# Patient Record
Sex: Female | Born: 1935 | Race: White | Hispanic: No | Smoking: Current every day smoker
Health system: Southern US, Community
[De-identification: ages and names within clinical notes are randomized; demographics above are authoritative.]

## PROBLEM LIST (undated history)

## (undated) DIAGNOSIS — D751 Secondary polycythemia: Secondary | ICD-10-CM

## (undated) DIAGNOSIS — M1990 Unspecified osteoarthritis, unspecified site: Secondary | ICD-10-CM

## (undated) DIAGNOSIS — E039 Hypothyroidism, unspecified: Secondary | ICD-10-CM

## (undated) DIAGNOSIS — E669 Obesity, unspecified: Secondary | ICD-10-CM

## (undated) DIAGNOSIS — I10 Essential (primary) hypertension: Secondary | ICD-10-CM

## (undated) DIAGNOSIS — E038 Other specified hypothyroidism: Secondary | ICD-10-CM

## (undated) DIAGNOSIS — G9050 Complex regional pain syndrome I, unspecified: Secondary | ICD-10-CM

## (undated) HISTORY — DX: Other specified hypothyroidism: E03.8

## (undated) HISTORY — DX: Secondary polycythemia: D75.1

## (undated) HISTORY — DX: Obesity, unspecified: E66.9

## (undated) HISTORY — DX: Complex regional pain syndrome I, unspecified: G90.50

## (undated) HISTORY — DX: Unspecified osteoarthritis, unspecified site: M19.90

## (undated) HISTORY — DX: Hypothyroidism, unspecified: E03.9

## (undated) HISTORY — DX: Essential (primary) hypertension: I10

---

## 2003-04-01 ENCOUNTER — Encounter: Payer: Self-pay | Admitting: Family Medicine

## 2003-04-01 ENCOUNTER — Ambulatory Visit (HOSPITAL_COMMUNITY): Admission: RE | Admit: 2003-04-01 | Discharge: 2003-04-01 | Payer: Self-pay | Admitting: Family Medicine

## 2003-09-19 LAB — HM COLONOSCOPY: HM Colonoscopy: NEGATIVE

## 2004-05-27 ENCOUNTER — Ambulatory Visit: Payer: Self-pay | Admitting: Family Medicine

## 2004-09-21 ENCOUNTER — Ambulatory Visit: Payer: Self-pay | Admitting: Family Medicine

## 2004-12-30 ENCOUNTER — Ambulatory Visit: Payer: Self-pay | Admitting: Family Medicine

## 2005-05-10 ENCOUNTER — Ambulatory Visit: Payer: Self-pay | Admitting: Family Medicine

## 2005-05-19 ENCOUNTER — Other Ambulatory Visit: Admission: RE | Admit: 2005-05-19 | Discharge: 2005-05-19 | Payer: Self-pay | Admitting: Dermatology

## 2005-06-14 ENCOUNTER — Ambulatory Visit: Payer: Self-pay | Admitting: Family Medicine

## 2005-09-16 ENCOUNTER — Ambulatory Visit: Payer: Self-pay | Admitting: Family Medicine

## 2005-10-19 ENCOUNTER — Ambulatory Visit: Payer: Self-pay | Admitting: Family Medicine

## 2005-11-18 ENCOUNTER — Ambulatory Visit: Payer: Self-pay | Admitting: Family Medicine

## 2006-02-16 ENCOUNTER — Ambulatory Visit: Payer: Self-pay | Admitting: Family Medicine

## 2006-03-08 ENCOUNTER — Ambulatory Visit: Payer: Self-pay | Admitting: Family Medicine

## 2006-05-15 ENCOUNTER — Ambulatory Visit: Payer: Self-pay | Admitting: Family Medicine

## 2006-11-10 ENCOUNTER — Encounter: Admission: RE | Admit: 2006-11-10 | Discharge: 2006-11-10 | Payer: Self-pay | Admitting: Rheumatology

## 2009-04-13 LAB — HM DEXA SCAN: HM Dexa Scan: NORMAL

## 2011-07-21 ENCOUNTER — Other Ambulatory Visit: Payer: Self-pay | Admitting: Family Medicine

## 2011-07-21 DIAGNOSIS — I10 Essential (primary) hypertension: Secondary | ICD-10-CM

## 2011-07-21 DIAGNOSIS — Z87891 Personal history of nicotine dependence: Secondary | ICD-10-CM

## 2011-07-26 ENCOUNTER — Ambulatory Visit
Admission: RE | Admit: 2011-07-26 | Discharge: 2011-07-26 | Disposition: A | Discharge status: Home / Self Care | Payer: Medicare Other | Source: Ambulatory Visit | Attending: Family Medicine | Admitting: Family Medicine

## 2011-07-26 DIAGNOSIS — Z87891 Personal history of nicotine dependence: Secondary | ICD-10-CM

## 2011-07-26 DIAGNOSIS — I10 Essential (primary) hypertension: Secondary | ICD-10-CM

## 2011-08-25 LAB — HM MAMMOGRAPHY

## 2012-08-16 DIAGNOSIS — G9050 Complex regional pain syndrome I, unspecified: Secondary | ICD-10-CM

## 2012-08-16 HISTORY — DX: Complex regional pain syndrome I, unspecified: G90.50

## 2012-09-18 ENCOUNTER — Encounter: Payer: Self-pay | Admitting: Family Medicine

## 2012-09-18 DIAGNOSIS — F17200 Nicotine dependence, unspecified, uncomplicated: Secondary | ICD-10-CM

## 2012-09-18 DIAGNOSIS — I10 Essential (primary) hypertension: Secondary | ICD-10-CM

## 2012-09-18 DIAGNOSIS — E669 Obesity, unspecified: Secondary | ICD-10-CM

## 2012-09-18 DIAGNOSIS — D751 Secondary polycythemia: Secondary | ICD-10-CM

## 2012-09-18 DIAGNOSIS — R7989 Other specified abnormal findings of blood chemistry: Secondary | ICD-10-CM

## 2012-09-18 DIAGNOSIS — M170 Bilateral primary osteoarthritis of knee: Secondary | ICD-10-CM

## 2012-09-18 DIAGNOSIS — G9050 Complex regional pain syndrome I, unspecified: Secondary | ICD-10-CM

## 2012-10-02 ENCOUNTER — Telehealth: Payer: Self-pay | Admitting: Family Medicine

## 2012-10-02 MED ORDER — MELOXICAM 15 MG PO TABS: 15.0000 mg | ORAL_TABLET | Freq: Every day | ORAL | Status: DC

## 2012-10-02 NOTE — Telephone Encounter (Signed)
Refilled 10/02/12 by Quentin Angst

## 2012-10-02 NOTE — Telephone Encounter (Signed)
Needs refill on mobic.  [facility]

## 2012-11-13 ENCOUNTER — Encounter: Payer: Self-pay | Admitting: Family Medicine

## 2012-11-13 ENCOUNTER — Ambulatory Visit (INDEPENDENT_AMBULATORY_CARE_PROVIDER_SITE_OTHER): Payer: Medicare Other | Admitting: Family Medicine

## 2012-11-13 VITALS — BP 141/71 | HR 57 | Temp 97.0°F | Wt 211.2 lb

## 2012-11-13 DIAGNOSIS — G5600 Carpal tunnel syndrome, unspecified upper limb: Secondary | ICD-10-CM

## 2012-11-13 DIAGNOSIS — G5601 Carpal tunnel syndrome, right upper limb: Secondary | ICD-10-CM

## 2012-11-13 DIAGNOSIS — I10 Essential (primary) hypertension: Secondary | ICD-10-CM

## 2012-11-13 DIAGNOSIS — M1710 Unilateral primary osteoarthritis, unspecified knee: Secondary | ICD-10-CM

## 2012-11-13 DIAGNOSIS — F329 Major depressive disorder, single episode, unspecified: Secondary | ICD-10-CM

## 2012-11-13 DIAGNOSIS — M170 Bilateral primary osteoarthritis of knee: Secondary | ICD-10-CM

## 2012-11-13 DIAGNOSIS — D751 Secondary polycythemia: Secondary | ICD-10-CM

## 2012-11-13 DIAGNOSIS — E669 Obesity, unspecified: Secondary | ICD-10-CM

## 2012-11-13 DIAGNOSIS — F3289 Other specified depressive episodes: Secondary | ICD-10-CM

## 2012-11-13 DIAGNOSIS — F17200 Nicotine dependence, unspecified, uncomplicated: Secondary | ICD-10-CM

## 2012-11-13 DIAGNOSIS — F32A Depression, unspecified: Secondary | ICD-10-CM | POA: Insufficient documentation

## 2012-11-13 LAB — POCT CBC
Granulocyte percent: 73.5 %G (ref 37–80)
HCT, POC: 48.2 % — AB (ref 37.7–47.9)
Hemoglobin: 15.9 g/dL (ref 12.2–16.2)
Lymph, poc: 3 (ref 0.6–3.4)
MCH, POC: 29.4 pg (ref 27–31.2)
MCHC: 33 g/dL (ref 31.8–35.4)
MCV: 89 fL (ref 80–97)
MPV: 8.1 fL (ref 0–99.8)
POC Granulocyte: 9.2 — AB (ref 2–6.9)
POC LYMPH PERCENT: 24.1 %L (ref 10–50)
Platelet Count, POC: 328 10*3/uL (ref 142–424)
RBC: 5.4 M/uL (ref 4.04–5.48)
RDW, POC: 14.4 %
WBC: 12.5 10*3/uL — AB (ref 4.6–10.2)

## 2012-11-13 LAB — COMPLETE METABOLIC PANEL WITH GFR
ALT: 12 U/L (ref 0–35)
AST: 12 U/L (ref 0–37)
Albumin: 4.1 g/dL (ref 3.5–5.2)
Alkaline Phosphatase: 86 U/L (ref 39–117)
BUN: 16 mg/dL (ref 6–23)
CO2: 25 mEq/L (ref 19–32)
Calcium: 10.5 mg/dL (ref 8.4–10.5)
Chloride: 101 mEq/L (ref 96–112)
Creat: 0.68 mg/dL (ref 0.50–1.10)
GFR, Est African American: >89 mL/min
GFR, Est Non African American: 86 mL/min
Glucose, Bld: 99 mg/dL (ref 70–99)
Potassium: 4.3 mEq/L (ref 3.5–5.3)
Sodium: 138 mEq/L (ref 135–145)
Total Bilirubin: 0.5 mg/dL (ref 0.3–1.2)
Total Protein: 6.8 g/dL (ref 6.0–8.3)

## 2012-11-13 NOTE — Patient Instructions (Signed)
Smoking Cessation Quitting smoking is important to your health and has many advantages. However, it is not always easy to quit since nicotine is a very addictive drug. Often times, people try 3 times or more before being able to quit. This document explains the best ways for you to prepare to quit smoking. Quitting takes hard work and a lot of effort, but you can do it. ADVANTAGES OF QUITTING SMOKING  You will live longer, feel better, and live better.  Your body will feel the impact of quitting smoking almost immediately.  Within 20 minutes, blood pressure decreases. Your pulse returns to its normal level.  After 8 hours, carbon monoxide levels in the blood return to normal. Your oxygen level increases.  After 24 hours, the chance of having a heart attack starts to decrease. Your breath, hair, and body stop smelling like smoke.  After 48 hours, damaged nerve endings begin to recover. Your sense of taste and smell improve.  After 72 hours, the body is virtually free of nicotine. Your bronchial tubes relax and breathing becomes easier.  After 2 to 12 weeks, lungs can hold more air. Exercise becomes easier and circulation improves.  The risk of having a heart attack, stroke, cancer, or lung disease is greatly reduced.  After 1 year, the risk of coronary heart disease is cut in half.  After 5 years, the risk of stroke falls to the same as a nonsmoker.  After 10 years, the risk of lung cancer is cut in half and the risk of other cancers decreases significantly.  After 15 years, the risk of coronary heart disease drops, usually to the level of a nonsmoker.  If you are pregnant, quitting smoking will improve your chances of having a healthy baby.  The people you live with, especially any children, will be healthier.  You will have extra money to spend on things other than cigarettes. QUESTIONS TO THINK ABOUT BEFORE ATTEMPTING TO QUIT You may want to talk about your answers with your  caregiver.  Why do you want to quit?  If you tried to quit in the past, what helped and what did not?  What will be the most difficult situations for you after you quit? How will you plan to handle them?  Who can help you through the tough times? Your family? Friends? A caregiver?  What pleasures do you get from smoking? What ways can you still get pleasure if you quit? Here are some questions to ask your caregiver:  How can you help me to be successful at quitting?  What medicine do you think would be best for me and how should I take it?  What should I do if I need more help?  What is smoking withdrawal like? How can I get information on withdrawal? GET READY  Set a quit date.  Change your environment by getting rid of all cigarettes, ashtrays, matches, and lighters in your home, car, or work. Do not let people smoke in your home.  Review your past attempts to quit. Think about what worked and what did not. GET SUPPORT AND ENCOURAGEMENT You have a better chance of being successful if you have help. You can get support in many ways.  Tell your family, friends, and co-workers that you are going to quit and need their support. Ask them not to smoke around you.  Get individual, group, or telephone counseling and support. Programs are available at local hospitals and health centers. Call your local health department for   information about programs in your area.  Spiritual beliefs and practices may help some smokers quit.  Download a "quit meter" on your computer to keep track of quit statistics, such as how long you have gone without smoking, cigarettes not smoked, and money saved.  Get a self-help book about quitting smoking and staying off of tobacco. LEARN NEW SKILLS AND BEHAVIORS  Distract yourself from urges to smoke. Talk to someone, go for a walk, or occupy your time with a task.  Change your normal routine. Take a different route to work. Drink tea instead of coffee.  Eat breakfast in a different place.  Reduce your stress. Take a hot bath, exercise, or read a book.  Plan something enjoyable to do every day. Reward yourself for not smoking.  Explore interactive web-based programs that specialize in helping you quit. GET MEDICINE AND USE IT CORRECTLY Medicines can help you stop smoking and decrease the urge to smoke. Combining medicine with the above behavioral methods and support can greatly increase your chances of successfully quitting smoking.  Nicotine replacement therapy helps deliver nicotine to your body without the negative effects and risks of smoking. Nicotine replacement therapy includes nicotine gum, lozenges, inhalers, nasal sprays, and skin patches. Some may be available over-the-counter and others require a prescription.  Antidepressant medicine helps people abstain from smoking, but how this works is unknown. This medicine is available by prescription.  Nicotinic receptor partial agonist medicine simulates the effect of nicotine in your brain. This medicine is available by prescription. Ask your caregiver for advice about which medicines to use and how to use them based on your health history. Your caregiver will tell you what side effects to look out for if you choose to be on a medicine or therapy. Carefully read the information on the package. Do not use any other product containing nicotine while using a nicotine replacement product.  RELAPSE OR DIFFICULT SITUATIONS Most relapses occur within the first 3 months after quitting. Do not be discouraged if you start smoking again. Remember, most people try several times before finally quitting. You may have symptoms of withdrawal because your body is used to nicotine. You may crave cigarettes, be irritable, feel very hungry, cough often, get headaches, or have difficulty concentrating. The withdrawal symptoms are only temporary. They are strongest when you first quit, but they will go away within  10 14 days. To reduce the chances of relapse, try to:  Avoid drinking alcohol. Drinking lowers your chances of successfully quitting.  Reduce the amount of caffeine you consume. Once you quit smoking, the amount of caffeine in your body increases and can give you symptoms, such as a rapid heartbeat, sweating, and anxiety.  Avoid smokers because they can make you want to smoke.  Do not let weight gain distract you. Many smokers will gain weight when they quit, usually less than 10 pounds. Eat a healthy diet and stay active. You can always lose the weight gained after you quit.  Find ways to improve your mood other than smoking. FOR MORE INFORMATION  www.smokefree.gov  Document Released: 06/21/2001 Document Revised: 12/27/2011 Document Reviewed: 10/06/2011 [employer] Patient Information 2013 [employer].  

## 2012-11-13 NOTE — Progress Notes (Signed)
Subjective:     Patient ID: Erin Ewing, female   DOB: 1936-04-11, 77 y.o.   MRN: [id number]  HPI Patient is here for follow up of hypertension: denies Headache;deniesChest Pain;denies weakness;denies Shortness of Breath or Orthopnea;denies Visual changes;denies palpitations;denies cough;denies pedal edema;denies symptoms of TIA or stroke; admits to Compliance with medications.  admits to Problems with medications.BP was too low and  Stopped the pm amlodipine.  Saw Dr Jordan Likes at pain management: Dx is NOT RSD. It is CTS. Had an injection into the right Carpal tunnel area and it is numb, less pain.  Sleeping a lot, Depressed. She was told to stop citalopram but needs to restart. Using lyrica.did not see any effect and stopped it 2 months ago.  Past Medical History  Diagnosis Date  . Reflex sympathetic dystrophy 08/16/2012  . Subclinical hypothyroidism   . Hypertension   . 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 Arthritis     kneew  . Obesity    No past surgical history on file. History   Social History  . Marital Status: Widowed    Spouse Name: N/A    Number of Children: N/A  . Years of Education: N/A   Occupational History  . Not on file.   Social History Main Topics  . Smoking status: Current Every Day Smoker -- 0.25 packs/day    Types: Cigarettes  . Smokeless tobacco: Not on file  . Alcohol Use: No  . Drug Use: No  . Sexually Active: Not on file   Other Topics Concern  . Not on file   Social History Narrative  . No narrative on file   Family History  Problem Relation Age of Onset  . Heart disease Father    Current Outpatient Prescriptions on File Prior to Visit  Medication Sig Dispense Refill  . amLODipine (NORVASC) 5 MG tablet Take 5 mg by mouth daily.      Marland Kitchen aspirin 81 MG tablet Take 81 mg by mouth daily.      . meloxicam (MOBIC) 15 MG tablet Take 1 tablet (15 mg total) by mouth daily.  30 tablet  2  . Multiple Vitamin (MULTIVITAMIN) tablet  Take 1 tablet by mouth daily.      . Multiple Vitamins-Minerals (PRESERVISION AREDS PO) Take 1 tablet by mouth daily.      Marland Kitchen olmesartan-hydrochlorothiazide (BENICAR HCT) 40-12.5 MG per tablet Take 1 tablet by mouth daily.      . pregabalin (LYRICA) 75 MG capsule Take 75 mg by mouth 2 (two) times daily.       No current facility-administered medications on file prior to visit.   No Known Allergies Immunization History  Administered Date(s) Administered  . Influenza Whole 04/10/2012  . Pneumococcal Polysaccharide 09/19/2006  . Zoster 07/20/2011   Prior to Admission medications   Medication Sig Start Date End Date Taking? Authorizing Provider  amLODipine (NORVASC) 5 MG tablet Take 5 mg by mouth daily. 09/18/12  Yes Historical Provider, MD  aspirin 81 MG tablet Take 81 mg by mouth daily.   Yes Historical Provider, MD  clonazePAM (KLONOPIN) 0.5 MG tablet Take 0.5 mg by mouth at bedtime as needed for anxiety.   Yes Historical Provider, MD  meloxicam (MOBIC) 15 MG tablet Take 1 tablet (15 mg total) by mouth daily. 10/02/12  Yes Ileana Ladd, MD  Multiple Vitamin (MULTIVITAMIN) tablet Take 1 tablet by mouth daily.   Yes Historical Provider, MD  Multiple Vitamins-Minerals (PRESERVISION AREDS PO) Take  1 tablet by mouth daily.   Yes Historical Provider, MD  olmesartan-hydrochlorothiazide (BENICAR HCT) 40-12.5 MG per tablet Take 1 tablet by mouth daily.   Yes Historical Provider, MD  pregabalin (LYRICA) 75 MG capsule Take 75 mg by mouth 2 (two) times daily.    Historical Provider, MD     Review of Systems    no other problems.  Objective:   Physical Exam APPEARANCE:  Patient in no acute distress.The patient appeared well nourished and normally developed. Acyanotic. Waist:obese VITAL SIGNS:BP 141/71  Pulse 57  Temp(Src) 97 F (36.1 C) (Oral)  Wt 211 lb 3.2 oz (95.8 kg)   SKIN: warm and  Dry without overt rashes, tattoos and scars  HEAD and Neck: without JVD, Head and scalp:  normal Eyes:No scleral icterus. Fundi normal, eye movements normal. Ears: Auricle normal, canal normal, Tympanic membranes normal, insufflation normal. Nose: normal Throat: normal Neck & thyroid: normal  CHEST & LUNGS: Chest wall: normal Lungs: Clear  CVS: Reveals the PMI to be normally located. Regular rhythm, First and Second Heart sounds are normal,  absence of murmurs, rubs or gallops. Peripheral vasculature: Radial pulses: normal Dorsal pedis pulses: normal Posterior pulses: normal  ABDOMEN:  Appearance:obese Benign,, no organomegaly, no masses, no Abdominal Aortic enlargement. No Guarding , no rebound. No Bruits. Bowel sounds: normal  RECTAL: N/A GU: N/A  EXTREMETIES: nonedematous. Both radiall and Pedal pulses are normal.  MUSCULOSKELETAL:  Spine: normal Joints: intact  NEUROLOGIC: oriented to time,place and person; nonfocal. Strength is normal Sensory isc/w with CTS on right.  Reflexes are normal Cranial Nerves are normal.     Assessment:      Tobacco use disorder  HTN (hypertension) - Plan: COMPLETE METABOLIC PANEL WITH GFR  Polycythemia, secondary - Plan: POCT CBC  Carpal tunnel syndrome, right  Depression  Obesity, unspecified - Plan: COMPLETE METABOLIC PANEL WITH GFR  Arthritis of both knees      Plan:     Restart the celexa . Smoking cessation counselled.  Supportive therapy, patient still grieving.  meds reviewed.  Orders Placed This Encounter  Procedures  . COMPLETE METABOLIC PANEL WITH GFR  . POCT CBC    RTC in 2 months.  Agree with pain management work up on CTS. She will be letting me know who to refer her to in [place] for  Surgery,  Maryla Morrow. [place] Charon, M.D.

## 2012-11-14 ENCOUNTER — Other Ambulatory Visit: Payer: Self-pay | Admitting: Family Medicine

## 2012-11-14 DIAGNOSIS — D72829 Elevated white blood cell count, unspecified: Secondary | ICD-10-CM

## 2012-11-14 NOTE — Progress Notes (Signed)
Quick Note:  Labs abnormal. WBC mildly elevated. This is most likely secondary to her recent steroid shot. I would like to recheck the CBC in 2 weeks. Ordered in [facility] today. ______

## 2013-01-25 ENCOUNTER — Ambulatory Visit (INDEPENDENT_AMBULATORY_CARE_PROVIDER_SITE_OTHER): Payer: Medicare Other | Admitting: Family Medicine

## 2013-01-25 ENCOUNTER — Encounter: Payer: Self-pay | Admitting: Family Medicine

## 2013-01-25 VITALS — BP 139/74 | HR 55 | Temp 97.4°F | Wt 208.4 lb

## 2013-01-25 DIAGNOSIS — F329 Major depressive disorder, single episode, unspecified: Secondary | ICD-10-CM

## 2013-01-25 DIAGNOSIS — E669 Obesity, unspecified: Secondary | ICD-10-CM

## 2013-01-25 DIAGNOSIS — D751 Secondary polycythemia: Secondary | ICD-10-CM

## 2013-01-25 DIAGNOSIS — M1710 Unilateral primary osteoarthritis, unspecified knee: Secondary | ICD-10-CM

## 2013-01-25 DIAGNOSIS — M25559 Pain in unspecified hip: Secondary | ICD-10-CM

## 2013-01-25 DIAGNOSIS — F3289 Other specified depressive episodes: Secondary | ICD-10-CM

## 2013-01-25 DIAGNOSIS — I10 Essential (primary) hypertension: Secondary | ICD-10-CM

## 2013-01-25 DIAGNOSIS — F17200 Nicotine dependence, unspecified, uncomplicated: Secondary | ICD-10-CM

## 2013-01-25 DIAGNOSIS — R946 Abnormal results of thyroid function studies: Secondary | ICD-10-CM

## 2013-01-25 DIAGNOSIS — G4700 Insomnia, unspecified: Secondary | ICD-10-CM

## 2013-01-25 DIAGNOSIS — F32A Depression, unspecified: Secondary | ICD-10-CM

## 2013-01-25 DIAGNOSIS — M170 Bilateral primary osteoarthritis of knee: Secondary | ICD-10-CM

## 2013-01-25 DIAGNOSIS — R7989 Other specified abnormal findings of blood chemistry: Secondary | ICD-10-CM

## 2013-01-25 LAB — COMPLETE METABOLIC PANEL WITH GFR
ALT: 15 U/L (ref 0–35)
AST: 12 U/L (ref 0–37)
Albumin: 4.3 g/dL (ref 3.5–5.2)
Alkaline Phosphatase: 81 U/L (ref 39–117)
BUN: 9 mg/dL (ref 6–23)
CO2: 27 mEq/L (ref 19–32)
Calcium: 9.9 mg/dL (ref 8.4–10.5)
Chloride: 100 mEq/L (ref 96–112)
Creat: 0.75 mg/dL (ref 0.50–1.10)
GFR, Est African American: >89 mL/min
GFR, Est Non African American: 79 mL/min
Glucose, Bld: 88 mg/dL (ref 70–99)
Potassium: 4.1 mEq/L (ref 3.5–5.3)
Sodium: 139 mEq/L (ref 135–145)
Total Bilirubin: 0.6 mg/dL (ref 0.3–1.2)
Total Protein: 6.5 g/dL (ref 6.0–8.3)

## 2013-01-25 LAB — THYROID PANEL WITH TSH
Free Thyroxine Index: 2.3 (ref 1.0–3.9)
T3 Uptake: 27.1 % (ref 22.5–37.0)
T4, Total: 8.6 ug/dL (ref 5.0–12.5)
TSH: 5.324 u[IU]/mL — ABNORMAL HIGH (ref 0.350–4.500)

## 2013-01-25 LAB — SEDIMENTATION RATE: Sed Rate: 1 mm/hr (ref 0–22)

## 2013-01-25 LAB — POCT CBC
Granulocyte percent: 74.2 %G (ref 37–80)
HCT, POC: 45.2 % (ref 37.7–47.9)
Hemoglobin: 16.4 g/dL — AB (ref 12.2–16.2)
Lymph, poc: 2.3 (ref 0.6–3.4)
MCH, POC: 32.3 pg — AB (ref 27–31.2)
MCHC: 36.2 g/dL — AB (ref 31.8–35.4)
MCV: 89.2 fL (ref 80–97)
MPV: 8.2 fL (ref 0–99.8)
POC Granulocyte: 8 — AB (ref 2–6.9)
POC LYMPH PERCENT: 21.2 %L (ref 10–50)
Platelet Count, POC: 291 10*3/uL (ref 142–424)
RBC: 5.1 M/uL (ref 4.04–5.48)
RDW, POC: 14.7 %
WBC: 10.8 10*3/uL — AB (ref 4.6–10.2)

## 2013-01-25 MED ORDER — CELECOXIB 200 MG PO CAPS: 200.0000 mg | ORAL_CAPSULE | Freq: Every day | ORAL | Status: DC

## 2013-01-25 MED ORDER — PREDNISONE 20 MG PO TABS: 40.0000 mg | ORAL_TABLET | Freq: Every day | ORAL | Status: DC

## 2013-01-25 MED ORDER — CLONAZEPAM 0.5 MG PO TABS: 0.5000 mg | ORAL_TABLET | Freq: Every evening | ORAL | Status: DC | PRN

## 2013-01-25 NOTE — Progress Notes (Signed)
Patient ID: Erin Ewing, female   DOB: January 09, 1936, 77 y.o.   MRN: [id number] SUBJECTIVE: CC: Chief Complaint  Patient presents with  . Follow-up    2 month follow up    HPI: Patient is here for follow up of hypertension: denies Headache;deniesChest Pain;denies weakness;denies Shortness of Breath or Orthopnea;denies Visual changes;denies palpitations;denies cough;denies pedal edema;denies symptoms of TIA or stroke; admits to Compliance with medications. denies Problems with medications.  Still grieving a lot and to make matters worse her dog died as well. Coping though.  Wrist and fingers of the right hand doing so much better  Past Medical History  Diagnosis Date  . Reflex sympathetic dystrophy 08/16/2012  . Subclinical hypothyroidism   . Hypertension   . 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 Arthritis     kneew  . Obesity    No past surgical history on file. History   Social History  . Marital Status: Widowed    Spouse Name: N/A    Number of Children: N/A  . Years of Education: N/A   Occupational History  . Not on file.   Social History Main Topics  . Smoking status: Current Every Day Smoker -- 0.25 packs/day    Types: Cigarettes  . Smokeless tobacco: Not on file  . Alcohol Use: No  . Drug Use: No  . Sexually Active: Not on file   Other Topics Concern  . Not on file   Social History Narrative  . No narrative on file   Family History  Problem Relation Age of Onset  . Heart disease Father    Current Outpatient Prescriptions on File Prior to Visit  Medication Sig Dispense Refill  . amLODipine (NORVASC) 5 MG tablet Take 5 mg by mouth daily.      Marland Kitchen aspirin 81 MG tablet Take 81 mg by mouth daily.      . clonazePAM (KLONOPIN) 0.5 MG tablet Take 0.5 mg by mouth at bedtime as needed for anxiety.      . meloxicam (MOBIC) 15 MG tablet Take 1 tablet (15 mg total) by mouth daily.  30 tablet  2  . Multiple Vitamin (MULTIVITAMIN) tablet Take 1 tablet by  mouth daily.      . Multiple Vitamins-Minerals (PRESERVISION AREDS PO) Take 1 tablet by mouth daily.      Marland Kitchen olmesartan-hydrochlorothiazide (BENICAR HCT) 40-12.5 MG per tablet Take 1 tablet by mouth daily.      . pregabalin (LYRICA) 75 MG capsule Take 75 mg by mouth 2 (two) times daily.       No current facility-administered medications on file prior to visit.   No Known Allergies Immunization History  Administered Date(s) Administered  . Influenza Whole 04/10/2012  . Pneumococcal Polysaccharide 09/19/2006  . Zoster 07/20/2011   Prior to Admission medications   Medication Sig Start Date End Date Taking? Authorizing Provider  amLODipine (NORVASC) 5 MG tablet Take 5 mg by mouth daily. 09/18/12  Yes Historical Provider, MD  aspirin 81 MG tablet Take 81 mg by mouth daily.   Yes Historical Provider, MD  clonazePAM (KLONOPIN) 0.5 MG tablet Take 0.5 mg by mouth at bedtime as needed for anxiety.   Yes Historical Provider, MD  meloxicam (MOBIC) 15 MG tablet Take 1 tablet (15 mg total) by mouth daily. 10/02/12  Yes Ileana Ladd, MD  Multiple Vitamin (MULTIVITAMIN) tablet Take 1 tablet by mouth daily.   Yes Historical Provider, MD  Multiple Vitamins-Minerals (PRESERVISION AREDS PO) Take 1  tablet by mouth daily.   Yes Historical Provider, MD  olmesartan-hydrochlorothiazide (BENICAR HCT) 40-12.5 MG per tablet Take 1 tablet by mouth daily.   Yes Historical Provider, MD  pregabalin (LYRICA) 75 MG capsule Take 75 mg by mouth 2 (two) times daily.   Yes Historical Provider, MD     ROS: As above in the HPI. All other systems are stable or negative.  OBJECTIVE: APPEARANCE:  Patient in no acute distress.The patient appeared well nourished and normally developed. Acyanotic. Waist: VITAL SIGNS:BP 139/74  Pulse 55  Temp(Src) 97.4 F (36.3 C) (Oral)  Wt 208 lb 6.4 oz (94.53 kg) WF Obese  SKIN: warm and  Dry without overt rashes, tattoos and scars  HEAD and Neck: without JVD, Head and scalp:  normal Eyes:No scleral icterus. Fundi normal, eye movements normal. Ears: Auricle normal, canal normal, Tympanic membranes normal, insufflation normal. Nose: normal Throat: normal Neck & thyroid: normal  CHEST & LUNGS: Chest wall: normal Lungs: Clear  CVS: Reveals the PMI to be normally located. Regular rhythm, First and Second Heart sounds are normal,  absence of murmurs, rubs or gallops. Peripheral vasculature: Radial pulses: normal Dorsal pedis pulses: normal Posterior pulses: normal  ABDOMEN:  Appearance: normal Benign, no organomegaly, no masses, no Abdominal Aortic enlargement. No Guarding , no rebound. No Bruits. Bowel sounds: normal  RECTAL: N/A GU: N/A  EXTREMETIES: nonedematous. Both Femoral and Pedal pulses are normal.  MUSCULOSKELETAL:  Spine: normal Joints: intact  NEUROLOGIC: oriented to time,place and person; nonfocal. Strength is normal Sensory is normal Reflexes are normal Cranial Nerves are normal.  ASSESSMENT: HTN (hypertension) - Plan: COMPLETE METABOLIC PANEL WITH GFR  Tobacco use disorder  Obesity, unspecified  Arthritis of both knees - Plan: celecoxib (CELEBREX) 200 MG capsule  Abnormal thyroid blood test  Depression  Polycythemia, secondary  Chronic arthralgias of knees and hips, unspecified laterality - Plan: POCT CBC, Thyroid Panel With TSH, Sedimentation rate, predniSONE (DELTASONE) 20 MG tablet  Insomnia - Plan: clonazePAM (KLONOPIN) 0.5 MG tablet  PLAN: Orders Placed This Encounter  Procedures  . Thyroid Panel With TSH  . COMPLETE METABOLIC PANEL WITH GFR  . Sedimentation rate  . POCT CBC   Meds ordered this encounter  Medications  . citalopram (CELEXA) 20 MG tablet    Sig: Take 40 mg by mouth daily.  . clonazePAM (KLONOPIN) 0.5 MG tablet    Sig: Take 1 tablet (0.5 mg total) by mouth at bedtime as needed for anxiety.    Dispense:  30 tablet    Refill:  1  . predniSONE (DELTASONE) 20 MG tablet    Sig: Take 2  tablets (40 mg total) by mouth daily. For 3 days then 1 tablet daily for 3 days, then 1/2 tablet for 4 days.    Dispense:  11 tablet    Refill:  0  . celecoxib (CELEBREX) 200 MG capsule    Sig: Take 1 capsule (200 mg total) by mouth daily.    Dispense:  30 capsule    Refill:  2   Hold the celebrex until after complete the prednisone.  Supportive therapy.  Return in about 2 months (around 03/28/2013).  Clarke Peretz P. [place] Charon, M.D.

## 2013-01-26 ENCOUNTER — Other Ambulatory Visit: Payer: Self-pay | Admitting: Family Medicine

## 2013-01-26 DIAGNOSIS — R899 Unspecified abnormal finding in specimens from other organs, systems and tissues: Secondary | ICD-10-CM

## 2013-01-26 NOTE — Progress Notes (Signed)
Quick Note:  Labs abnormal.the TSH is borderline hypothyroid. Will need to repeat in 2 months. The rest was fine.  ______

## 2013-02-08 ENCOUNTER — Telehealth: Payer: Self-pay | Admitting: Family Medicine

## 2013-02-08 NOTE — Telephone Encounter (Signed)
Left message on voice mail and if questions could call on Saturday morning early for nurse to answer questions

## 2013-02-11 NOTE — Telephone Encounter (Signed)
Per dr [place] Charon take 1/2 tablet for 2 weeks then 1/4 tablets for 4 weeks then ov Pt verbalized understanding

## 2013-04-05 ENCOUNTER — Ambulatory Visit (INDEPENDENT_AMBULATORY_CARE_PROVIDER_SITE_OTHER): Payer: Medicare Other | Admitting: Family Medicine

## 2013-04-05 ENCOUNTER — Encounter: Payer: Self-pay | Admitting: Family Medicine

## 2013-04-05 VITALS — BP 155/81 | HR 59 | Temp 97.3°F | Wt 215.2 lb

## 2013-04-05 DIAGNOSIS — G4700 Insomnia, unspecified: Secondary | ICD-10-CM

## 2013-04-05 DIAGNOSIS — M170 Bilateral primary osteoarthritis of knee: Secondary | ICD-10-CM

## 2013-04-05 DIAGNOSIS — E669 Obesity, unspecified: Secondary | ICD-10-CM

## 2013-04-05 DIAGNOSIS — I10 Essential (primary) hypertension: Secondary | ICD-10-CM

## 2013-04-05 DIAGNOSIS — D751 Secondary polycythemia: Secondary | ICD-10-CM

## 2013-04-05 DIAGNOSIS — G5600 Carpal tunnel syndrome, unspecified upper limb: Secondary | ICD-10-CM

## 2013-04-05 DIAGNOSIS — F3289 Other specified depressive episodes: Secondary | ICD-10-CM

## 2013-04-05 DIAGNOSIS — M1710 Unilateral primary osteoarthritis, unspecified knee: Secondary | ICD-10-CM

## 2013-04-05 DIAGNOSIS — F32A Depression, unspecified: Secondary | ICD-10-CM

## 2013-04-05 DIAGNOSIS — R946 Abnormal results of thyroid function studies: Secondary | ICD-10-CM

## 2013-04-05 DIAGNOSIS — R635 Abnormal weight gain: Secondary | ICD-10-CM

## 2013-04-05 DIAGNOSIS — F17200 Nicotine dependence, unspecified, uncomplicated: Secondary | ICD-10-CM

## 2013-04-05 DIAGNOSIS — R7989 Other specified abnormal findings of blood chemistry: Secondary | ICD-10-CM

## 2013-04-05 DIAGNOSIS — F329 Major depressive disorder, single episode, unspecified: Secondary | ICD-10-CM

## 2013-04-05 LAB — POCT CBC
Granulocyte percent: 67.9 %G (ref 37–80)
HCT, POC: 49.2 % — AB (ref 37.7–47.9)
Hemoglobin: 16.3 g/dL — AB (ref 12.2–16.2)
Lymph, poc: 2.4 (ref 0.6–3.4)
MCH, POC: 29.4 pg (ref 27–31.2)
MCHC: 33 g/dL (ref 31.8–35.4)
MCV: 89.1 fL (ref 80–97)
MPV: 8.1 fL (ref 0–99.8)
POC Granulocyte: 6.2 (ref 2–6.9)
POC LYMPH PERCENT: 26.4 %L (ref 10–50)
Platelet Count, POC: 264 10*3/uL (ref 142–424)
RBC: 5.5 M/uL — AB (ref 4.04–5.48)
RDW, POC: 14.6 %
WBC: 9.1 10*3/uL (ref 4.6–10.2)

## 2013-04-05 MED ORDER — AMLODIPINE BESYLATE 5 MG PO TABS: 5.0000 mg | ORAL_TABLET | Freq: Every day | ORAL | Status: DC

## 2013-04-05 MED ORDER — OLMESARTAN MEDOXOMIL-HCTZ 40-12.5 MG PO TABS: 1.0000 | ORAL_TABLET | Freq: Every day | ORAL | Status: DC

## 2013-04-05 MED ORDER — CLONAZEPAM 0.5 MG PO TABS: 0.5000 mg | ORAL_TABLET | Freq: Every evening | ORAL | Status: DC | PRN

## 2013-04-05 MED ORDER — CELECOXIB 200 MG PO CAPS: 200.0000 mg | ORAL_CAPSULE | Freq: Every day | ORAL | Status: DC

## 2013-04-05 NOTE — Progress Notes (Signed)
Patient ID: Erin Ewing, female   DOB: [birth year], 77 y.o.   MRN: [id number] SUBJECTIVE: CC: Chief Complaint  Patient presents with  . Follow-up    2 month follow up     HPI: Patient is here for follow up of hypertension: denies Headache;deniesChest Pain;denies weakness;denies Shortness of Breath or Orthopnea;denies Visual changes;denies palpitations;denies cough;denies pedal edema;denies symptoms of TIA or stroke; admits to Compliance with medications. denies Problems with medications. Still grieving , but better. Her sisters husband is dying and her 2 girlfriends are widowed. [place] 4 of [place]. Had her grief counseling with hospice. She says that her Dx is not RSD. That her final Dx is CTS. This is doing well.   Past Medical History  Diagnosis Date  . Reflex sympathetic dystrophy 08/16/2012  . Subclinical hypothyroidism   . Hypertension   . 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 Arthritis     kneew  . Obesity    No past surgical history on file. History   Social History  . Marital Status: Widowed    Spouse Name: N/A    Number of Children: N/A  . Years of Education: N/A   Occupational History  . Not on file.   Social History Main Topics  . Smoking status: Current Every Day Smoker -- 0.25 packs/day    Types: Cigarettes  . Smokeless tobacco: Not on file  . Alcohol Use: No  . Drug Use: No  . Sexual Activity: Not on file   Other Topics Concern  . Not on file   Social History Narrative  . No narrative on file   Family History  Problem Relation Age of Onset  . Heart disease Father    Current Outpatient Prescriptions on File Prior to Visit  Medication Sig Dispense Refill  . amLODipine (NORVASC) 5 MG tablet Take 5 mg by mouth daily.      Marland Kitchen aspirin 81 MG tablet Take 81 mg by mouth daily.      . celecoxib (CELEBREX) 200 MG capsule Take 1 capsule (200 mg total) by mouth daily.  30 capsule  2  . citalopram  (CELEXA) 20 MG tablet Take 40 mg by mouth daily.      . clonazePAM (KLONOPIN) 0.5 MG tablet Take 1 tablet (0.5 mg total) by mouth at bedtime as needed for anxiety.  30 tablet  1  . Multiple Vitamin (MULTIVITAMIN) tablet Take 1 tablet by mouth daily.      . Multiple Vitamins-Minerals (PRESERVISION AREDS PO) Take 1 tablet by mouth daily.      Marland Kitchen olmesartan-hydrochlorothiazide (BENICAR HCT) 40-12.5 MG per tablet Take 1 tablet by mouth daily.      . predniSONE (DELTASONE) 20 MG tablet Take 2 tablets (40 mg total) by mouth daily. For 3 days then 1 tablet daily for 3 days, then 1/2 tablet for 4 days.  11 tablet  0  . pregabalin (LYRICA) 75 MG capsule Take 75 mg by mouth 2 (two) times daily.       No current facility-administered medications on file prior to visit.   No Known Allergies Immunization History  Administered Date(s) Administered  . Influenza Whole 04/10/2012  . Pneumococcal Polysaccharide 09/19/2006  . Zoster 07/20/2011   Prior to Admission medications   Medication Sig Start Date End Date Taking? Authorizing Provider  amLODipine (NORVASC) 5 MG tablet Take 5 mg by mouth daily. 09/18/12  Yes Historical Provider, MD  aspirin 81 MG  tablet Take 81 mg by mouth daily.   Yes Historical Provider, MD  celecoxib (CELEBREX) 200 MG capsule Take 1 capsule (200 mg total) by mouth daily. 01/25/13  Yes Ileana Ladd, MD  citalopram (CELEXA) 20 MG tablet Take 40 mg by mouth daily.   Yes Historical Provider, MD  clonazePAM (KLONOPIN) 0.5 MG tablet Take 1 tablet (0.5 mg total) by mouth at bedtime as needed for anxiety. 01/25/13  Yes Ileana Ladd, MD  Multiple Vitamin (MULTIVITAMIN) tablet Take 1 tablet by mouth daily.   Yes Historical Provider, MD  Multiple Vitamins-Minerals (PRESERVISION AREDS PO) Take 1 tablet by mouth daily.   Yes Historical Provider, MD  olmesartan-hydrochlorothiazide (BENICAR HCT) 40-12.5 MG per tablet Take 1 tablet by mouth daily.   Yes Historical Provider, MD  predniSONE  (DELTASONE) 20 MG tablet Take 2 tablets (40 mg total) by mouth daily. For 3 days then 1 tablet daily for 3 days, then 1/2 tablet for 4 days. 01/25/13  Yes Ileana Ladd, MD  pregabalin (LYRICA) 75 MG capsule Take 75 mg by mouth 2 (two) times daily.   Yes Historical Provider, MD    ROS: As above in the HPI. All other systems are stable or negative.  OBJECTIVE: APPEARANCE:  Patient in no acute distress.The patient appeared well nourished and normally developed. Acyanotic. Waist: VITAL SIGNS:BP 155/81  Pulse 59  Temp(Src) 97.3 F (36.3 C) (Oral)  Wt 215 lb 3.2 oz (97.614 kg) WF Obese recheck BP 140/80  SKIN: warm and  Dry without overt rashes, tattoos and scars  HEAD and Neck: without JVD, Head and scalp: normal Eyes:No scleral icterus. Fundi normal, eye movements normal. Ears: Auricle normal, canal normal, Tympanic membranes normal, insufflation normal. Nose: normal Throat: normal Neck & thyroid: normal  CHEST & LUNGS: Chest wall: normal Lungs: Clear  CVS: Reveals the PMI to be normally located. Regular rhythm, First and Second Heart sounds are normal,  absence of murmurs, rubs or gallops. Peripheral vasculature: Radial pulses: normal Dorsal pedis pulses: normal Posterior pulses: normal  ABDOMEN:  Appearance: Obese Benign, no organomegaly, no masses, no Abdominal Aortic enlargement. No Guarding , no rebound. No Bruits. Bowel sounds: normal  RECTAL: N/A GU: N/A  EXTREMETIES: nonedematous.  MUSCULOSKELETAL:  Spine: normal Joints: intact  NEUROLOGIC: oriented to time,place and person; nonfocal. Strength is normal Sensory is normal Reflexes are normal Cranial Nerves are normal.  Results for orders placed in visit on 01/25/13  THYROID PANEL WITH TSH      Result Value Range   T4, Total 8.6  5.0 - 12.5 ug/dL   T3 Uptake 24.4  01.0 - 37.0 %   Free Thyroxine Index 2.3  1.0 - 3.9   TSH 5.324 (*) 0.350 - 4.500 uIU/mL  COMPLETE METABOLIC PANEL WITH GFR       Result Value Range   Sodium 139  135 - 145 mEq/L   Potassium 4.1  3.5 - 5.3 mEq/L   Chloride 100  96 - 112 mEq/L   CO2 27  19 - 32 mEq/L   Glucose, Bld 88  70 - 99 mg/dL   BUN 9  6 - 23 mg/dL   Creat 2.72  5.36 - 6.44 mg/dL   Total Bilirubin 0.6  0.3 - 1.2 mg/dL   Alkaline Phosphatase 81  39 - 117 U/L   AST 12  0 - 37 U/L   ALT 15  0 - 35 U/L   Total Protein 6.5  6.0 - 8.3 g/dL   Albumin  4.3  3.5 - 5.2 g/dL   Calcium 9.9  8.4 - 96.0 mg/dL   GFR, Est African American >89     GFR, Est Non African American 79    SEDIMENTATION RATE      Result Value Range   Sed Rate 1  0 - 22 mm/hr  POCT CBC      Result Value Range   WBC 10.8 (*) 4.6 - 10.2 K/uL   Lymph, poc 2.3  0.6 - 3.4   POC LYMPH PERCENT 21.2  10 - 50 %L   MID (cbc)    0 - 0.9   POC MID %    0 - 12 %M   POC Granulocyte 8.0 (*) 2 - 6.9   Granulocyte percent 74.2  37 - 80 %G   RBC 5.1  4.04 - 5.48 M/uL   Hemoglobin 16.4 (*) 12.2 - 16.2 g/dL   HCT, POC 45.4  09.8 - 47.9 %   MCV 89.2  80 - 97 fL   MCH, POC 32.3 (*) 27 - 31.2 pg   MCHC 36.2 (*) 31.8 - 35.4 g/dL   RDW, POC 11.9     Platelet Count, POC 291.0  142 - 424 K/uL   MPV 8.2  0 - 99.8 fL    ASSESSMENT: HTN (hypertension) - Plan: olmesartan-hydrochlorothiazide (BENICAR HCT) 40-12.5 MG per tablet, amLODipine (NORVASC) 5 MG tablet, CMP14+EGFR  Polycythemia, secondary - Plan: POCT CBC  Tobacco use disorder  Abnormal thyroid blood test - Plan: Thyroid Panel With TSH  Arthritis of both knees - Plan: celecoxib (CELEBREX) 200 MG capsule, CMP14+EGFR  Obesity, unspecified  Carpal tunnel syndrome, unspecified laterality  Depression  Insomnia - Plan: clonazePAM (KLONOPIN) 0.5 MG tablet   PLAN: Orders Placed This Encounter  Procedures  . CMP14+EGFR  . Thyroid Panel With TSH  . POCT CBC    Meds ordered this encounter  Medications  . clonazePAM (KLONOPIN) 0.5 MG tablet    Sig: Take 1 tablet (0.5 mg total) by mouth at bedtime as needed for anxiety.     Dispense:  30 tablet    Refill:  1  . olmesartan-hydrochlorothiazide (BENICAR HCT) 40-12.5 MG per tablet    Sig: Take 1 tablet by mouth daily.    Dispense:  30 tablet    Refill:  5  . amLODipine (NORVASC) 5 MG tablet    Sig: Take 1 tablet (5 mg total) by mouth daily.    Dispense:  30 tablet    Refill:  5  . celecoxib (CELEBREX) 200 MG capsule    Sig: Take 1 capsule (200 mg total) by mouth daily.    Dispense:  30 capsule    Refill:  2   Grief counseling Supportive therapy. Patient is doing well and actually looks better. Smoking cessation counseling Coupon for celebrex given.  Return in about 3 months (around 07/05/2013) for Recheck medical problems.  Clio Gerhart P. [place]esto Charon, M.D.

## 2013-04-06 LAB — THYROID PANEL WITH TSH
Free Thyroxine Index: 1.8 (ref 1.2–4.9)
T3 Uptake Ratio: 27 % (ref 24–39)
T4, Total: 6.5 ug/dL (ref 4.5–12.0)
TSH: 7.24 u[IU]/mL — ABNORMAL HIGH (ref 0.450–4.500)

## 2013-04-06 LAB — CMP14+EGFR
ALT: 13 IU/L (ref 0–32)
AST: 13 IU/L (ref 0–40)
Albumin/Globulin Ratio: 2 (ref 1.1–2.5)
Albumin: 4.2 g/dL (ref 3.5–4.8)
Alkaline Phosphatase: 85 IU/L (ref 39–117)
BUN/Creatinine Ratio: 17 (ref 11–26)
BUN: 11 mg/dL (ref 8–27)
CO2: 23 mmol/L (ref 18–29)
Calcium: 10.3 mg/dL — ABNORMAL HIGH (ref 8.6–10.2)
Chloride: 102 mmol/L (ref 97–108)
Creatinine, Ser: 0.63 mg/dL (ref 0.57–1.00)
GFR calc Af Amer: 102 mL/min/{1.73_m2} (ref >59)
GFR calc non Af Amer: 89 mL/min/{1.73_m2} (ref >59)
Globulin, Total: 2.1 g/dL (ref 1.5–4.5)
Glucose: 89 mg/dL (ref 65–99)
Potassium: 4.4 mmol/L (ref 3.5–5.2)
Sodium: 141 mmol/L (ref 134–144)
Total Bilirubin: 0.4 mg/dL (ref 0.0–1.2)
Total Protein: 6.3 g/dL (ref 6.0–8.5)

## 2013-04-08 ENCOUNTER — Other Ambulatory Visit: Payer: Self-pay | Admitting: Family Medicine

## 2013-04-08 DIAGNOSIS — E039 Hypothyroidism, unspecified: Secondary | ICD-10-CM

## 2013-04-08 MED ORDER — LEVOTHYROXINE SODIUM 25 MCG PO TABS: 25.0000 ug | ORAL_TABLET | Freq: Every day | ORAL | Status: DC

## 2013-04-08 NOTE — Progress Notes (Signed)
Quick Note:  Labs abnormal. Subclinical hypothyroidism. Needs to be on a low dose thyroid medication. Need to recheck thyroid in 6 weeks. OV. ______

## 2013-04-29 ENCOUNTER — Ambulatory Visit (INDEPENDENT_AMBULATORY_CARE_PROVIDER_SITE_OTHER): Payer: Medicare Other

## 2013-04-29 DIAGNOSIS — Z23 Encounter for immunization: Secondary | ICD-10-CM

## 2013-07-16 ENCOUNTER — Ambulatory Visit (INDEPENDENT_AMBULATORY_CARE_PROVIDER_SITE_OTHER): Payer: Medicare Other | Admitting: Family Medicine

## 2013-07-16 ENCOUNTER — Encounter: Payer: Self-pay | Admitting: Family Medicine

## 2013-07-16 VITALS — BP 149/84 | HR 64 | Temp 97.3°F | Ht 65.5 in | Wt 217.6 lb

## 2013-07-16 DIAGNOSIS — R7989 Other specified abnormal findings of blood chemistry: Secondary | ICD-10-CM

## 2013-07-16 DIAGNOSIS — D751 Secondary polycythemia: Secondary | ICD-10-CM

## 2013-07-16 DIAGNOSIS — G4700 Insomnia, unspecified: Secondary | ICD-10-CM

## 2013-07-16 DIAGNOSIS — M1710 Unilateral primary osteoarthritis, unspecified knee: Secondary | ICD-10-CM

## 2013-07-16 DIAGNOSIS — M170 Bilateral primary osteoarthritis of knee: Secondary | ICD-10-CM

## 2013-07-16 DIAGNOSIS — F32A Depression, unspecified: Secondary | ICD-10-CM

## 2013-07-16 DIAGNOSIS — G5600 Carpal tunnel syndrome, unspecified upper limb: Secondary | ICD-10-CM

## 2013-07-16 DIAGNOSIS — F329 Major depressive disorder, single episode, unspecified: Secondary | ICD-10-CM

## 2013-07-16 DIAGNOSIS — E669 Obesity, unspecified: Secondary | ICD-10-CM

## 2013-07-16 DIAGNOSIS — F17200 Nicotine dependence, unspecified, uncomplicated: Secondary | ICD-10-CM

## 2013-07-16 DIAGNOSIS — R946 Abnormal results of thyroid function studies: Secondary | ICD-10-CM

## 2013-07-16 DIAGNOSIS — F3289 Other specified depressive episodes: Secondary | ICD-10-CM

## 2013-07-16 DIAGNOSIS — Z23 Encounter for immunization: Secondary | ICD-10-CM

## 2013-07-16 DIAGNOSIS — IMO0002 Reserved for concepts with insufficient information to code with codable children: Secondary | ICD-10-CM

## 2013-07-16 DIAGNOSIS — I10 Essential (primary) hypertension: Secondary | ICD-10-CM

## 2013-07-16 LAB — POCT CBC
Granulocyte percent: 66.1 %G (ref 37–80)
HCT, POC: 51.1 % — AB (ref 37.7–47.9)
Hemoglobin: 15.9 g/dL (ref 12.2–16.2)
Lymph, poc: 2.6 (ref 0.6–3.4)
MCH, POC: 28.7 pg (ref 27–31.2)
MCHC: 31.2 g/dL — AB (ref 31.8–35.4)
MCV: 91.9 fL (ref 80–97)
MPV: 8.2 fL (ref 0–99.8)
POC Granulocyte: 5.9 (ref 2–6.9)
POC LYMPH PERCENT: 28.8 %L (ref 10–50)
Platelet Count, POC: 289 10*3/uL (ref 142–424)
RBC: 5.6 M/uL — AB (ref 4.04–5.48)
RDW, POC: 14.2 %
WBC: 8.9 10*3/uL (ref 4.6–10.2)

## 2013-07-16 MED ORDER — CLONAZEPAM 0.5 MG PO TABS: 0.5000 mg | ORAL_TABLET | Freq: Every evening | ORAL | Status: DC | PRN

## 2013-07-16 NOTE — Patient Instructions (Addendum)
Try OTC arthritis  Strength tylenol.       Dr Woodroe ModeFrancis Versie Soave's Recommendations  For nutrition information, I recommend books:  1).Eat to Live by Dr Monico HoarJoel Fuhrman. 2).Prevent and Reverse Heart Disease by Dr Suzzette Righteraldwell Esselstyn. 3) Dr Katherina RightNeal Barnard's Book:  Program to Reverse Diabetes  Exercise recommendations are:  If unable to walk, then the patient can exercise in a chair 3 times a day. By flapping arms like a bird gently and raising legs outwards to the front.  If ambulatory, the patient can go for walks for 30 minutes 3 times a week. Then increase the intensity and duration as tolerated.  Goal is to try to attain exercise frequency to 5 times a week.  If applicable: Best to perform resistance exercises (machines or weights) 2 days a week and cardio type exercises 3 days per week. Pneumococcal Conjugate Vaccine What You Need to Know Your doctor recommends that you, or your child, get a dose of PCV13 vaccine today. WHY GET VACCINATED? Pneumococcal conjugate vaccine (called PCV13 or Prevnar 13) is recommended to protect infants and toddlers, and some older children and adults with certain health conditions, from pneumococcal disease. Pneumococcal disease is caused by infection with Streptococcus pneumoniae bacteria. These bacteria can spread from person to person through close contact. Pneumococcal disease can lead to severe health problems, including pneumonia, blood infections, and meningitis. Meningitis is an infection of the covering of the brain. Pneumococcal meningitis is fairly rare (less than 1 case per 100,000 people each year), but it leads to other health problems, including deafness and brain damage. In children, it is fatal in about 1 case out of 10. Children younger than two are at higher risk for serious disease than older children. People with certain medical conditions, people over age 76, and cigarette smokers are also at higher risk. Before vaccine, pneumococcal infections  caused many problems each year in the [place] in children younger than 5, including:  more than 700 cases of meningitis,  13,000 blood infections,  about 5 million ear infections, and  about 200 deaths. About 4,000 adults still die each year because of pneumococcal infections. Pneumococcal infections can be hard to treat because some strains are resistant to antibiotics. This makes prevention through vaccination even more important. PCV13 VACCINE There are more than 90 types of pneumococcal bacteria. PCV13 protects against 13 of them. These 13 strains cause most severe infections in children and about half of infections in adults.  PCV13 is routinely given to children at 2, 4, 6, and 4712 2715 months of age. Children in this age range are at greatest risk for serious diseases caused by pneumococcal infection. PCV13 vaccine may also be recommended for some older children or adults. Your doctor can give you details. A second type of pneumococcal vaccine, called PPSV23, may also be given to some children and adults, including anyone over age 76. There is a separate Vaccine Information Statement for this vaccine. PRECAUTIONS  Anyone who has ever had a life-threatening allergic reaction to a dose of this vaccine, to an earlier pneumococcal vaccine called PCV7 (or Prevnar), or to any vaccine containing diphtheria toxoid (for example, DTaP), should not get PCV13. Anyone with a severe allergy to any component of PCV13 should not get the vaccine. Tell your doctor if the person being vaccinated has any severe allergies. If the person scheduled for vaccination is sick, your doctor might decide to reschedule the shot on another day. Your doctor can give you more information about any  of these precautions. RISKS  With any medicine, including vaccines, there is a chance of side effects. These are usually mild and go away on their own, but serious reactions are also possible. Reported problems associated  with PCV13 vary by dose and age, but generally:  About half of children became drowsy after the shot, had a temporary loss of appetite, or had redness or tenderness where the shot was given.  About 1 out of 3 had swelling where the shot was given.  About 1 out of 3 had a mild fever, and about 1 in 20 had a higher fever (over 102.2 F or 39 C).  Up to about 8 out of 10 became fussy or irritable. Adults receiving the vaccine have reported redness, pain, and swelling where the shot was given. Mild fever, fatigue, headache, chills, or muscle pain have also been reported. Life-threatening allergic reactions from any vaccine are very rare. WHAT IF THERE IS A SERIOUS REACTION? What should I look for? Look for anything that concerns you, such as signs of a severe allergic reaction, very high fever, or behavior changes. Signs of a severe allergic reaction can include hives, swelling of the face and throat, difficulty breathing, a fast heartbeat, dizziness, and weakness. These would start a few minutes to a few hours after the vaccination. What should I do?  If you think it is a severe allergic reaction or other emergency that can't wait, get the person to the nearest hospital or call 9-1-1. Otherwise, call your doctor.  Afterward, the reaction should be reported to the "Vaccine Adverse Event Reporting System" (VAERS). Your doctor might file this report, or you can do it yourself through the VAERS web site at www.vaers.LAgents.no, or by calling 1-812-454-3059. VAERS is only for reporting reactions. They do not give medical advice. THE NATIONAL VACCINE INJURY COMPENSATION PROGRAM The National Vaccine Injury Compensation Program (VICP) was created in 1986. Persons who believe they may have been injured by a vaccine can learn about the program and about filing a claim by calling 1-573-196-9938 or visiting the VICP website at SpiritualWord.at. HOW CAN I LEARN MORE?  Ask your doctor.  Call  your local or state health department.  Contact the Centers for Disease Control and Prevention (CDC):  Call 917-619-5874 (1-800-CDC-INFO) or  Visit CDC's website at PicCapture.uy CDC PCV13 Vaccine VIS (Interim) (09/07/11) Document Released: 04/24/2006 Document Revised: 10/22/2012 Document Reviewed: 10/17/2012 [facility] Patient Information 2014 [place], [place].

## 2013-07-16 NOTE — Progress Notes (Signed)
Patient ID: Erin Ewing, female   DOB: 1935-08-12, 78 y.o.   MRN: [id number] SUBJECTIVE: CC: Chief Complaint  Patient presents with  . Follow-up    3 month refill clonazepam     HPI: Patient is here for follow up of hypertension/thyroid  Dis/obesity/smoker: denies Headache;deniesChest Pain;denies weakness;denies Shortness of Breath or Orthopnea;denies Visual changes;denies palpitations;denies cough;denies pedal edema;denies symptoms of TIA or stroke; admits to Compliance with medications. denies Problems with medications.  Here for follow up on depression and grieving. Doing well Stopped the celebrex it wasn't helping as much for the price.  Clonazepam helps at night.to sleep.  Planning to relocate to the [place] with her sister.   Past Medical History  Diagnosis Date  . Reflex sympathetic dystrophy 08/16/2012  . Subclinical hypothyroidism   . Hypertension   . 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 Arthritis     kneew  . Obesity    No past surgical history on file. History   Social History  . Marital Status: Widowed    Spouse Name: N/A    Number of Children: N/A  . Years of Education: N/A   Occupational History  . Not on file.   Social History Main Topics  . Smoking status: Current Every Day Smoker -- 0.25 packs/day    Types: Cigarettes  . Smokeless tobacco: Not on file  . Alcohol Use: No  . Drug Use: No  . Sexual Activity: Not on file   Other Topics Concern  . Not on file   Social History Narrative  . No narrative on file   Family History  Problem Relation Age of Onset  . Heart disease Father    Current Outpatient Prescriptions on File Prior to Visit  Medication Sig Dispense Refill  . amLODipine (NORVASC) 5 MG tablet Take 1 tablet (5 mg total) by mouth daily.  30 tablet  5  . aspirin 81 MG tablet Take 81 mg by mouth daily.      Marland Kitchen levothyroxine (SYNTHROID, LEVOTHROID) 25 MCG tablet Take 1 tablet (25 mcg total) by mouth  daily before breakfast.  30 tablet  3  . olmesartan-hydrochlorothiazide (BENICAR HCT) 40-12.5 MG per tablet Take 1 tablet by mouth daily.  30 tablet  5   No current facility-administered medications on file prior to visit.   No Known Allergies Immunization History  Administered Date(s) Administered  . Influenza Whole 04/10/2012  . Influenza,inj,Quad PF,36+ Mos 04/29/2013  . Pneumococcal Conjugate-13 07/16/2013  . Pneumococcal Polysaccharide-23 09/19/2006  . Zoster 07/20/2011   Prior to Admission medications   Medication Sig Start Date End Date Taking? Authorizing Provider  amLODipine (NORVASC) 5 MG tablet Take 1 tablet (5 mg total) by mouth daily. 04/05/13  Yes Vernie Shanks, MD  aspirin 81 MG tablet Take 81 mg by mouth daily.   Yes Historical Provider, MD  clonazePAM (KLONOPIN) 0.5 MG tablet Take 1 tablet (0.5 mg total) by mouth at bedtime as needed for anxiety. 04/05/13  Yes Vernie Shanks, MD  levothyroxine (SYNTHROID, LEVOTHROID) 25 MCG tablet Take 1 tablet (25 mcg total) by mouth daily before breakfast. 04/08/13  Yes Vernie Shanks, MD  olmesartan-hydrochlorothiazide (BENICAR HCT) 40-12.5 MG per tablet Take 1 tablet by mouth daily. 04/05/13  Yes Vernie Shanks, MD  celecoxib (CELEBREX) 200 MG capsule Take 1 capsule (200 mg total) by mouth daily. 04/05/13   Vernie Shanks, MD     ROS: As above in the HPI.  All other systems are stable or negative.  OBJECTIVE: APPEARANCE:  Patient in no acute distress.The patient appeared well nourished and normally developed. Acyanotic. Waist: VITAL SIGNS:BP 149/84  Pulse 64  Temp(Src) 97.3 F (36.3 C) (Oral)  Ht 5' 5.5" (1.664 m)  Wt 217 lb 9.6 oz (98.703 kg)  BMI 35.65 kg/m2 Obese WF  SKIN: warm and  Dry without overt rashes, tattoos and scars  HEAD and Neck: without JVD, Head and scalp: normal Eyes:No scleral icterus. Fundi normal, eye movements normal. Ears: Auricle normal, canal normal, Tympanic membranes normal, insufflation  normal. Nose: normal Throat: normal Neck & thyroid: normal  CHEST & LUNGS: Chest wall: normal Lungs: Clear  CVS: Reveals the PMI to be normally located. Regular rhythm, First and Second Heart sounds are normal,  absence of murmurs, rubs or gallops. Peripheral vasculature: Radial pulses: normal Dorsal pedis pulses: normal Posterior pulses: normal  ABDOMEN:  Appearance: normal Benign, no organomegaly, no masses, no Abdominal Aortic enlargement. No Guarding , no rebound. No Bruits. Bowel sounds: normal  RECTAL: N/A GU: N/A  EXTREMETIES: nonedematous.  MUSCULOSKELETAL:  Spine: normal Joints: intact  NEUROLOGIC: oriented to time,place and person; nonfocal. Strength is normal Sensory is normal Reflexes are normal Cranial Nerves are normal.  ASSESSMENT:  HTN (hypertension) - Plan: BMP8+EGFR  Depression  Polycythemia, secondary - Plan: POCT CBC, BMP8+EGFR  Tobacco use disorder  Abnormal thyroid blood test - Plan: TSH  Arthritis of both knees - Plan: Vit D  25 hydroxy (rtn osteoporosis monitoring)  Obesity, unspecified  Carpal tunnel syndrome, unspecified laterality  Insomnia - Plan: clonazePAM (KLONOPIN) 0.5 MG tablet  Need for pneumococcal vaccination - Plan: Pneumococcal conjugate vaccine 13-valent  PLAN: Try OTC arthritis  Strength tylenol. For her arthritis.       Dr Paula Libra Recommendations  For nutrition information, I recommend books:  1).Eat to Live by Dr Excell Seltzer. 2).Prevent and Reverse Heart Disease by Dr Karl Luke. 3) Dr Janene Harvey Book:  Program to Reverse Diabetes  Exercise recommendations are:  If unable to walk, then the patient can exercise in a chair 3 times a day. By flapping arms like a bird gently and raising legs outwards to the front.  If ambulatory, the patient can go for walks for 30 minutes 3 times a week. Then increase the intensity and duration as tolerated.  Goal is to try to attain exercise  frequency to 5 times a week.  If applicable: Best to perform resistance exercises (machines or weights) 2 days a week and cardio type exercises 3 days per week.  Orders Placed This Encounter  Procedures  . Pneumococcal conjugate vaccine 13-valent  . TSH  . Vit D  25 hydroxy (rtn osteoporosis monitoring)  . BMP8+EGFR  . POCT CBC   Meds ordered this encounter  Medications  . clonazePAM (KLONOPIN) 0.5 MG tablet    Sig: Take 1 tablet (0.5 mg total) by mouth at bedtime as needed for anxiety.    Dispense:  30 tablet    Refill:  2   Medications Discontinued During This Encounter  Medication Reason  . celecoxib (CELEBREX) 200 MG capsule Patient has not taken in last 30 days  . clonazePAM (KLONOPIN) 0.5 MG tablet Reorder  counseled on smoking cessation. Return in about 2 months (around 09/13/2013) for Recheck medical problems.  Aryaan Persichetti P. Jacelyn Grip, M.D.

## 2013-07-16 NOTE — Progress Notes (Signed)
Tolerated prevnar without difficulty

## 2013-07-17 ENCOUNTER — Other Ambulatory Visit: Payer: Self-pay | Admitting: Family Medicine

## 2013-07-17 DIAGNOSIS — E875 Hyperkalemia: Secondary | ICD-10-CM

## 2013-07-17 DIAGNOSIS — E039 Hypothyroidism, unspecified: Secondary | ICD-10-CM

## 2013-07-17 LAB — VITAMIN D 25 HYDROXY (VIT D DEFICIENCY, FRACTURES): Vit D, 25-Hydroxy: 19 ng/mL — ABNORMAL LOW (ref 30.0–100.0)

## 2013-07-17 LAB — BMP8+EGFR
BUN/Creatinine Ratio: 13 (ref 11–26)
BUN: 10 mg/dL (ref 8–27)
CO2: 24 mmol/L (ref 18–29)
Calcium: 10.2 mg/dL (ref 8.6–10.2)
Chloride: 102 mmol/L (ref 97–108)
Creatinine, Ser: 0.77 mg/dL (ref 0.57–1.00)
GFR calc Af Amer: 87 mL/min/{1.73_m2} (ref >59)
GFR calc non Af Amer: 76 mL/min/{1.73_m2} (ref >59)
Glucose: 106 mg/dL — ABNORMAL HIGH (ref 65–99)
Potassium: 5.6 mmol/L — ABNORMAL HIGH (ref 3.5–5.2)
Sodium: 142 mmol/L (ref 134–144)

## 2013-07-17 LAB — TSH: TSH: 22.97 u[IU]/mL — ABNORMAL HIGH (ref 0.450–4.500)

## 2013-07-17 MED ORDER — LEVOTHYROXINE SODIUM 50 MCG PO TABS: 50.0000 ug | ORAL_TABLET | Freq: Every day | ORAL | Status: DC

## 2013-08-19 ENCOUNTER — Encounter: Payer: Self-pay | Admitting: *Deleted

## 2013-08-31 ENCOUNTER — Other Ambulatory Visit: Payer: Self-pay | Admitting: Family Medicine

## 2013-09-09 ENCOUNTER — Encounter: Payer: Self-pay | Admitting: Family Medicine

## 2013-09-09 ENCOUNTER — Ambulatory Visit (INDEPENDENT_AMBULATORY_CARE_PROVIDER_SITE_OTHER): Payer: Medicare Other | Admitting: Family Medicine

## 2013-09-09 VITALS — BP 132/75 | HR 65 | Temp 97.8°F | Ht 65.5 in | Wt 214.8 lb

## 2013-09-09 DIAGNOSIS — G4700 Insomnia, unspecified: Secondary | ICD-10-CM | POA: Insufficient documentation

## 2013-09-09 DIAGNOSIS — E559 Vitamin D deficiency, unspecified: Secondary | ICD-10-CM

## 2013-09-09 DIAGNOSIS — F17200 Nicotine dependence, unspecified, uncomplicated: Secondary | ICD-10-CM

## 2013-09-09 DIAGNOSIS — M1990 Unspecified osteoarthritis, unspecified site: Secondary | ICD-10-CM

## 2013-09-09 DIAGNOSIS — F32A Depression, unspecified: Secondary | ICD-10-CM

## 2013-09-09 DIAGNOSIS — Z23 Encounter for immunization: Secondary | ICD-10-CM | POA: Insufficient documentation

## 2013-09-09 DIAGNOSIS — E039 Hypothyroidism, unspecified: Secondary | ICD-10-CM | POA: Insufficient documentation

## 2013-09-09 DIAGNOSIS — D751 Secondary polycythemia: Secondary | ICD-10-CM

## 2013-09-09 DIAGNOSIS — R7989 Other specified abnormal findings of blood chemistry: Secondary | ICD-10-CM

## 2013-09-09 DIAGNOSIS — I10 Essential (primary) hypertension: Secondary | ICD-10-CM

## 2013-09-09 DIAGNOSIS — M129 Arthropathy, unspecified: Secondary | ICD-10-CM

## 2013-09-09 DIAGNOSIS — F3289 Other specified depressive episodes: Secondary | ICD-10-CM

## 2013-09-09 DIAGNOSIS — R946 Abnormal results of thyroid function studies: Secondary | ICD-10-CM

## 2013-09-09 DIAGNOSIS — E669 Obesity, unspecified: Secondary | ICD-10-CM

## 2013-09-09 DIAGNOSIS — F329 Major depressive disorder, single episode, unspecified: Secondary | ICD-10-CM

## 2013-09-09 MED ORDER — TRAMADOL HCL 50 MG PO TABS: 50.0000 mg | ORAL_TABLET | Freq: Three times a day (TID) | ORAL | Status: DC | PRN

## 2013-09-09 MED ORDER — AMLODIPINE BESYLATE 5 MG PO TABS: 5.0000 mg | ORAL_TABLET | Freq: Every day | ORAL | Status: DC

## 2013-09-09 MED ORDER — OLMESARTAN MEDOXOMIL-HCTZ 40-12.5 MG PO TABS: 1.0000 | ORAL_TABLET | Freq: Every day | ORAL | Status: DC

## 2013-09-09 MED ORDER — LEVOTHYROXINE SODIUM 50 MCG PO TABS: 50.0000 ug | ORAL_TABLET | Freq: Every day | ORAL | Status: DC

## 2013-09-09 MED ORDER — CLONAZEPAM 0.5 MG PO TABS: 0.5000 mg | ORAL_TABLET | Freq: Every evening | ORAL | Status: DC | PRN

## 2013-09-09 NOTE — Patient Instructions (Signed)
Smoking Cessation Quitting smoking is important to your health and has many advantages. However, it is not always easy to quit since nicotine is a very addictive drug. Often times, people try 3 times or more before being able to quit. This document explains the best ways for you to prepare to quit smoking. Quitting takes hard work and a lot of effort, but you can do it. ADVANTAGES OF QUITTING SMOKING  You will live longer, feel better, and live better.  Your body will feel the impact of quitting smoking almost immediately.  Within 20 minutes, blood pressure decreases. Your pulse returns to its normal level.  After 8 hours, carbon monoxide levels in the blood return to normal. Your oxygen level increases.  After 24 hours, the chance of having a heart attack starts to decrease. Your breath, hair, and body stop smelling like smoke.  After 48 hours, damaged nerve endings begin to recover. Your sense of taste and smell improve.  After 72 hours, the body is virtually free of nicotine. Your bronchial tubes relax and breathing becomes easier.  After 2 to 12 weeks, lungs can hold more air. Exercise becomes easier and circulation improves.  The risk of having a heart attack, stroke, cancer, or lung disease is greatly reduced.  After 1 year, the risk of coronary heart disease is cut in half.  After 5 years, the risk of stroke falls to the same as a nonsmoker.  After 10 years, the risk of lung cancer is cut in half and the risk of other cancers decreases significantly.  After 15 years, the risk of coronary heart disease drops, usually to the level of a nonsmoker.  If you are pregnant, quitting smoking will improve your chances of having a healthy baby.  The people you live with, especially any children, will be healthier.  You will have extra money to spend on things other than cigarettes. QUESTIONS TO THINK ABOUT BEFORE ATTEMPTING TO QUIT You may want to talk about your answers with your  caregiver.  Why do you want to quit?  If you tried to quit in the past, what helped and what did not?  What will be the most difficult situations for you after you quit? How will you plan to handle them?  Who can help you through the tough times? Your family? Friends? A caregiver?  What pleasures do you get from smoking? What ways can you still get pleasure if you quit? Here are some questions to ask your caregiver:  How can you help me to be successful at quitting?  What medicine do you think would be best for me and how should I take it?  What should I do if I need more help?  What is smoking withdrawal like? How can I get information on withdrawal? GET READY  Set a quit date.  Change your environment by getting rid of all cigarettes, ashtrays, matches, and lighters in your home, car, or work. Do not let people smoke in your home.  Review your past attempts to quit. Think about what worked and what did not. GET SUPPORT AND ENCOURAGEMENT You have a better chance of being successful if you have help. You can get support in many ways.  Tell your family, friends, and co-workers that you are going to quit and need their support. Ask them not to smoke around you.  Get individual, group, or telephone counseling and support. Programs are available at local hospitals and health centers. Call your local health department for   information about programs in your area.  Spiritual beliefs and practices may help some smokers quit.  Download a "quit meter" on your computer to keep track of quit statistics, such as how long you have gone without smoking, cigarettes not smoked, and money saved.  Get a self-help book about quitting smoking and staying off of tobacco. LEARN NEW SKILLS AND BEHAVIORS  Distract yourself from urges to smoke. Talk to someone, go for a walk, or occupy your time with a task.  Change your normal routine. Take a different route to work. Drink tea instead of coffee.  Eat breakfast in a different place.  Reduce your stress. Take a hot bath, exercise, or read a book.  Plan something enjoyable to do every day. Reward yourself for not smoking.  Explore interactive web-based programs that specialize in helping you quit. GET MEDICINE AND USE IT CORRECTLY Medicines can help you stop smoking and decrease the urge to smoke. Combining medicine with the above behavioral methods and support can greatly increase your chances of successfully quitting smoking.  Nicotine replacement therapy helps deliver nicotine to your body without the negative effects and risks of smoking. Nicotine replacement therapy includes nicotine gum, lozenges, inhalers, nasal sprays, and skin patches. Some may be available over-the-counter and others require a prescription.  Antidepressant medicine helps people abstain from smoking, but how this works is unknown. This medicine is available by prescription.  Nicotinic receptor partial agonist medicine simulates the effect of nicotine in your brain. This medicine is available by prescription. Ask your caregiver for advice about which medicines to use and how to use them based on your health history. Your caregiver will tell you what side effects to look out for if you choose to be on a medicine or therapy. Carefully read the information on the package. Do not use any other product containing nicotine while using a nicotine replacement product.  RELAPSE OR DIFFICULT SITUATIONS Most relapses occur within the first 3 months after quitting. Do not be discouraged if you start smoking again. Remember, most people try several times before finally quitting. You may have symptoms of withdrawal because your body is used to nicotine. You may crave cigarettes, be irritable, feel very hungry, cough often, get headaches, or have difficulty concentrating. The withdrawal symptoms are only temporary. They are strongest when you first quit, but they will go away within  10 14 days. To reduce the chances of relapse, try to:  Avoid drinking alcohol. Drinking lowers your chances of successfully quitting.  Reduce the amount of caffeine you consume. Once you quit smoking, the amount of caffeine in your body increases and can give you symptoms, such as a rapid heartbeat, sweating, and anxiety.  Avoid smokers because they can make you want to smoke.  Do not let weight gain distract you. Many smokers will gain weight when they quit, usually less than 10 pounds. Eat a healthy diet and stay active. You can always lose the weight gained after you quit.  Find ways to improve your mood other than smoking. FOR MORE INFORMATION  www.smokefree.gov  Document Released: 06/21/2001 Document Revised: 12/27/2011 Document Reviewed: 10/06/2011 [employer] Patient Information 2014 [employer].        Dr Eileen Croswell's Recommendations  For nutrition information, I recommend books:  1).Eat to Live by Dr Joel Fuhrman. 2).Prevent and Reverse Heart Disease by Dr Caldwell Esselstyn. 3) Dr Neal Barnard's Book:  Program to Reverse Diabetes  Exercise recommendations are:  If unable to walk, then the patient can   exercise in a chair 3 times a day. By flapping arms like a bird gently and raising legs outwards to the front.  If ambulatory, the patient can go for walks for 30 minutes 3 times a week. Then increase the intensity and duration as tolerated.  Goal is to try to attain exercise frequency to 5 times a week.  If applicable: Best to perform resistance exercises (machines or weights) 2 days a week and cardio type exercises 3 days per week.  

## 2013-09-09 NOTE — Progress Notes (Signed)
Patient ID: Erin Ewing, female   DOB: Mar 30, 1936, 78 y.o.   MRN: [id number] SUBJECTIVE: CC: Chief Complaint  Patient presents with  . 2 month recheck  . Hypertension  . Medication Refill  . Hypothyroidism    HPI: Patient is here for follow up of hypertension/obesity/polycythemia/tobacco use/Vit D Def/arthritis of knees.: denies Headache;deniesChest Pain;denies weakness;denies Shortness of Breath or Orthopnea;denies Visual changes;denies palpitations;denies cough;denies pedal edema;denies symptoms of TIA or stroke; admits to Compliance with medications. denies Problems with medications.  Weight not better due to poor diet and busy preparing to move.  Past Medical History  Diagnosis Date  . Reflex sympathetic dystrophy 08/16/2012  . Subclinical hypothyroidism   . Hypertension   . 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 Arthritis     kneew  . Obesity    History reviewed. No pertinent past surgical history. History   Social History  . Marital Status: Widowed    Spouse Name: N/A    Number of Children: N/A  . Years of Education: N/A   Occupational History  . Not on file.   Social History Main Topics  . Smoking status: Current Every Day Smoker -- 0.25 packs/day    Types: Cigarettes  . Smokeless tobacco: Not on file  . Alcohol Use: No  . Drug Use: No  . Sexual Activity: Not on file   Other Topics Concern  . Not on file   Social History Narrative  . No narrative on file   Family History  Problem Relation Age of Onset  . Heart disease Father    Current Outpatient Prescriptions on File Prior to Visit  Medication Sig Dispense Refill  . aspirin 81 MG tablet Take 81 mg by mouth daily.       No current facility-administered medications on file prior to visit.   No Known Allergies Immunization History  Administered Date(s) Administered  . Influenza Whole 04/10/2012  . Influenza,inj,Quad PF,36+ Mos 04/29/2013  . Pneumococcal Conjugate-13 07/16/2013  .  Pneumococcal Polysaccharide-23 09/19/2006  . Tdap 09/09/2013  . Zoster 07/20/2011   Prior to Admission medications   Medication Sig Start Date End Date Taking? Authorizing Provider  amLODipine (NORVASC) 5 MG tablet Take 1 tablet (5 mg total) by mouth daily. 04/05/13  Yes Vernie Shanks, MD  aspirin 81 MG tablet Take 81 mg by mouth daily.   Yes Historical Provider, MD  clonazePAM (KLONOPIN) 0.5 MG tablet Take 1 tablet (0.5 mg total) by mouth at bedtime as needed for anxiety. 07/16/13  Yes Vernie Shanks, MD  levothyroxine (SYNTHROID, LEVOTHROID) 50 MCG tablet Take 1 tablet (50 mcg total) by mouth daily before breakfast. 07/17/13  Yes Vernie Shanks, MD  olmesartan-hydrochlorothiazide (BENICAR HCT) 40-12.5 MG per tablet Take 1 tablet by mouth daily. 04/05/13  Yes Vernie Shanks, MD     ROS: As above in the HPI. All other systems are stable or negative.  OBJECTIVE: APPEARANCE:  Patient in no acute distress.The patient appeared well nourished and normally developed. Acyanotic. Waist: VITAL SIGNS:BP 132/75  Pulse 65  Temp(Src) 97.8 F (36.6 C) (Oral)  Ht 5' 5.5" (1.664 m)  Wt 214 lb 12.8 oz (97.433 kg)  BMI 35.19 kg/m2  Obese WF  SKIN: warm and  Dry without overt rashes, tattoos and scars  HEAD and Neck: without JVD, Head and scalp: normal Eyes:No scleral icterus. Fundi normal, eye movements normal. Ears: Auricle normal, canal normal, Tympanic membranes normal, insufflation normal. Nose: normal Throat: normal Neck &  thyroid: normal  CHEST & LUNGS: Chest wall: normal Lungs: Clear  CVS: Reveals the PMI to be normally located. Regular rhythm, First and Second Heart sounds are normal,  absence of murmurs, rubs or gallops. Peripheral vasculature: Radial pulses: normal Dorsal pedis pulses: normal Posterior pulses: normal  ABDOMEN:  Appearance: Obese Benign, no organomegaly, no masses, no Abdominal Aortic enlargement. No Guarding , no rebound. No Bruits. Bowel sounds:  normal  RECTAL: N/A GU: N/A  EXTREMETIES: nonedematous.  MUSCULOSKELETAL:  Spine: reduced ROM Joints: Knees crepitus and arthritic. Gait slow and cautious.  NEUROLOGIC: oriented to time,place and person; nonfocal. Strength is normal Sensory is normal Reflexes are normal Cranial Nerves are normal.  ASSESSMENT:  HTN (hypertension) - Plan: BMP8+EGFR, amLODipine (NORVASC) 5 MG tablet, olmesartan-hydrochlorothiazide (BENICAR HCT) 40-12.5 MG per tablet  Need for Tdap vaccination - Plan: Tdap vaccine greater than or equal to 7yo IM  Tobacco use disorder  Polycythemia, secondary  Depression  Obesity, unspecified  Abnormal thyroid blood test - Plan: TSH  Unspecified vitamin D deficiency - Plan: Vit D  25 hydroxy (rtn osteoporosis monitoring)  Insomnia - Plan: clonazePAM (KLONOPIN) 0.5 MG tablet  Hypothyroid - Plan: levothyroxine (SYNTHROID, LEVOTHROID) 50 MCG tablet  Arthritis - Plan: traMADol (ULTRAM) 50 MG tablet  PLAN:  Orders Placed This Encounter  Procedures  . Tdap vaccine greater than or equal to 7yo IM  . BMP8+EGFR  . Vit D  25 hydroxy (rtn osteoporosis monitoring)  . TSH   Meds ordered this encounter  Medications  . traMADol (ULTRAM) 50 MG tablet    Sig: Take 1 tablet (50 mg total) by mouth every 8 (eight) hours as needed for moderate pain.    Dispense:  30 tablet    Refill:  0  . amLODipine (NORVASC) 5 MG tablet    Sig: Take 1 tablet (5 mg total) by mouth daily.    Dispense:  30 tablet    Refill:  5  . clonazePAM (KLONOPIN) 0.5 MG tablet    Sig: Take 1 tablet (0.5 mg total) by mouth at bedtime as needed for anxiety.    Dispense:  30 tablet    Refill:  2  . levothyroxine (SYNTHROID, LEVOTHROID) 50 MCG tablet    Sig: Take 1 tablet (50 mcg total) by mouth daily before breakfast.    Dispense:  30 tablet    Refill:  5  . olmesartan-hydrochlorothiazide (BENICAR HCT) 40-12.5 MG per tablet    Sig: Take 1 tablet by mouth daily.    Dispense:  30 tablet     Refill:  5   Medications Discontinued During This Encounter  Medication Reason  . amLODipine (NORVASC) 5 MG tablet Reorder  . clonazePAM (KLONOPIN) 0.5 MG tablet Reorder  . levothyroxine (SYNTHROID, LEVOTHROID) 50 MCG tablet Reorder  . olmesartan-hydrochlorothiazide (BENICAR HCT) 40-12.5 MG per tablet Reorder  smoking cessation counseling for 7 minutes. Handout in the AVS.       Dr Paula Libra Recommendations  For nutrition information, I recommend books:  1).Eat to Live by Dr Excell Seltzer. 2).Prevent and Reverse Heart Disease by Dr Karl Luke. 3) Dr Janene Harvey Book:  Program to Reverse Diabetes  Exercise recommendations are:  If unable to walk, then the patient can exercise in a chair 3 times a day. By flapping arms like a bird gently and raising legs outwards to the front.  If ambulatory, the patient can go for walks for 30 minutes 3 times a week. Then increase the intensity and duration as tolerated.  Goal is to try to attain exercise frequency to 5 times a week.  If applicable: Best to perform resistance exercises (machines or weights) 2 days a week and cardio type exercises 3 days per week.  Wished her well on her move. Recommended Dr Tye Savoy from the [facility] in the area she is moving to. Return if symptoms worsen or fail to improve, for Recheck medical problems.  Caylin Nass P. Jacelyn Grip, M.D. Jerilynn Mages

## 2013-09-10 LAB — BMP8+EGFR
BUN/Creatinine Ratio: 18 (ref 11–26)
BUN: 14 mg/dL (ref 8–27)
CO2: 21 mmol/L (ref 18–29)
Calcium: 10.2 mg/dL (ref 8.7–10.3)
Chloride: 97 mmol/L (ref 97–108)
Creatinine, Ser: 0.77 mg/dL (ref 0.57–1.00)
GFR calc Af Amer: 87 mL/min/{1.73_m2} (ref >59)
GFR calc non Af Amer: 76 mL/min/{1.73_m2} (ref >59)
Glucose: 91 mg/dL (ref 65–99)
Potassium: 4.3 mmol/L (ref 3.5–5.2)
Sodium: 138 mmol/L (ref 134–144)

## 2013-09-10 LAB — TSH: TSH: 15.4 u[IU]/mL — ABNORMAL HIGH (ref 0.450–4.500)

## 2013-09-10 LAB — VITAMIN D 25 HYDROXY (VIT D DEFICIENCY, FRACTURES): Vit D, 25-Hydroxy: 14.7 ng/mL — ABNORMAL LOW (ref 30.0–100.0)

## 2013-09-11 ENCOUNTER — Other Ambulatory Visit: Payer: Self-pay | Admitting: Family Medicine

## 2013-09-11 DIAGNOSIS — E039 Hypothyroidism, unspecified: Secondary | ICD-10-CM

## 2013-09-11 MED ORDER — LEVOTHYROXINE SODIUM 75 MCG PO TABS: 75.0000 ug | ORAL_TABLET | Freq: Every day | ORAL | Status: DC

## 2013-09-13 ENCOUNTER — Ambulatory Visit: Payer: Medicare Other | Admitting: Family Medicine

## 2013-10-01 ENCOUNTER — Telehealth: Payer: Self-pay | Admitting: Family Medicine

## 2013-10-01 ENCOUNTER — Other Ambulatory Visit: Payer: Self-pay | Admitting: Family Medicine

## 2013-10-01 DIAGNOSIS — E039 Hypothyroidism, unspecified: Secondary | ICD-10-CM

## 2013-10-01 DIAGNOSIS — I10 Essential (primary) hypertension: Secondary | ICD-10-CM

## 2013-10-01 MED ORDER — AMLODIPINE BESYLATE 5 MG PO TABS: 5.0000 mg | ORAL_TABLET | Freq: Every day | ORAL | Status: DC

## 2013-10-01 MED ORDER — LEVOTHYROXINE SODIUM 75 MCG PO TABS: 75.0000 ug | ORAL_TABLET | Freq: Every day | ORAL | Status: DC

## 2013-10-01 MED ORDER — OLMESARTAN MEDOXOMIL-HCTZ 40-12.5 MG PO TABS: 1.0000 | ORAL_TABLET | Freq: Every day | ORAL | Status: DC

## 2013-10-01 NOTE — Telephone Encounter (Signed)
Spoke with pt and she is leaving April 10 and she is requesting rx on all meds for 90 days and will pick up at office tomorrow Erin Ewing(wednesday) October 02 2013

## 2013-10-01 NOTE — Telephone Encounter (Signed)
Rx ready for pick up. 

## 2014-09-23 ENCOUNTER — Ambulatory Visit (INDEPENDENT_AMBULATORY_CARE_PROVIDER_SITE_OTHER): Payer: Medicare Other | Admitting: Family Medicine

## 2014-09-23 ENCOUNTER — Encounter: Payer: Self-pay | Admitting: Family Medicine

## 2014-09-23 VITALS — BP 114/70 | HR 62 | Temp 97.0°F | Ht 65.5 in | Wt 212.0 lb

## 2014-09-23 DIAGNOSIS — E559 Vitamin D deficiency, unspecified: Secondary | ICD-10-CM | POA: Diagnosis not present

## 2014-09-23 DIAGNOSIS — M1990 Unspecified osteoarthritis, unspecified site: Secondary | ICD-10-CM

## 2014-09-23 DIAGNOSIS — E039 Hypothyroidism, unspecified: Secondary | ICD-10-CM | POA: Diagnosis not present

## 2014-09-23 DIAGNOSIS — I10 Essential (primary) hypertension: Secondary | ICD-10-CM | POA: Diagnosis not present

## 2014-09-23 LAB — POCT CBC
Granulocyte percent: 68.4 %G (ref 37–80)
HCT, POC: 51.6 % — AB (ref 37.7–47.9)
Hemoglobin: 16.3 g/dL — AB (ref 12.2–16.2)
Lymph, poc: 2.6 (ref 0.6–3.4)
MCH, POC: 28.8 pg (ref 27–31.2)
MCHC: 31.7 g/dL — AB (ref 31.8–35.4)
MCV: 90.8 fL (ref 80–97)
MPV: 8 fL (ref 0–99.8)
POC Granulocyte: 7.1 — AB (ref 2–6.9)
POC LYMPH PERCENT: 25.2 %L (ref 10–50)
Platelet Count, POC: 328 10*3/uL (ref 142–424)
RBC: 5.68 M/uL — AB (ref 4.04–5.48)
RDW, POC: 14.7 %
WBC: 10.4 10*3/uL — AB (ref 4.6–10.2)

## 2014-09-23 MED ORDER — OLMESARTAN MEDOXOMIL-HCTZ 40-12.5 MG PO TABS: 1.0000 | ORAL_TABLET | Freq: Every day | ORAL | Status: DC

## 2014-09-23 MED ORDER — LEVOTHYROXINE SODIUM 88 MCG PO TABS: 88.0000 ug | ORAL_TABLET | Freq: Every day | ORAL | Status: DC

## 2014-09-23 MED ORDER — DULOXETINE HCL 30 MG PO CPEP: 30.0000 mg | ORAL_CAPSULE | Freq: Every day | ORAL | Status: DC

## 2014-09-23 MED ORDER — CELECOXIB 200 MG PO CAPS: 200.0000 mg | ORAL_CAPSULE | Freq: Every day | ORAL | Status: DC

## 2014-09-23 MED ORDER — AMLODIPINE BESYLATE 5 MG PO TABS: 5.0000 mg | ORAL_TABLET | Freq: Every day | ORAL | Status: DC

## 2014-09-23 MED ORDER — CELECOXIB 200 MG PO CAPS: 200.0000 mg | ORAL_CAPSULE | Freq: Two times a day (BID) | ORAL | Status: DC

## 2014-09-23 NOTE — Progress Notes (Signed)
Subjective:  Patient ID: Erin Ewing, female    DOB: June 04, 1936  Age: 79 y.o. MRN: [id number]  CC: Hypertension; Hypothyroidism; and Arthritis   HPI Erin Ewing presents for pain in  Knees. They are shot. Declines surgical options. She would like to try medications.  Patient presents for follow-up on  thyroid. She has a history of hypothyroidism for many years. It has been stable recently. Pt. denies any change in  voice, loss of hair, heat or cold intolerance. Energy level has been adequate to good. She denies constipation and diarrhea. No myxedema. Medication is as noted below. Verified that pt is taking it daily on an empty stomach. Well tolerated.   follow-up of hypertension. Patient has no history of headache chest pain or shortness of breath or recent cough. Patient also denies symptoms of TIA such as numbness weakness lateralizing. Patient checks  blood pressure at home and has not had any elevated readings recently. Patient denies side effects from his medication. States taking it regularly.   Patient has had a great deal of loss recently. She lost her husband. This occurred about a year ago to 2 years ago [place] who had just lost her husband. Unfortunately they didn't get along well even though the been lifelong friends before and that relationship is intended with the patient moving back here to [place] from the beach. She states that her friend had become a control freak. Additionally she lost a pet dog of 15 years earlier this week. She is overwhelmed and not enjoying life. She is become sad tearful and withdrawn. She is having trouble concentrating and making decisions.  History Erin Ewing has a past medical history of Reflex sympathetic dystrophy (08/16/2012); Subclinical hypothyroidism; Hypertension; Polycythemia; Arthritis; and Obesity.   She has no past surgical history on file.   Her family history includes Heart disease in her  father.She reports that she has been smoking Cigarettes.  She has been smoking about 0.25 packs per day. She does not have any smokeless tobacco history on file. She reports that she does not drink alcohol or use illicit drugs.  Current Outpatient Prescriptions on File Prior to Visit  Medication Sig Dispense Refill  . aspirin 81 MG tablet Take 81 mg by mouth daily.    . clonazePAM (KLONOPIN) 0.5 MG tablet Take 1 tablet (0.5 mg total) by mouth at bedtime as needed for anxiety. (Patient not taking: Reported on 09/23/2014) 30 tablet 2   No current facility-administered medications on file prior to visit.    ROS Review of Systems  Constitutional: Negative for fever, chills, diaphoresis, appetite change, fatigue and unexpected weight change.  HENT: Negative for congestion, ear pain, hearing loss, postnasal drip, rhinorrhea, sneezing, sore throat and trouble swallowing.   Eyes: Negative for pain.  Respiratory: Negative for cough, chest tightness and shortness of breath.   Cardiovascular: Negative for chest pain and palpitations.  Gastrointestinal: Negative for nausea, vomiting, abdominal pain, diarrhea and constipation.  Genitourinary: Negative for dysuria, frequency and menstrual problem.  Musculoskeletal: Positive for arthralgias. Negative for joint swelling.  Skin: Negative for rash.  Neurological: Negative for dizziness, weakness, numbness and headaches.  Psychiatric/Behavioral: Positive for dysphoric mood. Negative for agitation.    Objective:  BP 114/70 mmHg  Pulse 62  Temp(Src) 97 F (36.1 C) (Oral)  Ht 5' 5.5" (1.664 m)  Wt 212 lb (96.163 kg)  BMI 34.73 kg/m2  BP Readings from Last 3 Encounters:  09/23/14 114/70  09/09/13 132/75  07/16/13 149/84    Wt Readings from Last 3 Encounters:  09/23/14 212 lb (96.163 kg)  09/09/13 214 lb 12.8 oz (97.433 kg)  07/16/13 217 lb 9.6 oz (98.703 kg)     Physical Exam  Constitutional: She is oriented to person, place, and time. She  appears well-developed and well-nourished. No distress.  HENT:  Head: Normocephalic and atraumatic.  Right Ear: External ear normal.  Left Ear: External ear normal.  Nose: Nose normal.  Mouth/Throat: Oropharynx is clear and moist.  Eyes: Conjunctivae and EOM are normal. Pupils are equal, round, and reactive to light.  Neck: Normal range of motion. Neck supple. No thyromegaly present.  Cardiovascular: Normal rate, regular rhythm and normal heart sounds.   No murmur heard. Pulmonary/Chest: Effort normal and breath sounds normal. No respiratory distress. She has no wheezes. She has no rales.  Abdominal: Soft. Bowel sounds are normal. She exhibits no distension. There is no tenderness.  Lymphadenopathy:    She has no cervical adenopathy.  Neurological: She is alert and oriented to person, place, and time. She has normal reflexes.  Skin: Skin is warm and dry.  Psychiatric: She has a normal mood and affect. Her behavior is normal. Judgment and thought content normal.    No results found for: HGBA1C  Lab Results  Component Value Date   WBC 10.4* 09/23/2014   HGB 16.3* 09/23/2014   HCT 51.6* 09/23/2014   GLUCOSE 91 09/09/2013   ALT 13 04/05/2013   AST 13 04/05/2013   NA 138 09/09/2013   K 4.3 09/09/2013   CL 97 09/09/2013   CREATININE 0.77 09/09/2013   BUN 14 09/09/2013   CO2 21 09/09/2013   TSH 15.400* 09/09/2013    US Aorta  07/26/2011   *RADIOLOGY REPORT*  Clinical Data:  Former smoking history, hypertension, screening for abdominal aortic aneurysm  ULTRASOUND OF ABDOMINAL AORTA  Technique:  Ultrasound examination of the abdominal aorta was performed to evaluate for abdominal aortic aneurysm.  Comparison: None.  Abdominal Aorta:  No abdominal aortic aneurysm is seen.        Maximum AP diameter:  2.7 cm.       Maximum TRV diameter:  2.6 cm.  IMPRESSION: No abdominal aortic aneurysm is noted.  Original Report Authenticated By: Joretta Bachelor, M.D.   Assessment & Plan:   Erin Ewing  was seen today for hypertension, hypothyroidism and arthritis.  Diagnoses and all orders for this visit:  Essential hypertension Orders: -     POCT CBC -     CMP14+EGFR -     NMR, lipoprofile -     amLODipine (NORVASC) 5 MG tablet; Take 1 tablet (5 mg total) by mouth daily. -     olmesartan-hydrochlorothiazide (BENICAR HCT) 40-12.5 MG per tablet; Take 1 tablet by mouth daily.  Vitamin D deficiency Orders: -     POCT CBC -     Vit D  25 hydroxy (rtn osteoporosis monitoring)  Hypothyroidism, unspecified hypothyroidism type Orders: -     Thyroid Panel With TSH  Arthritis Orders: -     POCT CBC  Other orders -     levothyroxine (SYNTHROID, LEVOTHROID) 88 MCG tablet; Take 1 tablet (88 mcg total) by mouth daily before breakfast. -     Discontinue: celecoxib (CELEBREX) 200 MG capsule; Take 1 capsule (200 mg total) by mouth 2 (two) times daily. -     Discontinue: DULoxetine (CYMBALTA) 30 MG capsule; Take 1 capsule (30 mg total) by mouth daily. -  celecoxib (CELEBREX) 200 MG capsule; Take 1 capsule (200 mg total) by mouth daily. -     DULoxetine (CYMBALTA) 30 MG capsule; Take 1 capsule (30 mg total) by mouth daily.   I have discontinued Ms. Suminski's traMADol and levothyroxine. I have also changed her levothyroxine and celecoxib. Additionally, I am having her maintain her aspirin, clonazePAM, amLODipine, olmesartan-hydrochlorothiazide, and DULoxetine.  Meds ordered this encounter  Medications  . DISCONTD: levothyroxine (SYNTHROID, LEVOTHROID) 88 MCG tablet    Sig: Take 88 mcg by mouth daily before breakfast.  . amLODipine (NORVASC) 5 MG tablet    Sig: Take 1 tablet (5 mg total) by mouth daily.    Dispense:  90 tablet    Refill:  3  . levothyroxine (SYNTHROID, LEVOTHROID) 88 MCG tablet    Sig: Take 1 tablet (88 mcg total) by mouth daily before breakfast.    Dispense:  90 tablet    Refill:  3  . olmesartan-hydrochlorothiazide (BENICAR HCT) 40-12.5 MG per tablet    Sig: Take 1  tablet by mouth daily.    Dispense:  90 tablet    Refill:  3  . DISCONTD: celecoxib (CELEBREX) 200 MG capsule    Sig: Take 1 capsule (200 mg total) by mouth 2 (two) times daily.    Dispense:  30 capsule    Refill:  5  . DISCONTD: DULoxetine (CYMBALTA) 30 MG capsule    Sig: Take 1 capsule (30 mg total) by mouth daily.    Dispense:  30 capsule    Refill:  0  . celecoxib (CELEBREX) 200 MG capsule    Sig: Take 1 capsule (200 mg total) by mouth daily.    Dispense:  30 capsule    Refill:  5  . DULoxetine (CYMBALTA) 30 MG capsule    Sig: Take 1 capsule (30 mg total) by mouth daily.    Dispense:  30 capsule    Refill:  0     Follow-up: Return in about 1 month (around 10/24/2014).  Claretta Fraise, M.D.

## 2014-09-23 NOTE — Patient Instructions (Signed)
When taking the new medicine for arthritis, celecoxib, you must switch from ibuprofen PM to Tylenol PM in order to help with her insomnia.

## 2014-09-24 LAB — NMR, LIPOPROFILE
Cholesterol: 175 mg/dL (ref 100–199)
HDL Cholesterol by NMR: 53 mg/dL (ref >39)
HDL Particle Number: 39 umol/L (ref >=30.5)
LDL Particle Number: 1280 nmol/L — ABNORMAL HIGH (ref <1000)
LDL Size: 20.6 nm (ref >20.5)
LDL-C: 88 mg/dL (ref 0–99)
LP-IR Score: 64 — ABNORMAL HIGH (ref <=45)
Small LDL Particle Number: 769 nmol/L — ABNORMAL HIGH (ref <=527)
Triglycerides by NMR: 168 mg/dL — ABNORMAL HIGH (ref 0–149)

## 2014-09-24 LAB — CMP14+EGFR
ALT: 14 IU/L (ref 0–32)
AST: 11 IU/L (ref 0–40)
Albumin/Globulin Ratio: 2.1 (ref 1.1–2.5)
Albumin: 4.6 g/dL (ref 3.5–4.8)
Alkaline Phosphatase: 82 IU/L (ref 39–117)
BUN/Creatinine Ratio: 17 (ref 11–26)
BUN: 12 mg/dL (ref 8–27)
Bilirubin Total: 0.5 mg/dL (ref 0.0–1.2)
CO2: 23 mmol/L (ref 18–29)
Calcium: 10.1 mg/dL (ref 8.7–10.3)
Chloride: 99 mmol/L (ref 97–108)
Creatinine, Ser: 0.69 mg/dL (ref 0.57–1.00)
GFR calc Af Amer: 98 mL/min/{1.73_m2} (ref >59)
GFR calc non Af Amer: 85 mL/min/{1.73_m2} (ref >59)
Globulin, Total: 2.2 g/dL (ref 1.5–4.5)
Glucose: 106 mg/dL — ABNORMAL HIGH (ref 65–99)
Potassium: 4.5 mmol/L (ref 3.5–5.2)
Sodium: 141 mmol/L (ref 134–144)
Total Protein: 6.8 g/dL (ref 6.0–8.5)

## 2014-09-24 LAB — VITAMIN D 25 HYDROXY (VIT D DEFICIENCY, FRACTURES): Vit D, 25-Hydroxy: 24.6 ng/mL — ABNORMAL LOW (ref 30.0–100.0)

## 2014-09-24 LAB — THYROID PANEL WITH TSH
Free Thyroxine Index: 2.9 (ref 1.2–4.9)
T3 Uptake Ratio: 28 % (ref 24–39)
T4, Total: 10.5 ug/dL (ref 4.5–12.0)
TSH: 5.12 u[IU]/mL — ABNORMAL HIGH (ref 0.450–4.500)

## 2014-09-25 ENCOUNTER — Other Ambulatory Visit: Payer: Self-pay | Admitting: Family Medicine

## 2014-09-25 ENCOUNTER — Other Ambulatory Visit: Payer: Self-pay | Admitting: *Deleted

## 2014-09-25 MED ORDER — VITAMIN D (ERGOCALCIFEROL) 1.25 MG (50000 UNIT) PO CAPS: 50000.0000 [IU] | ORAL_CAPSULE | ORAL | Status: DC

## 2014-09-25 MED ORDER — LEVOTHYROXINE SODIUM 100 MCG PO TABS: 100.0000 ug | ORAL_TABLET | Freq: Every day | ORAL | Status: DC

## 2014-09-25 NOTE — Addendum Note (Signed)
Addended by: Fawn KirkHOLT, CATHY on: 09/25/2014 12:27 PM   Modules accepted: Kipp BroodSmartSet

## 2014-10-09 ENCOUNTER — Encounter: Payer: Self-pay | Admitting: *Deleted

## 2014-10-13 ENCOUNTER — Encounter: Payer: Self-pay | Admitting: *Deleted

## 2014-10-13 ENCOUNTER — Other Ambulatory Visit: Payer: Medicare Other

## 2014-10-16 ENCOUNTER — Other Ambulatory Visit: Payer: Medicare Other

## 2014-10-16 ENCOUNTER — Other Ambulatory Visit: Payer: Self-pay | Admitting: Family Medicine

## 2014-10-16 NOTE — Progress Notes (Signed)
Didn't need labs per [facility] IncKay

## 2014-10-27 ENCOUNTER — Ambulatory Visit: Payer: Medicare Other | Admitting: Family Medicine

## 2014-10-28 ENCOUNTER — Ambulatory Visit: Payer: Medicare Other | Admitting: Family Medicine

## 2014-10-30 ENCOUNTER — Ambulatory Visit: Payer: Medicare Other | Admitting: Family Medicine

## 2014-11-03 ENCOUNTER — Ambulatory Visit: Payer: Medicare Other | Admitting: Family Medicine

## 2014-11-11 ENCOUNTER — Encounter: Payer: Self-pay | Admitting: Family Medicine

## 2014-11-11 ENCOUNTER — Ambulatory Visit (INDEPENDENT_AMBULATORY_CARE_PROVIDER_SITE_OTHER): Payer: Medicare Other | Admitting: Family Medicine

## 2014-11-11 ENCOUNTER — Ambulatory Visit (INDEPENDENT_AMBULATORY_CARE_PROVIDER_SITE_OTHER): Payer: Medicare Other

## 2014-11-11 VITALS — Ht 65.5 in | Wt 217.6 lb

## 2014-11-11 DIAGNOSIS — E669 Obesity, unspecified: Secondary | ICD-10-CM

## 2014-11-11 DIAGNOSIS — D751 Secondary polycythemia: Secondary | ICD-10-CM

## 2014-11-11 DIAGNOSIS — Z780 Asymptomatic menopausal state: Secondary | ICD-10-CM

## 2014-11-11 DIAGNOSIS — E039 Hypothyroidism, unspecified: Secondary | ICD-10-CM

## 2014-11-11 DIAGNOSIS — F329 Major depressive disorder, single episode, unspecified: Secondary | ICD-10-CM | POA: Diagnosis not present

## 2014-11-11 DIAGNOSIS — F32A Depression, unspecified: Secondary | ICD-10-CM

## 2014-11-11 DIAGNOSIS — G4700 Insomnia, unspecified: Secondary | ICD-10-CM | POA: Diagnosis not present

## 2014-11-11 DIAGNOSIS — I10 Essential (primary) hypertension: Secondary | ICD-10-CM

## 2014-11-11 DIAGNOSIS — M129 Arthropathy, unspecified: Secondary | ICD-10-CM

## 2014-11-11 DIAGNOSIS — M170 Bilateral primary osteoarthritis of knee: Secondary | ICD-10-CM

## 2014-11-11 MED ORDER — CLONAZEPAM 0.5 MG PO TABS: 0.5000 mg | ORAL_TABLET | Freq: Every evening | ORAL | Status: DC | PRN

## 2014-11-11 MED ORDER — OLMESARTAN MEDOXOMIL-HCTZ 40-12.5 MG PO TABS: 1.0000 | ORAL_TABLET | Freq: Every day | ORAL | Status: DC

## 2014-11-11 MED ORDER — AMLODIPINE BESYLATE 5 MG PO TABS: 5.0000 mg | ORAL_TABLET | Freq: Every day | ORAL | Status: DC

## 2014-11-11 MED ORDER — CELECOXIB 200 MG PO CAPS: 200.0000 mg | ORAL_CAPSULE | Freq: Every day | ORAL | Status: DC

## 2014-11-11 MED ORDER — VITAMIN D (ERGOCALCIFEROL) 1.25 MG (50000 UNIT) PO CAPS: 50000.0000 [IU] | ORAL_CAPSULE | ORAL | Status: DC

## 2014-11-11 MED ORDER — DULOXETINE HCL 60 MG PO CPEP: 60.0000 mg | ORAL_CAPSULE | Freq: Every day | ORAL | Status: DC

## 2014-11-11 MED ORDER — LEVOTHYROXINE SODIUM 100 MCG PO TABS: 100.0000 ug | ORAL_TABLET | Freq: Every day | ORAL | Status: DC

## 2014-11-11 MED ORDER — DULOXETINE HCL 60 MG PO CPEP: 30.0000 mg | ORAL_CAPSULE | Freq: Every day | ORAL | Status: DC

## 2014-11-11 NOTE — Patient Instructions (Signed)
DASH Eating Plan °DASH stands for "Dietary Approaches to Stop Hypertension." The DASH eating plan is a healthy eating plan that has been shown to reduce high blood pressure (hypertension). Additional health benefits may include reducing the risk of type 2 diabetes mellitus, heart disease, and stroke. The DASH eating plan may also help with weight loss. °WHAT DO I NEED TO KNOW ABOUT THE DASH EATING PLAN? °For the DASH eating plan, you will follow these general guidelines: °· Choose foods with a percent daily value for sodium of less than 5% (as listed on the food label). °· Use salt-free seasonings or herbs instead of table salt or sea salt. °· Check with your health care provider or pharmacist before using salt substitutes. °· Eat lower-sodium products, often labeled as "lower sodium" or "no salt added." °· Eat fresh foods. °· Eat more vegetables, fruits, and low-fat dairy products. °· Choose whole grains. Look for the word "whole" as the first word in the ingredient list. °· Choose fish and skinless chicken or turkey more often than red meat. Limit fish, poultry, and meat to 6 oz (170 g) each day. °· Limit sweets, desserts, sugars, and sugary drinks. °· Choose heart-healthy fats. °· Limit cheese to 1 oz (28 g) per day. °· Eat more home-cooked food and less restaurant, buffet, and fast food. °· Limit fried foods. °· Cook foods using methods other than frying. °· Limit canned vegetables. If you do use them, rinse them well to decrease the sodium. °· When eating at a restaurant, ask that your food be prepared with less salt, or no salt if possible. °WHAT FOODS CAN I EAT? °Seek help from a dietitian for individual calorie needs. °Grains °Whole grain or whole wheat bread. Brown rice. Whole grain or whole wheat pasta. Quinoa, bulgur, and whole grain cereals. Low-sodium cereals. Corn or whole wheat flour tortillas. Whole grain cornbread. Whole grain crackers. Low-sodium crackers. °Vegetables °Fresh or frozen vegetables  (raw, steamed, roasted, or grilled). Low-sodium or reduced-sodium tomato and vegetable juices. Low-sodium or reduced-sodium tomato sauce and paste. Low-sodium or reduced-sodium canned vegetables.  °Fruits °All fresh, canned (in natural juice), or frozen fruits. °Meat and Other Protein Products °Ground beef (85% or leaner), grass-fed beef, or beef trimmed of fat. Skinless chicken or turkey. Ground chicken or turkey. Pork trimmed of fat. All fish and seafood. Eggs. Dried beans, peas, or lentils. Unsalted nuts and seeds. Unsalted canned beans. °Dairy °Low-fat dairy products, such as skim or 1% milk, 2% or reduced-fat cheeses, low-fat ricotta or cottage cheese, or plain low-fat yogurt. Low-sodium or reduced-sodium cheeses. °Fats and Oils °Tub margarines without trans fats. Light or reduced-fat mayonnaise and salad dressings (reduced sodium). Avocado. Safflower, olive, or canola oils. Natural peanut or almond butter. °Other °Unsalted popcorn and pretzels. °The items listed above may not be a complete list of recommended foods or beverages. Contact your dietitian for more options. °WHAT FOODS ARE NOT RECOMMENDED? °Grains °White bread. White pasta. White rice. Refined cornbread. Bagels and croissants. Crackers that contain trans fat. °Vegetables °Creamed or fried vegetables. Vegetables in a cheese sauce. Regular canned vegetables. Regular canned tomato sauce and paste. Regular tomato and vegetable juices. °Fruits °Dried fruits. Canned fruit in light or heavy syrup. Fruit juice. °Meat and Other Protein Products °Fatty cuts of meat. Ribs, chicken wings, bacon, sausage, bologna, salami, chitterlings, fatback, hot dogs, bratwurst, and packaged luncheon meats. Salted nuts and seeds. Canned beans with salt. °Dairy °Whole or 2% milk, cream, half-and-half, and cream cheese. Whole-fat or sweetened yogurt. Full-fat   cheeses or blue cheese. Nondairy creamers and whipped toppings. Processed cheese, cheese spreads, or cheese  curds. °Condiments °Onion and garlic salt, seasoned salt, table salt, and sea salt. Canned and packaged gravies. Worcestershire sauce. Tartar sauce. Barbecue sauce. Teriyaki sauce. Soy sauce, including reduced sodium. Steak sauce. Fish sauce. Oyster sauce. Cocktail sauce. Horseradish. Ketchup and mustard. Meat flavorings and tenderizers. Bouillon cubes. Hot sauce. Tabasco sauce. Marinades. Taco seasonings. Relishes. °Fats and Oils °Butter, stick margarine, lard, shortening, ghee, and bacon fat. Coconut, palm kernel, or palm oils. Regular salad dressings. °Other °Pickles and olives. Salted popcorn and pretzels. °The items listed above may not be a complete list of foods and beverages to avoid. Contact your dietitian for more information. °WHERE CAN I FIND MORE INFORMATION? °National [employer], [employer], [employer] Institute: www.nhlbi.nih.gov/health/health-topics/topics/dash/ °Document Released: 06/16/2011 Document Revised: 11/11/2013 Document Reviewed: 05/01/2013 °ExitCare® Patient Information ©2015 [employer]. This information is not intended to replace advice given to you by your health care provider. Make sure you discuss any questions you have with your health care provider. ° °

## 2014-11-11 NOTE — Progress Notes (Signed)
Subjective:  Patient ID: Erin Ewing, female    DOB: 25-Oct-1935  Age: 79 y.o. MRN: [id number]  CC: Arthritis and Depression   HPI Erin Ewing presents for follow up of depression. Multiple life events. Long-term pet, dog, died just after she was here last time. Sister became critically ill in [place]. Went to [facility].It was just Okay. She is a home body. States she gained some weight traveling frustrated by that.  Patient presents for follow-up on  thyroid. She has a history of hypothyroidism for many years. It has been stable recently. Pt. denies any change in  voice, loss of hair, heat or cold intolerance. Energy level has been adequate to good. She denies constipation and diarrhea. No myxedema. Medication is as noted below. Verified that pt is taking it daily on an empty stomach. Well tolerated.  She still has some joint pain but it is much better. The Celebrex makes it worse she can participate in her usual activities without excessive discomfort. She denies any side effects from the GI tract.  History Erin Ewing has a past medical history of Reflex sympathetic dystrophy (08/16/2012); Subclinical hypothyroidism; Hypertension; Polycythemia; Arthritis; and Obesity.   She has no past surgical history on file.   Her family history includes Heart disease in her father.She reports that she has been smoking Cigarettes.  She has been smoking about 0.25 packs per day. She does not have any smokeless tobacco history on file. She reports that she does not drink alcohol or use illicit drugs.  Current Outpatient Prescriptions on File Prior to Visit  Medication Sig Dispense Refill  . aspirin 81 MG tablet Take 81 mg by mouth daily.     No current facility-administered medications on file prior to visit.    ROS Review of Systems  Constitutional: Negative for fever, chills, diaphoresis, appetite change, fatigue and unexpected weight change.  HENT: Negative for congestion, ear  pain, sneezing, sore throat and trouble swallowing.   Eyes: Negative for pain.  Respiratory: Negative for cough, chest tightness and shortness of breath.   Cardiovascular: Negative for chest pain and palpitations.  Gastrointestinal: Negative for nausea, vomiting, abdominal pain, diarrhea and constipation.  Genitourinary: Negative for dysuria, frequency and menstrual problem.  Musculoskeletal: Negative for joint swelling and arthralgias.  Skin: Negative for rash.  Neurological: Negative for dizziness and weakness.  Psychiatric/Behavioral: Positive for sleep disturbance and dysphoric mood. Negative for behavioral problems, confusion, decreased concentration and agitation.    Objective:  Ht 5' 5.5" (1.664 m)  Wt 217 lb 9.6 oz (98.703 kg)  BMI 35.65 kg/m2  BP Readings from Last 3 Encounters:  09/23/14 114/70  09/09/13 132/75  07/16/13 149/84    Wt Readings from Last 3 Encounters:  11/11/14 217 lb 9.6 oz (98.703 kg)  09/23/14 212 lb (96.163 kg)  09/09/13 214 lb 12.8 oz (97.433 kg)     Physical Exam  Constitutional: She is oriented to person, place, and time. She appears well-developed and well-nourished. No distress.  HENT:  Head: Normocephalic and atraumatic.  Right Ear: External ear normal.  Left Ear: External ear normal.  Nose: Nose normal.  Mouth/Throat: Oropharynx is clear and moist.  Eyes: Conjunctivae and EOM are normal. Pupils are equal, round, and reactive to light.  Neck: Normal range of motion. Neck supple. No thyromegaly present.  Cardiovascular: Normal rate, regular rhythm and normal heart sounds.   No murmur heard. Pulmonary/Chest: Effort normal and breath sounds normal. No respiratory distress. She has no wheezes. She has no  rales.  Abdominal: Soft. Bowel sounds are normal. She exhibits no distension. There is no tenderness.  Lymphadenopathy:    She has no cervical adenopathy.  Neurological: She is alert and oriented to person, place, and time. She has normal  reflexes.  Skin: Skin is warm and dry.  Psychiatric: She has a normal mood and affect. Her behavior is normal. Judgment and thought content normal.    No results found for: HGBA1C  Lab Results  Component Value Date   WBC 10.4* 09/23/2014   HGB 16.3* 09/23/2014   HCT 51.6* 09/23/2014   GLUCOSE 106* 09/23/2014   CHOL 175 09/23/2014   TRIG 168* 09/23/2014   HDL 53 09/23/2014   ALT 14 09/23/2014   AST 11 09/23/2014   NA 141 09/23/2014   K 4.5 09/23/2014   CL 99 09/23/2014   CREATININE 0.69 09/23/2014   BUN 12 09/23/2014   CO2 23 09/23/2014   TSH 5.120* 09/23/2014    [place] Aorta  07/26/2011   *RADIOLOGY REPORT*  Clinical Data:  Former smoking history, hypertension, screening for abdominal aortic aneurysm  ULTRASOUND OF ABDOMINAL AORTA  Technique:  Ultrasound examination of the abdominal aorta was performed to evaluate for abdominal aortic aneurysm.  Comparison: None.  Abdominal Aorta:  No abdominal aortic aneurysm is seen.        Maximum AP diameter:  2.7 cm.       Maximum TRV diameter:  2.6 cm.  IMPRESSION: No abdominal aortic aneurysm is noted.  Original Report Authenticated By: Joretta Bachelor, M.D.   Assessment & Plan:   Erin Ewing was seen today for arthritis and depression.  Diagnoses and all orders for this visit:  Hypothyroidism, unspecified hypothyroidism type Orders: -     CMP14+EGFR; Future -     TSH; Future -     T4, free; Future  Postmenopausal Orders: -     DG Bone Density -     CMP14+EGFR; Future  Essential hypertension Orders: -     amLODipine (NORVASC) 5 MG tablet; Take 1 tablet (5 mg total) by mouth daily. -     olmesartan-hydrochlorothiazide (BENICAR HCT) 40-12.5 MG per tablet; Take 1 tablet by mouth daily. -     CMP14+EGFR; Future  Insomnia Orders: -     clonazePAM (KLONOPIN) 0.5 MG tablet; Take 1 tablet (0.5 mg total) by mouth at bedtime as needed for anxiety. -     CMP14+EGFR; Future  Depression Orders: -     CMP14+EGFR; Future  Polycythemia,  secondary Orders: -     POCT CBC; Future -     CMP14+EGFR; Future  Arthritis of both knees Orders: -     CMP14+EGFR; Future  Obesity Orders: -     CMP14+EGFR; Future  Other orders -     celecoxib (CELEBREX) 200 MG capsule; Take 1 capsule (200 mg total) by mouth daily. -     Discontinue: DULoxetine (CYMBALTA) 60 MG capsule; Take 1 capsule (60 mg total) by mouth daily. -     levothyroxine (SYNTHROID, LEVOTHROID) 100 MCG tablet; Take 1 tablet (100 mcg total) by mouth daily before breakfast. -     Vitamin D, Ergocalciferol, (DRISDOL) 50000 UNITS CAPS capsule; Take 1 capsule (50,000 Units total) by mouth 2 (two) times a week. -     DULoxetine (CYMBALTA) 60 MG capsule; Take 1 capsule (60 mg total) by mouth daily.   I am having Ms. Shadduck maintain her aspirin, amLODipine, celecoxib, clonazePAM, levothyroxine, olmesartan-hydrochlorothiazide, Vitamin D (Ergocalciferol), and DULoxetine.  Meds ordered  this encounter  Medications  . amLODipine (NORVASC) 5 MG tablet    Sig: Take 1 tablet (5 mg total) by mouth daily.    Dispense:  90 tablet    Refill:  3  . celecoxib (CELEBREX) 200 MG capsule    Sig: Take 1 capsule (200 mg total) by mouth daily.    Dispense:  90 capsule    Refill:  4  . clonazePAM (KLONOPIN) 0.5 MG tablet    Sig: Take 1 tablet (0.5 mg total) by mouth at bedtime as needed for anxiety.    Dispense:  90 tablet    Refill:  1  . DISCONTD: DULoxetine (CYMBALTA) 60 MG capsule    Sig: Take 1 capsule (60 mg total) by mouth daily.    Dispense:  90 capsule    Refill:  3  . levothyroxine (SYNTHROID, LEVOTHROID) 100 MCG tablet    Sig: Take 1 tablet (100 mcg total) by mouth daily before breakfast.    Dispense:  90 tablet    Refill:  4  . olmesartan-hydrochlorothiazide (BENICAR HCT) 40-12.5 MG per tablet    Sig: Take 1 tablet by mouth daily.    Dispense:  90 tablet    Refill:  4  . Vitamin D, Ergocalciferol, (DRISDOL) 50000 UNITS CAPS capsule    Sig: Take 1 capsule (50,000 Units  total) by mouth 2 (two) times a week.    Dispense:  16 capsule    Refill:  0  . DULoxetine (CYMBALTA) 60 MG capsule    Sig: Take 1 capsule (60 mg total) by mouth daily.    Dispense:  90 capsule    Refill:  3   DASH diet recommended. Cymbalta increased to 60 mg daily from 30.  Follow-up: Return in about 6 months (around 05/14/2015).  Claretta Fraise, M.D.

## 2014-11-19 ENCOUNTER — Telehealth: Payer: Self-pay | Admitting: Family Medicine

## 2014-11-19 NOTE — Telephone Encounter (Signed)
Patient has a sever allergy to nuts and fish and the pharmacy is questioning if she should take vitamin d since it contains nut and fish. Pharmacy is requesting a call back at [phone].

## 2014-11-19 NOTE — Telephone Encounter (Signed)
[facility] and request a brand without nuts or fish.

## 2014-11-20 NOTE — Telephone Encounter (Signed)
Patient aware and pharmacy called and vitamin d rx cancelled.

## 2015-01-02 ENCOUNTER — Telehealth: Payer: Self-pay | Admitting: Family Medicine

## 2015-01-02 MED ORDER — DICLOFENAC SODIUM 75 MG PO TBEC: 75.0000 mg | DELAYED_RELEASE_TABLET | Freq: Two times a day (BID) | ORAL | Status: DC

## 2015-01-02 NOTE — Telephone Encounter (Signed)
Patient was having problems with diarrhea. She stopped taking Celebrex and the diarrhea resolved. Celebrex really helped with her joint pain. Do you have any other suggestions?

## 2015-01-05 ENCOUNTER — Telehealth: Payer: Self-pay | Admitting: Family Medicine

## 2015-01-05 MED ORDER — NABUMETONE 500 MG PO TABS: 500.0000 mg | ORAL_TABLET | Freq: Two times a day (BID) | ORAL | Status: DC

## 2015-01-05 NOTE — Telephone Encounter (Signed)
Pt has tried taking Diclofenac It is giving her diarrhea Can pt get something else for pain

## 2015-01-05 NOTE — Telephone Encounter (Signed)
Patient aware.  She will notify us if she has any problems.

## 2015-01-05 NOTE — Telephone Encounter (Signed)
Nabumetone is a medicine unique from the other 2 that hopefully will not cause the side effects she has been experiencing. Prescription sent to her pharmacy.

## 2015-01-21 ENCOUNTER — Ambulatory Visit (INDEPENDENT_AMBULATORY_CARE_PROVIDER_SITE_OTHER): Payer: Medicare Other | Admitting: Family Medicine

## 2015-01-21 ENCOUNTER — Encounter: Payer: Self-pay | Admitting: Family Medicine

## 2015-01-21 VITALS — BP 129/86 | HR 97 | Temp 98.2°F | Ht 65.0 in | Wt 221.6 lb

## 2015-01-21 DIAGNOSIS — M461 Sacroiliitis, not elsewhere classified: Secondary | ICD-10-CM | POA: Diagnosis not present

## 2015-01-21 MED ORDER — PREDNISONE 10 MG PO TABS: ORAL_TABLET | ORAL | Status: DC

## 2015-01-21 MED ORDER — TRAMADOL HCL 50 MG PO TABS: 50.0000 mg | ORAL_TABLET | Freq: Four times a day (QID) | ORAL | Status: DC | PRN

## 2015-01-21 NOTE — Progress Notes (Signed)
Subjective:  Patient ID: Erin Ewing, female    DOB: March 15, 1936  Age: 79 y.o. MRN: [id number]  CC: Back Pain   HPI Erin Ewing presents for back pain at midline, lower lumbar. Radiating to both posterior thighs. Some relief with rest and heat. Rated at 6/10. Causes decrease in activities to avoid pain.No known injury. No hx of chronic LBP. Suddden onset 3 days ago while performing light chores.  History Erin Ewing has a past medical history of Reflex sympathetic dystrophy (08/16/2012); Subclinical hypothyroidism; Hypertension; Polycythemia; Arthritis; and Obesity.   She has no past surgical history on file.   Her family history includes Heart disease in her father.She reports that she has been smoking Cigarettes.  She has been smoking about 0.25 packs per day. She does not have any smokeless tobacco history on file. She reports that she does not drink alcohol or use illicit drugs.  Outpatient Prescriptions Prior to Visit  Medication Sig Dispense Refill  . amLODipine (NORVASC) 5 MG tablet Take 1 tablet (5 mg total) by mouth daily. 90 tablet 3  . aspirin 81 MG tablet Take 81 mg by mouth daily.    . DULoxetine (CYMBALTA) 60 MG capsule Take 1 capsule (60 mg total) by mouth daily. 90 capsule 3  . levothyroxine (SYNTHROID, LEVOTHROID) 100 MCG tablet Take 1 tablet (100 mcg total) by mouth daily before breakfast. 90 tablet 4  . nabumetone (RELAFEN) 500 MG tablet Take 1 tablet (500 mg total) by mouth 2 (two) times daily. For muscle and joint pain 120 tablet 2  . olmesartan-hydrochlorothiazide (BENICAR HCT) 40-12.5 MG per tablet Take 1 tablet by mouth daily. 90 tablet 4  . clonazePAM (KLONOPIN) 0.5 MG tablet Take 1 tablet (0.5 mg total) by mouth at bedtime as needed for anxiety. 90 tablet 1  . Vitamin D, Ergocalciferol, (DRISDOL) 50000 UNITS CAPS capsule Take 1 capsule (50,000 Units total) by mouth 2 (two) times a week. (Patient not taking: Reported on 01/21/2015) 16 capsule 0   No  facility-administered medications prior to visit.    ROS Review of Systems  Constitutional: Negative for fever, chills, diaphoresis, appetite change and fatigue.  HENT: Negative for congestion, ear pain, hearing loss, postnasal drip, rhinorrhea, sore throat and trouble swallowing.   Respiratory: Negative for cough, chest tightness and shortness of breath.   Cardiovascular: Negative for chest pain and palpitations.  Gastrointestinal: Negative for abdominal pain.  Musculoskeletal: Positive for myalgias, back pain and arthralgias.  Skin: Negative for rash.    Objective:  BP 129/86 mmHg  Pulse 97  Temp(Src) 98.2 F (36.8 C) (Oral)  Ht  (1.651 m)  Wt 221 lb 9.6 oz (100.517 kg)  BMI 36.88 kg/m2  BP Readings from Last 3 Encounters:  01/28/15 134/71  01/21/15 129/86  09/23/14 114/70    Wt Readings from Last 3 Encounters:  01/28/15 222 lb 4.8 oz (100.835 kg)  01/21/15 221 lb 9.6 oz (100.517 kg)  11/11/14 217 lb 9.6 oz (98.703 kg)     Physical Exam  Constitutional: She is oriented to person, place, and time. She appears well-developed and well-nourished. No distress.  HENT:  Head: Normocephalic and atraumatic.  Eyes: Conjunctivae are normal. Pupils are equal, round, and reactive to light.  Neck: Normal range of motion. Neck supple. No thyromegaly present.  Cardiovascular: Normal rate, regular rhythm and normal heart sounds.   No murmur heard. Pulmonary/Chest: Effort normal and breath sounds normal. No respiratory distress. She has no wheezes. She has no rales.  Abdominal:  Soft. Bowel sounds are normal. She exhibits no distension. There is no tenderness.  Musculoskeletal: She exhibits tenderness (tender bilaterally at SI joints. Right Patrick sign markedly positive. Straight leg raise minimally uncomfortable on the right.). She exhibits no edema.  Lymphadenopathy:    She has no cervical adenopathy.  Neurological: She is alert and oriented to person, place, and time.  Skin:  Skin is warm and dry.  Psychiatric: She has a normal mood and affect. Her behavior is normal. Judgment and thought content normal.    No results found for: HGBA1C  Lab Results  Component Value Date   WBC 10.4* 09/23/2014   HGB 16.3* 09/23/2014   HCT 51.6* 09/23/2014   GLUCOSE 106* 09/23/2014   CHOL 175 09/23/2014   TRIG 168* 09/23/2014   HDL 53 09/23/2014   ALT 14 09/23/2014   AST 11 09/23/2014   NA 141 09/23/2014   K 4.5 09/23/2014   CL 99 09/23/2014   CREATININE 0.69 09/23/2014   BUN 12 09/23/2014   CO2 23 09/23/2014   TSH 5.120* 09/23/2014    [place] Aorta  07/26/2011   *RADIOLOGY REPORT*  Clinical Data:  Former smoking history, hypertension, screening for abdominal aortic aneurysm  ULTRASOUND OF ABDOMINAL AORTA  Technique:  Ultrasound examination of the abdominal aorta was performed to evaluate for abdominal aortic aneurysm.  Comparison: None.  Abdominal Aorta:  No abdominal aortic aneurysm is seen.        Maximum AP diameter:  2.7 cm.       Maximum TRV diameter:  2.6 cm.  IMPRESSION: No abdominal aortic aneurysm is noted.  Original Report Authenticated By: Juline PatchPAUL D. BARRY, M.D.   Assessment & Plan:   Erin Hashimotoatricia was seen today for back pain.  Diagnoses and all orders for this visit:  Sacroiliac inflammation  Other orders -     predniSONE (DELTASONE) 10 MG tablet; Take 5 daily for 3 days followed by 4,3,2 and 1 for 3 days each. -     traMADol (ULTRAM) 50 MG tablet; Take 1 tablet (50 mg total) by mouth 4 (four) times daily as needed for moderate pain.  I have discontinued Erin Ewing's Vitamin D (Ergocalciferol). I am also having her start on predniSONE and traMADol. Additionally, I am having her maintain her aspirin, amLODipine, clonazePAM, levothyroxine, olmesartan-hydrochlorothiazide, DULoxetine, and nabumetone.  Meds ordered this encounter  Medications  . predniSONE (DELTASONE) 10 MG tablet    Sig: Take 5 daily for 3 days followed by 4,3,2 and 1 for 3 days each.     Dispense:  45 tablet    Refill:  0  . traMADol (ULTRAM) 50 MG tablet    Sig: Take 1 tablet (50 mg total) by mouth 4 (four) times daily as needed for moderate pain.    Dispense:  60 tablet    Refill:  02     Follow-up: Return in about 2 weeks (around 02/04/2015), or if symptoms worsen or fail to improve.  Mechele ClaudeWarren Margues Filippini, M.D.

## 2015-01-21 NOTE — Patient Instructions (Signed)
Hold off on nabumetone until you finish the prednisone

## 2015-01-26 ENCOUNTER — Telehealth: Payer: Self-pay | Admitting: Family Medicine

## 2015-01-26 NOTE — Telephone Encounter (Signed)
Patient states that she had tried 2 and it does not help either

## 2015-01-26 NOTE — Telephone Encounter (Signed)
The tramadol is not helping her at all.  please advise

## 2015-01-26 NOTE — Telephone Encounter (Signed)
Recheck here

## 2015-01-26 NOTE — Telephone Encounter (Signed)
Have her take two at a time

## 2015-01-26 NOTE — Telephone Encounter (Signed)
Pt called and f/u appt made for wednesday

## 2015-01-28 ENCOUNTER — Encounter: Payer: Self-pay | Admitting: Family Medicine

## 2015-01-28 ENCOUNTER — Ambulatory Visit (INDEPENDENT_AMBULATORY_CARE_PROVIDER_SITE_OTHER): Payer: Medicare Other | Admitting: Family Medicine

## 2015-01-28 VITALS — BP 134/71 | HR 72 | Temp 97.6°F | Ht 65.0 in | Wt 222.3 lb

## 2015-01-28 DIAGNOSIS — M461 Sacroiliitis, not elsewhere classified: Secondary | ICD-10-CM

## 2015-01-28 MED ORDER — HYDROCODONE-ACETAMINOPHEN 5-325 MG PO TABS: 1.0000 | ORAL_TABLET | Freq: Four times a day (QID) | ORAL | Status: DC | PRN

## 2015-01-28 NOTE — Progress Notes (Signed)
Subjective:  Patient ID: Erin Ewing, female    DOB: 12/31/1935  Age: 79 y.o. MRN: [id number]  CC: Follow-up   HPI Erin Soxatricia S Ewing presents for No relief with tramadol. Doubled up on 7/17. Still no relief. She started on July 18 with the prednisone dose pack previously prescribed. Since that time she had noted fairly good relief. Today she is in for follow-up noting that pain is now 2/10 with the decrease from 8/10 4 days ago. She would like to have something for pain for possible flares later. She is concerned about pain returning after finishing the Dosepak. Meanwhile she is tolerating the dose pack and says that she slept better last night than she has in a long time. She has had no nausea or emotional lability reported.  History Erin Ewing has a past medical history of Reflex sympathetic dystrophy (08/16/2012); Subclinical hypothyroidism; Hypertension; Polycythemia; Arthritis; and Obesity.   She has no past surgical history on file.   Her family history includes Heart disease in her father.She reports that she has been smoking Cigarettes.  She has been smoking about 0.25 packs per day. She does not have any smokeless tobacco history on file. She reports that she does not drink alcohol or use illicit drugs.  Outpatient Prescriptions Prior to Visit  Medication Sig Dispense Refill  . amLODipine (NORVASC) 5 MG tablet Take 1 tablet (5 mg total) by mouth daily. 90 tablet 3  . aspirin 81 MG tablet Take 81 mg by mouth daily.    . clonazePAM (KLONOPIN) 0.5 MG tablet Take 1 tablet (0.5 mg total) by mouth at bedtime as needed for anxiety. 90 tablet 1  . DULoxetine (CYMBALTA) 60 MG capsule Take 1 capsule (60 mg total) by mouth daily. 90 capsule 3  . levothyroxine (SYNTHROID, LEVOTHROID) 100 MCG tablet Take 1 tablet (100 mcg total) by mouth daily before breakfast. 90 tablet 4  . nabumetone (RELAFEN) 500 MG tablet Take 1 tablet (500 mg total) by mouth 2 (two) times daily. For muscle and joint pain  120 tablet 2  . olmesartan-hydrochlorothiazide (BENICAR HCT) 40-12.5 MG per tablet Take 1 tablet by mouth daily. 90 tablet 4  . predniSONE (DELTASONE) 10 MG tablet Take 5 daily for 3 days followed by 4,3,2 and 1 for 3 days each. 45 tablet 0  . traMADol (ULTRAM) 50 MG tablet Take 1 tablet (50 mg total) by mouth 4 (four) times daily as needed for moderate pain. 60 tablet 02   No facility-administered medications prior to visit.    ROS Review of Systems  Constitutional: Negative for fever, chills, diaphoresis, appetite change, fatigue and unexpected weight change.  HENT: Negative for congestion, ear pain, hearing loss, postnasal drip, rhinorrhea, sneezing, sore throat and trouble swallowing.   Eyes: Negative for pain.  Respiratory: Negative for cough, chest tightness and shortness of breath.   Cardiovascular: Negative for chest pain and palpitations.  Gastrointestinal: Negative for nausea, vomiting, abdominal pain, diarrhea and constipation.  Genitourinary: Negative for dysuria, frequency and menstrual problem.  Musculoskeletal: Negative for joint swelling and arthralgias.  Skin: Negative for rash.  Neurological: Negative for dizziness, weakness, numbness and headaches.  Psychiatric/Behavioral: Negative for dysphoric mood and agitation.    Objective:  BP 134/71 mmHg  Pulse 72  Temp(Src) 97.6 F (36.4 C) (Oral)  Ht 5\' 5"  (1.651 m)  Wt 222 lb 4.8 oz (100.835 kg)  BMI 36.99 kg/m2  BP Readings from Last 3 Encounters:  01/28/15 134/71  01/21/15 129/86  09/23/14 114/70  Wt Readings from Last 3 Encounters:  01/28/15 222 lb 4.8 oz (100.835 kg)  01/21/15 221 lb 9.6 oz (100.517 kg)  11/11/14 217 lb 9.6 oz (98.703 kg)     Physical Exam  Constitutional: She is oriented to person, place, and time. She appears well-developed and well-nourished. No distress.  HENT:  Head: Normocephalic and atraumatic.  Right Ear: External ear normal.  Left Ear: External ear normal.  Nose: Nose  normal.  Mouth/Throat: Oropharynx is clear and moist.  Eyes: Conjunctivae and EOM are normal. Pupils are equal, round, and reactive to light.  Neck: Normal range of motion. Neck supple. No thyromegaly present.  Cardiovascular: Normal rate, regular rhythm and normal heart sounds.   No murmur heard. Pulmonary/Chest: Effort normal and breath sounds normal. No respiratory distress. She has no wheezes. She has no rales.  Abdominal: Soft. Bowel sounds are normal. She exhibits no distension. There is no tenderness.  Lymphadenopathy:    She has no cervical adenopathy.  Neurological: She is alert and oriented to person, place, and time. She has normal reflexes.  Skin: Skin is warm and dry.  Psychiatric: She has a normal mood and affect. Her behavior is normal. Judgment and thought content normal.    No results found for: HGBA1C  Lab Results  Component Value Date   WBC 10.4* 09/23/2014   HGB 16.3* 09/23/2014   HCT 51.6* 09/23/2014   GLUCOSE 106* 09/23/2014   CHOL 175 09/23/2014   TRIG 168* 09/23/2014   HDL 53 09/23/2014   ALT 14 09/23/2014   AST 11 09/23/2014   NA 141 09/23/2014   K 4.5 09/23/2014   CL 99 09/23/2014   CREATININE 0.69 09/23/2014   BUN 12 09/23/2014   CO2 23 09/23/2014   TSH 5.120* 09/23/2014    US Aorta  07/26/2011   *RADIOLOGY REPORT*  Clinical Data:  Former smoking history, hypertension, screening for abdominal aortic aneurysm  ULTRASOUND OF ABDOMINAL AORTA  Technique:  Ultrasound examination of the abdominal aorta was performed to evaluate for abdominal aortic aneurysm.  Comparison: None.  Abdominal Aorta:  No abdominal aortic aneurysm is seen.        Maximum AP diameter:  2.7 cm.       Maximum TRV diameter:  2.6 cm.  IMPRESSION: No abdominal aortic aneurysm is noted.  Original Report Authenticated By: Juline Patch, M.D.   Assessment & Plan:   Erin Ewing was seen today for follow-up.  Diagnoses and all orders for this visit:  Sacroiliac inflammation  Other  orders -     HYDROcodone-acetaminophen (NORCO) 5-325 MG per tablet; Take 1 tablet by mouth every 6 (six) hours as needed for moderate pain.  I am having Erin Ewing start on HYDROcodone-acetaminophen. I am also having her maintain her aspirin, amLODipine, clonazePAM, levothyroxine, olmesartan-hydrochlorothiazide, DULoxetine, nabumetone, predniSONE, and traMADol.  Meds ordered this encounter  Medications  . HYDROcodone-acetaminophen (NORCO) 5-325 MG per tablet    Sig: Take 1 tablet by mouth every 6 (six) hours as needed for moderate pain.    Dispense:  30 tablet    Refill:  0     Follow-up: Return if symptoms worsen or fail to improve.  Mechele Claude, M.D.

## 2015-02-09 ENCOUNTER — Encounter: Payer: Self-pay | Admitting: *Deleted

## 2015-02-11 ENCOUNTER — Ambulatory Visit: Payer: Medicare Other | Admitting: Family Medicine

## 2015-03-10 ENCOUNTER — Encounter: Payer: Self-pay | Admitting: Family Medicine

## 2015-03-10 ENCOUNTER — Ambulatory Visit (INDEPENDENT_AMBULATORY_CARE_PROVIDER_SITE_OTHER): Payer: Medicare Other | Admitting: Family Medicine

## 2015-03-10 ENCOUNTER — Ambulatory Visit (INDEPENDENT_AMBULATORY_CARE_PROVIDER_SITE_OTHER): Payer: Medicare Other

## 2015-03-10 VITALS — BP 114/71 | HR 77 | Temp 98.7°F | Ht 65.0 in | Wt 227.4 lb

## 2015-03-10 DIAGNOSIS — M545 Low back pain, unspecified: Secondary | ICD-10-CM

## 2015-03-10 DIAGNOSIS — M79604 Pain in right leg: Secondary | ICD-10-CM

## 2015-03-10 MED ORDER — HYDROCODONE-ACETAMINOPHEN 5-325 MG PO TABS: 1.0000 | ORAL_TABLET | Freq: Four times a day (QID) | ORAL | Status: DC | PRN

## 2015-03-10 NOTE — Progress Notes (Signed)
Subjective:  Patient ID: Erin Ewing, female    DOB: 09/22/1935  Age: 79 y.o. MRN: [id number]  CC: Back Pain   HPI Erin Ewing presents for 7-8 LBP. Pred helped for 2 weeks. Taking hydrocodone BID. Drops pain to 1/10. Denies drowsiness and constipation.   History Erin Ewing has a past medical history of Reflex sympathetic dystrophy (08/16/2012); Subclinical hypothyroidism; Hypertension; Polycythemia; Arthritis; and Obesity.   She has no past surgical history on file.   Her family history includes Heart disease in her father.She reports that she has been smoking Cigarettes.  She has been smoking about 0.25 packs per day. She does not have any smokeless tobacco history on file. She reports that she does not drink alcohol or use illicit drugs.  Outpatient Prescriptions Prior to Visit  Medication Sig Dispense Refill  . amLODipine (NORVASC) 5 MG tablet Take 1 tablet (5 mg total) by mouth daily. 90 tablet 3  . aspirin 81 MG tablet Take 81 mg by mouth daily.    . DULoxetine (CYMBALTA) 60 MG capsule Take 1 capsule (60 mg total) by mouth daily. 90 capsule 3  . levothyroxine (SYNTHROID, LEVOTHROID) 100 MCG tablet Take 1 tablet (100 mcg total) by mouth daily before breakfast. 90 tablet 4  . nabumetone (RELAFEN) 500 MG tablet Take 1 tablet (500 mg total) by mouth 2 (two) times daily. For muscle and joint pain 120 tablet 2  . olmesartan-hydrochlorothiazide (BENICAR HCT) 40-12.5 MG per tablet Take 1 tablet by mouth daily. 90 tablet 4  . HYDROcodone-acetaminophen (NORCO) 5-325 MG per tablet Take 1 tablet by mouth every 6 (six) hours as needed for moderate pain. 30 tablet 0  . clonazePAM (KLONOPIN) 0.5 MG tablet Take 1 tablet (0.5 mg total) by mouth at bedtime as needed for anxiety. (Patient not taking: Reported on 03/10/2015) 90 tablet 1  . traMADol (ULTRAM) 50 MG tablet Take 1 tablet (50 mg total) by mouth 4 (four) times daily as needed for moderate pain. (Patient not taking: Reported on  03/10/2015) 60 tablet 02  . predniSONE (DELTASONE) 10 MG tablet Take 5 daily for 3 days followed by 4,3,2 and 1 for 3 days each. (Patient not taking: Reported on 03/10/2015) 45 tablet 0   No facility-administered medications prior to visit.    ROS Review of Systems  Constitutional: Negative for fever, chills, diaphoresis, appetite change and fatigue.  HENT: Negative for congestion, ear pain, hearing loss, postnasal drip, rhinorrhea, sore throat and trouble swallowing.   Respiratory: Negative for cough, chest tightness and shortness of breath.   Cardiovascular: Negative for chest pain and palpitations.  Gastrointestinal: Negative for abdominal pain.  Musculoskeletal: Positive for back pain and arthralgias.  Skin: Negative for rash.    Objective:  BP 114/71 mmHg  Pulse 77  Temp(Src) 98.7 F (37.1 C) (Oral)  Ht  (1.651 m)  Wt 227 lb 6.4 oz (103.148 kg)  BMI 37.84 kg/m2  BP Readings from Last 3 Encounters:  03/10/15 114/71  01/28/15 134/71  01/21/15 129/86    Wt Readings from Last 3 Encounters:  03/10/15 227 lb 6.4 oz (103.148 kg)  01/28/15 222 lb 4.8 oz (100.835 kg)  01/21/15 221 lb 9.6 oz (100.517 kg)     Physical Exam  Constitutional: She is oriented to person, place, and time. She appears well-developed and well-nourished. No distress.  HENT:  Head: Normocephalic and atraumatic.  Eyes: Conjunctivae are normal. Pupils are equal, round, and reactive to light.  Neck: Normal range of motion. Neck supple.  No thyromegaly present.  Cardiovascular: Normal rate, regular rhythm and normal heart sounds.   No murmur heard. Pulmonary/Chest: Effort normal and breath sounds normal. No respiratory distress. She has no wheezes. She has no rales.  Abdominal: Soft. Bowel sounds are normal. She exhibits no distension. There is no tenderness.  Musculoskeletal: Normal range of motion.  Lymphadenopathy:    She has no cervical adenopathy.  Neurological: She is alert and oriented to  person, place, and time.  Skin: Skin is warm and dry.  Psychiatric: She has a normal mood and affect. Her behavior is normal. Judgment and thought content normal.    No results found for: HGBA1C  Lab Results  Component Value Date   WBC 10.4* 09/23/2014   HGB 16.3* 09/23/2014   HCT 51.6* 09/23/2014   GLUCOSE 106* 09/23/2014   CHOL 175 09/23/2014   TRIG 168* 09/23/2014   HDL 53 09/23/2014   ALT 14 09/23/2014   AST 11 09/23/2014   NA 141 09/23/2014   K 4.5 09/23/2014   CL 99 09/23/2014   CREATININE 0.69 09/23/2014   BUN 12 09/23/2014   CO2 23 09/23/2014   TSH 5.120* 09/23/2014    US Aorta  07/26/2011   *RADIOLOGY REPORT*  Clinical Data:  Former smoking history, hypertension, screening for abdominal aortic aneurysm  ULTRASOUND OF ABDOMINAL AORTA  Technique:  Ultrasound examination of the abdominal aorta was performed to evaluate for abdominal aortic aneurysm.  Comparison: None.  Abdominal Aorta:  No abdominal aortic aneurysm is seen.        Maximum AP diameter:  2.7 cm.       Maximum TRV diameter:  2.6 cm.  IMPRESSION: No abdominal aortic aneurysm is noted.  Original Report Authenticated By: Juline Patch, M.D.   Assessment & Plan:   Erin Ewing was seen today for back pain.  Diagnoses and all orders for this visit:  LBP radiating to both legs -     DG Lumbar Spine 2-3 Views -     Ambulatory referral to Neurosurgery -     MR Lumbar Spine Wo Contrast; Future  Other orders -     HYDROcodone-acetaminophen (NORCO) 5-325 MG per tablet; Take 1 tablet by mouth every 6 (six) hours as needed for moderate pain.   I have discontinued Ms. Roedel's predniSONE. I am also having her maintain her aspirin, amLODipine, clonazePAM, levothyroxine, olmesartan-hydrochlorothiazide, DULoxetine, nabumetone, traMADol, and HYDROcodone-acetaminophen.  Meds ordered this encounter  Medications  . HYDROcodone-acetaminophen (NORCO) 5-325 MG per tablet    Sig: Take 1 tablet by mouth every 6 (six) hours as  needed for moderate pain.    Dispense:  30 tablet    Refill:  0   x-ray showed severe arthritis with near complete loss of disc space throughout the lumbar region We discussed the various pros and cons of the use of opiates chronically. It appears that she will be taking them long-term based on the severity of her disease. She does not want to have surgery. I concur, however we need to look at any intervention possible to avoid habituation to opiates as long as it is within reasonable safety for her.  Follow-up: Return in about 1 month (around 04/10/2015).  Mechele Claude, M.D.

## 2015-03-19 ENCOUNTER — Telehealth: Payer: Self-pay | Admitting: Family Medicine

## 2015-03-23 MED ORDER — ALPRAZOLAM 1 MG PO TABS: ORAL_TABLET | ORAL | Status: DC

## 2015-03-23 NOTE — Telephone Encounter (Signed)
Patient needs something to help with her MRI on Monday Sept 19. [facility] will not sedate her and she needs a something to help with her claustrophobia.

## 2015-03-23 NOTE — Telephone Encounter (Signed)
Xanax tablet prescription sent. Please tell patient.

## 2015-03-23 NOTE — Telephone Encounter (Signed)
Patient aware that rx was called into pharmacy.  

## 2015-03-23 NOTE — Telephone Encounter (Signed)
I sent in a Xanax tablet prescription. Please tell the patient.

## 2015-03-26 ENCOUNTER — Other Ambulatory Visit (HOSPITAL_COMMUNITY): Payer: Medicare Other

## 2015-03-30 ENCOUNTER — Ambulatory Visit (HOSPITAL_COMMUNITY)
Admission: RE | Admit: 2015-03-30 | Discharge: 2015-03-30 | Disposition: A | Discharge status: Home / Self Care | Payer: Medicare Other | Source: Ambulatory Visit | Attending: Family Medicine | Admitting: Family Medicine

## 2015-03-30 DIAGNOSIS — M79604 Pain in right leg: Secondary | ICD-10-CM

## 2015-03-30 DIAGNOSIS — M4806 Spinal stenosis, lumbar region: Secondary | ICD-10-CM | POA: Diagnosis not present

## 2015-03-30 DIAGNOSIS — M545 Low back pain: Secondary | ICD-10-CM | POA: Diagnosis not present

## 2015-04-01 ENCOUNTER — Other Ambulatory Visit: Payer: Self-pay | Admitting: Family Medicine

## 2015-04-01 ENCOUNTER — Other Ambulatory Visit: Payer: Self-pay | Admitting: *Deleted

## 2015-04-01 ENCOUNTER — Encounter: Payer: Self-pay | Admitting: Family Medicine

## 2015-04-01 ENCOUNTER — Ambulatory Visit (INDEPENDENT_AMBULATORY_CARE_PROVIDER_SITE_OTHER): Payer: Medicare Other | Admitting: Family Medicine

## 2015-04-01 VITALS — BP 128/79 | HR 79 | Temp 97.3°F | Ht 65.0 in | Wt 224.0 lb

## 2015-04-01 DIAGNOSIS — M48061 Spinal stenosis, lumbar region without neurogenic claudication: Secondary | ICD-10-CM

## 2015-04-01 DIAGNOSIS — M5416 Radiculopathy, lumbar region: Secondary | ICD-10-CM | POA: Diagnosis not present

## 2015-04-01 DIAGNOSIS — M4806 Spinal stenosis, lumbar region: Secondary | ICD-10-CM | POA: Diagnosis not present

## 2015-04-01 MED ORDER — PREGABALIN 50 MG PO CAPS: ORAL_CAPSULE | ORAL | Status: DC

## 2015-04-01 MED ORDER — HYDROCODONE-ACETAMINOPHEN 5-325 MG PO TABS
1.0000 | ORAL_TABLET | Freq: Four times a day (QID) | ORAL | Status: DC | PRN
Start: 2015-04-01 — End: 2015-04-29

## 2015-04-01 MED ORDER — PREGABALIN 50 MG PO CAPS: 50.0000 mg | ORAL_CAPSULE | Freq: Four times a day (QID) | ORAL | Status: DC

## 2015-04-01 NOTE — Telephone Encounter (Signed)
Yes, they work well together.

## 2015-04-01 NOTE — Telephone Encounter (Signed)
Done, WS. Pinted, [facility] (or call)

## 2015-04-01 NOTE — Telephone Encounter (Signed)
Pt notified Script sent into [facility] Okayed per Dr Darlyn Read

## 2015-04-01 NOTE — Telephone Encounter (Signed)
Pt took written Rx for Lyrica to [facility] pharmacy, it is going to be too expensive for her to get from them. She will get her written Rx back to mail it to [facility].  Dr. Darlyn Read - when pt starts the Lyrica should she continue to take the Relafin & Cymbalta? She forgot to ask this during her visit this morning.

## 2015-04-01 NOTE — Progress Notes (Signed)
Subjective:  Patient ID: Erin Ewing, female    DOB: 09/01/1935  Age: 79 y.o. MRN: [id number]  CC: Back Pain   HPI [name] presents for Back is the same. Taking hydrocodone daily. Pain at 7/10. Worse in the morning. This has been going on for several months to years but worsening recently. Additionally she states the pain is radiating down the lateral right buttocks and hip and thigh going into the posterior aspect of the right calf. The pain is interfering with her activities. Although she has not fallen there are times when she feels less steady. She had an MRI performed 2 days ago and today she is in for discussion of the results. That result is appended. It shows a chronic progressive dextro convex lumbar scoliosis with widespread advanced disc endplate and posterior element degeneration in the lumbar spine. Marrow edema noted in the L1-L4 level. Impression showed widespread severe lumbar spine degeneration in addition to that so scoliosis moderate spinal stenosis noted to be severe at L3-L4 and L4-L5 area moderately severe degenerative neural foraminal stenosis throughout the lumbar spine and lower right thoracic spine. Complete report appended below  History Erin Ewing has a past medical history of Reflex sympathetic dystrophy (08/16/2012); Subclinical hypothyroidism; Hypertension; Polycythemia; Arthritis; and Obesity.   She has no past surgical history on file.   Her family history includes Heart disease in her father.She reports that she has been smoking Cigarettes.  She has been smoking about 0.25 packs per day. She does not have any smokeless tobacco history on file. She reports that she does not drink alcohol or use illicit drugs.  Outpatient Prescriptions Prior to Visit  Medication Sig Dispense Refill  . amLODipine (NORVASC) 5 MG tablet Take 1 tablet (5 mg total) by mouth daily. 90 tablet 3  . aspirin 81 MG tablet Take 81 mg by mouth daily.    . DULoxetine (CYMBALTA) 60  MG capsule Take 1 capsule (60 mg total) by mouth daily. 90 capsule 3  . levothyroxine (SYNTHROID, LEVOTHROID) 100 MCG tablet Take 1 tablet (100 mcg total) by mouth daily before breakfast. 90 tablet 4  . nabumetone (RELAFEN) 500 MG tablet Take 1 tablet (500 mg total) by mouth 2 (two) times daily. For muscle and joint pain 120 tablet 2  . olmesartan-hydrochlorothiazide (BENICAR HCT) 40-12.5 MG per tablet Take 1 tablet by mouth daily. 90 tablet 4  . clonazePAM (KLONOPIN) 0.5 MG tablet Take 1 tablet (0.5 mg total) by mouth at bedtime as needed for anxiety. (Patient not taking: Reported on 03/10/2015) 90 tablet 1  . ALPRAZolam (XANAX) 1 MG tablet Take 1 tablet 1 hour before procedure as needed for anxiety (Patient not taking: Reported on 04/01/2015) 1 tablet 0  . HYDROcodone-acetaminophen (NORCO) 5-325 MG per tablet Take 1 tablet by mouth every 6 (six) hours as needed for moderate pain. (Patient not taking: Reported on 04/01/2015) 30 tablet 0  . traMADol (ULTRAM) 50 MG tablet Take 1 tablet (50 mg total) by mouth 4 (four) times daily as needed for moderate pain. (Patient not taking: Reported on 04/01/2015) 60 tablet 02   No facility-administered medications prior to visit.    ROS Review of Systems  Constitutional: Negative for fever, activity change and appetite change.  HENT: Negative for congestion, rhinorrhea and sore throat.   Eyes: Negative for pain and visual disturbance.  Respiratory: Negative for cough and shortness of breath.   Gastrointestinal: Negative for nausea and abdominal pain.  Musculoskeletal: Negative for myalgias and arthralgias.  Objective:  BP 128/79 mmHg  Pulse 79  Temp(Src) 97.3 F (36.3 C) (Oral)  Ht  (1.651 m)  Wt 224 lb (101.606 kg)  BMI 37.28 kg/m2  BP Readings from Last 3 Encounters:  04/01/15 128/79  03/10/15 114/71  01/28/15 134/71    Wt Readings from Last 3 Encounters:  04/01/15 224 lb (101.606 kg)  03/10/15 227 lb 6.4 oz (103.148 kg)  01/28/15 222  lb 4.8 oz (100.835 kg)     Physical Exam  Constitutional: She is oriented to person, place, and time. She appears well-developed and well-nourished. No distress.  HENT:  Head: Normocephalic and atraumatic.  Eyes: Conjunctivae are normal. Pupils are equal, round, and reactive to light.  Neck: Normal range of motion. Neck supple. No thyromegaly present.  Cardiovascular: Normal rate, regular rhythm and normal heart sounds.   No murmur heard. Pulmonary/Chest: Effort normal and breath sounds normal. No respiratory distress. She has no wheezes. She has no rales.  Abdominal: Soft. Bowel sounds are normal. She exhibits no distension. There is no tenderness.  Musculoskeletal: She exhibits tenderness (For percussion of theSpinous processes and paraspinous tissues).  Lymphadenopathy:    She has no cervical adenopathy.  Neurological: She is alert and oriented to person, place, and time.  Skin: Skin is warm and dry.  Psychiatric: She has a normal mood and affect. Her behavior is normal. Judgment and thought content normal.      Lab Results  Component Value Date   WBC 10.4* 09/23/2014   HGB 16.3* 09/23/2014   HCT 51.6* 09/23/2014   GLUCOSE 106* 09/23/2014   CHOL 175 09/23/2014   TRIG 168* 09/23/2014   HDL 53 09/23/2014   ALT 14 09/23/2014   AST 11 09/23/2014   NA 141 09/23/2014   K 4.5 09/23/2014   CL 99 09/23/2014   CREATININE 0.69 09/23/2014   BUN 12 09/23/2014   CO2 23 09/23/2014   TSH 5.120* 09/23/2014    Mr Lumbar Spine Wo Contrast  03/30/2015   CLINICAL DATA:  79 year old female with lumbar back pain radiating to the left hip for 4 months with no known injury. Initial encounter. Scoliosis.  EXAM: MRI LUMBAR SPINE WITHOUT CONTRAST  TECHNIQUE: Multiplanar, multisequence MR imaging of the lumbar spine was performed. No intravenous contrast was administered.  COMPARISON:  [facility] CT Abdomen and Pelvis 05/05/2009. [facility] lumbar  radiographs 03/10/2015.  FINDINGS: Chronic progressive dextro convex lumbar scoliosis is associated with widespread advanced disc, endplate, and posterior element degeneration in the lumbar spine. Normal lumbar segmentation. Degenerative appearing posterior lumbar endplate marrow edema from the L1 to the L4 level. No acute osseous abnormality identified.  No signal abnormality in the visualized lower thoracic spinal cord. There does appear to be mild mass effect on the conus at L1-L2 due to spinal stenosis at that level, see below.  Stable visualized abdominal viscera.  T11-12: Anterior eccentric disc osteophyte complex with up to moderate facet hypertrophy. No spinal stenosis. Moderate right and mild left T11 foraminal stenosis.  T12-L1: Right eccentric circumferential disc bulge. Up to moderate facet and ligament flavum hypertrophy. Borderline to mild spinal stenosis. Moderate right T12 foraminal stenosis.  L1-L2: Severe disc space loss. Bulky and lobulated circumferential disc osteophyte complex. Up to moderate facet and ligament flavum hypertrophy. Moderate spinal stenosis (series 3, image 9). Moderate right greater than left L1 foraminal stenosis.  L2-L3: Severe disc space loss. Bulky and lobulated left eccentric circumferential disc osteophyte complex. Up to moderate facet and ligament  flavum hypertrophy. Moderate spinal stenosis (series 6, image 23). Severe left and mild right L2 foraminal stenosis.  L3-L4: Bulky circumferential disc osteophyte complex. Moderate to severe facet and ligament flavum hypertrophy. Moderate to severe spinal and left lateral recess stenosis. Moderate to severe bilateral L3 foraminal stenosis worse on the left.  L4-L5: Right eccentric circumferential disc osteophyte complex. Severe right greater than left facet and ligament flavum hypertrophy. Mild to moderate spinal stenosis with severe right and moderate left lateral recess stenosis and L4 foraminal stenosis.  L5-S1: Less  pronounced circumferential disc osteophyte complex, eccentric to the right. Severe facet and ligament flavum hypertrophy. Facet joint fluid bilaterally. Posteriorly situated synovial cysts greater on the left. Mild spinal and up to moderate right lateral recess stenosis. Moderate to severe bilateral L5 foraminal stenosis, greater on the left.  IMPRESSION: 1. Widespread and severe lumbar spine degeneration in the setting of progressive dextro convex lumbar scoliosis. 2. Moderate spinal stenosis from L1-L2 to L4-L5, up to severe at some levels including at the left lateral recess of L3-L4, and right lateral recess of L4-L5. 3. Moderate or severe degenerative neural foraminal stenosis throughout the lumbar spine and involving the right side lower thoracic foramina.   Electronically Signed   By: Odessa Fleming M.D.   On: 03/30/2015 17:21    Assessment & Plan:   Dvora was seen today for back pain.  Diagnoses and all orders for this visit:  Spinal stenosis of lumbar region  Lumbar radiculopathy  Other orders -     HYDROcodone-acetaminophen (NORCO) 5-325 MG per tablet; Take 1 tablet by mouth every 6 (six) hours as needed for moderate pain. -     Discontinue: pregabalin (LYRICA) 50 MG capsule; One daily for one week. Then 2 daily X 1 wk. Then 3qd X 1wk. Then 4 daily.   I have discontinued Ms. Crossan's traMADol and ALPRAZolam. I am also having her maintain her aspirin, amLODipine, clonazePAM, levothyroxine, olmesartan-hydrochlorothiazide, DULoxetine, nabumetone, and HYDROcodone-acetaminophen.  Meds ordered this encounter  Medications  . HYDROcodone-acetaminophen (NORCO) 5-325 MG per tablet    Sig: Take 1 tablet by mouth every 6 (six) hours as needed for moderate pain.    Dispense:  30 tablet    Refill:  0  . DISCONTD: pregabalin (LYRICA) 50 MG capsule    Sig: One daily for one week. Then 2 daily X 1 wk. Then 3qd X 1wk. Then 4 daily.    Dispense:  120 capsule    Refill:  1     Follow-up: Return in  about 1 month (around 05/01/2015) for Hypothyroidism, Pain, Arthritis.  Mechele Claude, M.D.

## 2015-04-02 ENCOUNTER — Telehealth: Payer: Self-pay

## 2015-04-02 NOTE — Telephone Encounter (Signed)
Insurance excludes Lyrica in it's coverage

## 2015-04-03 MED ORDER — GABAPENTIN 100 MG PO CAPS: ORAL_CAPSULE | ORAL | Status: DC

## 2015-04-03 NOTE — Telephone Encounter (Signed)
I will send in a gabapentin prescription to take its place. Prescription sent to [facility].

## 2015-04-08 ENCOUNTER — Telehealth: Payer: Self-pay | Admitting: Family Medicine

## 2015-04-10 ENCOUNTER — Ambulatory Visit: Payer: Medicare Other | Admitting: Family Medicine

## 2015-04-11 ENCOUNTER — Telehealth: Payer: Self-pay | Admitting: Family Medicine

## 2015-04-13 NOTE — Telephone Encounter (Signed)
Left detailed mssg on VM that the Lyrica was changed to gabapentin and sent to pharmacy since insurance does not cover the Lyrica.

## 2015-04-13 NOTE — Telephone Encounter (Signed)
Closing encounter. Medication was changed to a covered med in the 9/22 telephone encounter.

## 2015-04-20 ENCOUNTER — Other Ambulatory Visit: Payer: Self-pay | Admitting: Family Medicine

## 2015-04-20 NOTE — Telephone Encounter (Signed)
Spoke with pt regarding Lyrica authorization Will call ins to check and get back with the pt

## 2015-04-20 NOTE — Telephone Encounter (Signed)
This is about her lyrica prior auth

## 2015-04-29 ENCOUNTER — Ambulatory Visit (INDEPENDENT_AMBULATORY_CARE_PROVIDER_SITE_OTHER): Payer: Medicare Other | Admitting: Family Medicine

## 2015-04-29 ENCOUNTER — Encounter: Payer: Self-pay | Admitting: Family Medicine

## 2015-04-29 VITALS — BP 120/76 | HR 77 | Temp 97.2°F | Ht 65.0 in | Wt 220.2 lb

## 2015-04-29 DIAGNOSIS — M1990 Unspecified osteoarthritis, unspecified site: Secondary | ICD-10-CM

## 2015-04-29 DIAGNOSIS — Z23 Encounter for immunization: Secondary | ICD-10-CM | POA: Diagnosis not present

## 2015-04-29 DIAGNOSIS — E669 Obesity, unspecified: Secondary | ICD-10-CM | POA: Diagnosis not present

## 2015-04-29 DIAGNOSIS — I10 Essential (primary) hypertension: Secondary | ICD-10-CM

## 2015-04-29 DIAGNOSIS — G4700 Insomnia, unspecified: Secondary | ICD-10-CM

## 2015-04-29 DIAGNOSIS — M170 Bilateral primary osteoarthritis of knee: Secondary | ICD-10-CM

## 2015-04-29 DIAGNOSIS — M129 Arthropathy, unspecified: Secondary | ICD-10-CM | POA: Diagnosis not present

## 2015-04-29 MED ORDER — TRAZODONE HCL 150 MG PO TABS: ORAL_TABLET | ORAL | Status: DC

## 2015-04-29 NOTE — Progress Notes (Signed)
Subjective:  Patient ID: Erin Ewing, female    DOB: 1936-05-30  Age: 79 y.o. MRN: [id number]  CC: Back Pain   HPI Erin Soxatricia S Dechaine presents for continued pain in the knees and lower back. Pt. Doubled relafen. Better pain relief. Lost 16 lb. That helped too. Feels in control of her situation. Never took the clonazepam. Couldn't get the lyrica or gaba gabapentin due to insurance exclusions. She feels that the hydrocodone does not give her any pain relief at all. Not taking that either. Prefers not to take tramadol. Currently her knee pain on a scale of 1-10 is 4/10 back pain is 2/10. Her only medications for her joint pains currently are the Relafen and the duloxetine. She would like to stop the duloxetine.  Needs help with sleep. Tylenol PM didn't help. Awake from 10 PM to 4 AM last night.  Two cups of coffee in the AM is only caffeine. She is frequently awakes several hours during the night.   follow-up of hypertension. Patient has no history of headache chest pain or shortness of breath or recent cough. Patient also denies symptoms of TIA such as numbness weakness lateralizing. Patient checks  blood pressure at home and has not had any elevated readings recently. Patient denies side effects from his medication. States taking it regularly.  History Elease Hashimotoatricia has a past medical history of Reflex sympathetic dystrophy (08/16/2012); Subclinical hypothyroidism; Hypertension; Polycythemia; Arthritis; and Obesity.   She has no past surgical history on file.   Her family history includes Heart disease in her father.She reports that she has been smoking Cigarettes.  She has been smoking about 0.25 packs per day. She does not have any smokeless tobacco history on file. She reports that she does not drink alcohol or use illicit drugs.  Outpatient Prescriptions Prior to Visit  Medication Sig Dispense Refill  . amLODipine (NORVASC) 5 MG tablet Take 1 tablet (5 mg total) by mouth daily. 90 tablet 3    . aspirin 81 MG tablet Take 81 mg by mouth daily.    . DULoxetine (CYMBALTA) 60 MG capsule Take 1 capsule (60 mg total) by mouth daily. 90 capsule 3  . levothyroxine (SYNTHROID, LEVOTHROID) 100 MCG tablet Take 1 tablet (100 mcg total) by mouth daily before breakfast. 90 tablet 4  . nabumetone (RELAFEN) 500 MG tablet Take 1 tablet (500 mg total) by mouth 2 (two) times daily. For muscle and joint pain 120 tablet 2  . olmesartan-hydrochlorothiazide (BENICAR HCT) 40-12.5 MG per tablet Take 1 tablet by mouth daily. 90 tablet 4  . HYDROcodone-acetaminophen (NORCO) 5-325 MG per tablet Take 1 tablet by mouth every 6 (six) hours as needed for moderate pain. 30 tablet 0  . clonazePAM (KLONOPIN) 0.5 MG tablet Take 1 tablet (0.5 mg total) by mouth at bedtime as needed for anxiety. (Patient not taking: Reported on 04/29/2015) 90 tablet 1  . gabapentin (NEURONTIN) 100 MG capsule Wanted bedtime for 3 nights, then 2 daily at bedtime 3 then 3 daily at bedtime 3 then 4 daily at bedtime (Patient not taking: Reported on 04/29/2015) 120 capsule 1  . pregabalin (LYRICA) 50 MG capsule Take 1 capsule (50 mg total) by mouth 4 (four) times daily. (Patient not taking: Reported on 04/29/2015) 360 capsule 0   No facility-administered medications prior to visit.    ROS Review of Systems  Objective:  BP 120/76 mmHg  Pulse 77  Temp(Src) 97.2 F (36.2 C) (Oral)  Ht 5\' 5"  (1.651 m)  Wt 220  lb 3.2 oz (99.882 kg)  BMI 36.64 kg/m2  BP Readings from Last 3 Encounters:  04/29/15 120/76  04/01/15 128/79  03/10/15 114/71    Wt Readings from Last 3 Encounters:  04/29/15 220 lb 3.2 oz (99.882 kg)  04/01/15 224 lb (101.606 kg)  03/10/15 227 lb 6.4 oz (103.148 kg)     Physical Exam  No results found for: HGBA1C  Lab Results  Component Value Date   WBC 10.4* 09/23/2014   HGB 16.3* 09/23/2014   HCT 51.6* 09/23/2014   GLUCOSE 106* 09/23/2014   CHOL 175 09/23/2014   TRIG 168* 09/23/2014   HDL 53 09/23/2014    ALT 14 09/23/2014   AST 11 09/23/2014   NA 141 09/23/2014   K 4.5 09/23/2014   CL 99 09/23/2014   CREATININE 0.69 09/23/2014   BUN 12 09/23/2014   CO2 23 09/23/2014   TSH 5.120* 09/23/2014    Mr Lumbar Spine Wo Contrast  03/30/2015  CLINICAL DATA:  79 year old female with lumbar back pain radiating to the left hip for 4 months with no known injury. Initial encounter. Scoliosis. EXAM: MRI LUMBAR SPINE WITHOUT CONTRAST TECHNIQUE: Multiplanar, multisequence MR imaging of the lumbar spine was performed. No intravenous contrast was administered. COMPARISON:  [facility] CT Abdomen and Pelvis 05/05/2009. [facility] lumbar radiographs 03/10/2015. FINDINGS: Chronic progressive dextro convex lumbar scoliosis is associated with widespread advanced disc, endplate, and posterior element degeneration in the lumbar spine. Normal lumbar segmentation. Degenerative appearing posterior lumbar endplate marrow edema from the L1 to the L4 level. No acute osseous abnormality identified. No signal abnormality in the visualized lower thoracic spinal cord. There does appear to be mild mass effect on the conus at L1-L2 due to spinal stenosis at that level, see below. Stable visualized abdominal viscera. T11-12: Anterior eccentric disc osteophyte complex with up to moderate facet hypertrophy. No spinal stenosis. Moderate right and mild left T11 foraminal stenosis. T12-L1: Right eccentric circumferential disc bulge. Up to moderate facet and ligament flavum hypertrophy. Borderline to mild spinal stenosis. Moderate right T12 foraminal stenosis. L1-L2: Severe disc space loss. Bulky and lobulated circumferential disc osteophyte complex. Up to moderate facet and ligament flavum hypertrophy. Moderate spinal stenosis (series 3, image 9). Moderate right greater than left L1 foraminal stenosis. L2-L3: Severe disc space loss. Bulky and lobulated left eccentric circumferential disc osteophyte complex. Up  to moderate facet and ligament flavum hypertrophy. Moderate spinal stenosis (series 6, image 23). Severe left and mild right L2 foraminal stenosis. L3-L4: Bulky circumferential disc osteophyte complex. Moderate to severe facet and ligament flavum hypertrophy. Moderate to severe spinal and left lateral recess stenosis. Moderate to severe bilateral L3 foraminal stenosis worse on the left. L4-L5: Right eccentric circumferential disc osteophyte complex. Severe right greater than left facet and ligament flavum hypertrophy. Mild to moderate spinal stenosis with severe right and moderate left lateral recess stenosis and L4 foraminal stenosis. L5-S1: Less pronounced circumferential disc osteophyte complex, eccentric to the right. Severe facet and ligament flavum hypertrophy. Facet joint fluid bilaterally. Posteriorly situated synovial cysts greater on the left. Mild spinal and up to moderate right lateral recess stenosis. Moderate to severe bilateral L5 foraminal stenosis, greater on the left. IMPRESSION: 1. Widespread and severe lumbar spine degeneration in the setting of progressive dextro convex lumbar scoliosis. 2. Moderate spinal stenosis from L1-L2 to L4-L5, up to severe at some levels including at the left lateral recess of L3-L4, and right lateral recess of L4-L5. 3. Moderate or severe degenerative neural  foraminal stenosis throughout the lumbar spine and involving the right side lower thoracic foramina. Electronically Signed   By: Odessa Fleming M.D.   On: 03/30/2015 17:21    Assessment & Plan:   Rashanda was seen today for back pain.  Diagnoses and all orders for this visit:  Arthritis  Arthritis of both knees  Essential hypertension  Obesity  Encounter for immunization  Insomnia  Other orders -     Flu Vaccine QUAD 36+ mos IM -     traZODone (DESYREL) 150 MG tablet; Take from one third pill to one pill nightly as needed for sleep   I have discontinued Ms. Staller's clonazePAM,  HYDROcodone-acetaminophen, pregabalin, gabapentin, and traMADol. I am also having her start on traZODone. Additionally, I am having her maintain her aspirin, amLODipine, levothyroxine, olmesartan-hydrochlorothiazide, DULoxetine, and nabumetone.  Meds ordered this encounter  Medications  . DISCONTD: traMADol (ULTRAM) 50 MG tablet    Sig:   . traZODone (DESYREL) 150 MG tablet    Sig: Take from one third pill to one pill nightly as needed for sleep    Dispense:  30 tablet    Refill:  2   ewight loss 30 lbs in 6 mos  Follow-up: Return in about 6 months (around 10/28/2015).  Mechele Claude, M.D.

## 2015-05-08 ENCOUNTER — Encounter: Payer: Self-pay | Admitting: *Deleted

## 2015-05-20 ENCOUNTER — Ambulatory Visit: Payer: Medicare Other | Admitting: Family Medicine

## 2015-05-21 ENCOUNTER — Ambulatory Visit: Payer: Medicare Other | Admitting: Family Medicine

## 2015-06-28 ENCOUNTER — Other Ambulatory Visit: Payer: Self-pay | Admitting: Family Medicine

## 2015-07-02 ENCOUNTER — Telehealth: Payer: Self-pay | Admitting: Family Medicine

## 2015-07-02 NOTE — Telephone Encounter (Signed)
Pt is taking Relafen 500mg  2 tablets BiD Wants script sent into [facility] Please advise

## 2015-07-07 MED ORDER — NABUMETONE 500 MG PO TABS: 1000.0000 mg | ORAL_TABLET | Freq: Two times a day (BID) | ORAL | Status: DC

## 2015-07-07 NOTE — Telephone Encounter (Signed)
Scrip sent as requested. Please tell pt. Thanks, WS

## 2015-07-16 ENCOUNTER — Telehealth: Payer: Self-pay | Admitting: Family Medicine

## 2015-07-16 NOTE — Telephone Encounter (Signed)
Stp and advised her nabumetone was sent to [facility] 07/07/15. Pt voiced understanding.

## 2015-07-29 NOTE — Telephone Encounter (Signed)
RX sent 07-07-15

## 2015-10-28 ENCOUNTER — Ambulatory Visit: Payer: Medicare Other | Admitting: Family Medicine

## 2015-11-17 ENCOUNTER — Ambulatory Visit (INDEPENDENT_AMBULATORY_CARE_PROVIDER_SITE_OTHER): Payer: Medicare Other | Admitting: Family Medicine

## 2015-11-17 ENCOUNTER — Encounter (INDEPENDENT_AMBULATORY_CARE_PROVIDER_SITE_OTHER): Payer: Self-pay

## 2015-11-17 ENCOUNTER — Encounter: Payer: Self-pay | Admitting: Family Medicine

## 2015-11-17 VITALS — BP 123/67 | HR 70 | Temp 97.1°F | Ht 65.0 in | Wt 214.0 lb

## 2015-11-17 DIAGNOSIS — E039 Hypothyroidism, unspecified: Secondary | ICD-10-CM

## 2015-11-17 DIAGNOSIS — I10 Essential (primary) hypertension: Secondary | ICD-10-CM

## 2015-11-17 DIAGNOSIS — D751 Secondary polycythemia: Secondary | ICD-10-CM | POA: Diagnosis not present

## 2015-11-17 DIAGNOSIS — M1990 Unspecified osteoarthritis, unspecified site: Secondary | ICD-10-CM | POA: Diagnosis not present

## 2015-11-17 MED ORDER — LEVOTHYROXINE SODIUM 100 MCG PO TABS: 100.0000 ug | ORAL_TABLET | Freq: Every day | ORAL | Status: DC

## 2015-11-17 MED ORDER — AMLODIPINE BESYLATE 5 MG PO TABS: 5.0000 mg | ORAL_TABLET | Freq: Every day | ORAL | Status: DC

## 2015-11-17 MED ORDER — OLMESARTAN MEDOXOMIL-HCTZ 40-12.5 MG PO TABS: 1.0000 | ORAL_TABLET | Freq: Every day | ORAL | Status: DC

## 2015-11-17 MED ORDER — DULOXETINE HCL 60 MG PO CPEP: 60.0000 mg | ORAL_CAPSULE | Freq: Every day | ORAL | Status: DC

## 2015-11-17 NOTE — Progress Notes (Signed)
Subjective:  Patient ID: Erin Ewing, female    DOB: [birth year]  Age: 80 y.o. MRN: [id number]  CC: Hypertension; Hyperlipidemia; Back Pain; and Hypothyroidism   HPI Erin Ewing presents for  follow-up of hypertension. Patient has no history of headache chest pain or shortness of breath or recent cough. Patient also denies symptoms of TIA such as numbness weakness lateralizing. Patient checks  blood pressure at home and has not had any elevated readings recently. Patient denies side effects from medication. States taking it regularly.  Patient presents for follow-up on  thyroid. The patient has a history of hypothyroidism for many years. It has been stable recently. Pt. denies any change in  voice, loss of hair, heat or cold intolerance. Energy level has been adequate to good. Patient denies constipation and diarrhea. No myxedema. Medication is as noted below. Verified that pt is taking it daily on an empty stomach. Well tolerated.  Patient in for follow-up of elevated cholesterol. Doing well without complaints on current medication. Denies side effects of statin including myalgia and arthralgia and nausea. Also in today for liver function testing. Currently no chest pain, shortness of breath or other cardiovascular related symptoms noted.  Patient states that her pain has remitted significantly. It is no longer limiting her. She states that she never did get her prescription for nabumetone from [facility] but now she doesn't need it anyway.   History Erin Ewing has a past medical history of Reflex sympathetic dystrophy (08/16/2012); Subclinical hypothyroidism; Hypertension; Polycythemia; Arthritis; and Obesity.   She has no past surgical history on file.   Her family history includes Heart disease in her father.She reports that she has been smoking Cigarettes.  She has been smoking about 0.25 packs per day. She does not have any smokeless tobacco history on file. She reports that  she does not drink alcohol or use illicit drugs.  Current Outpatient Prescriptions on File Prior to Visit  Medication Sig Dispense Refill  . aspirin 81 MG tablet Take 81 mg by mouth daily.    . nabumetone (RELAFEN) 500 MG tablet Take 2 tablets (1,000 mg total) by mouth 2 (two) times daily. 360 tablet 3   No current facility-administered medications on file prior to visit.    ROS Review of Systems  Constitutional: Negative for fever, activity change and appetite change.  HENT: Negative for congestion, rhinorrhea and sore throat.   Eyes: Negative for visual disturbance.  Respiratory: Negative for cough and shortness of breath.   Cardiovascular: Negative for chest pain and palpitations.  Gastrointestinal: Negative for nausea, abdominal pain and diarrhea.  Genitourinary: Negative for dysuria.  Musculoskeletal: Positive for arthralgias. Negative for myalgias.    Objective:  BP 123/67 mmHg  Pulse 70  Temp(Src) 97.1 F (36.2 C) (Oral)  Ht '5\' 5"'  (1.651 m)  Wt 214 lb (97.07 kg)  BMI 35.61 kg/m2  SpO2 98%  BP Readings from Last 3 Encounters:  11/17/15 123/67  04/29/15 120/76  04/01/15 128/79    Wt Readings from Last 3 Encounters:  11/17/15 214 lb (97.07 kg)  04/29/15 220 lb 3.2 oz (99.882 kg)  04/01/15 224 lb (101.606 kg)     Physical Exam  Constitutional: She is oriented to person, place, and time. She appears well-developed and well-nourished. No distress.  HENT:  Head: Normocephalic and atraumatic.  Right Ear: External ear normal.  Left Ear: External ear normal.  Nose: Nose normal.  Mouth/Throat: Oropharynx is clear and moist.  Eyes: Conjunctivae and EOM are normal.  Pupils are equal, round, and reactive to light.  Neck: Normal range of motion. Neck supple. No thyromegaly present.  Cardiovascular: Normal rate, regular rhythm and normal heart sounds.   No murmur heard. Pulmonary/Chest: Effort normal and breath sounds normal. No respiratory distress. She has no wheezes.  She has no rales.  Abdominal: Soft. Bowel sounds are normal. She exhibits no distension. There is no tenderness.  Lymphadenopathy:    She has no cervical adenopathy.  Neurological: She is alert and oriented to person, place, and time. She has normal reflexes.  Skin: Skin is warm and dry.  Psychiatric: She has a normal mood and affect. Her behavior is normal. Judgment and thought content normal.     Lab Results  Component Value Date   WBC 10.4* 09/23/2014   HGB 16.3* 09/23/2014   HCT 51.6* 09/23/2014   GLUCOSE 106* 09/23/2014   CHOL 175 09/23/2014   TRIG 168* 09/23/2014   HDL 53 09/23/2014   ALT 14 09/23/2014   AST 11 09/23/2014   NA 141 09/23/2014   K 4.5 09/23/2014   CL 99 09/23/2014   CREATININE 0.69 09/23/2014   BUN 12 09/23/2014   CO2 23 09/23/2014   TSH 5.120* 09/23/2014    Mr Lumbar Spine Wo Contrast  03/30/2015  CLINICAL DATA:  80 year old female with lumbar back pain radiating to the left hip for 4 months with no known injury. Initial encounter. Scoliosis. EXAM: MRI LUMBAR SPINE WITHOUT CONTRAST TECHNIQUE: Multiplanar, multisequence MR imaging of the lumbar spine was performed. No intravenous contrast was administered. COMPARISON:  [facility] CT Abdomen and Pelvis 05/05/2009. [facility] lumbar radiographs 03/10/2015. FINDINGS: Chronic progressive dextro convex lumbar scoliosis is associated with widespread advanced disc, endplate, and posterior element degeneration in the lumbar spine. Normal lumbar segmentation. Degenerative appearing posterior lumbar endplate marrow edema from the L1 to the L4 level. No acute osseous abnormality identified. No signal abnormality in the visualized lower thoracic spinal cord. There does appear to be mild mass effect on the conus at L1-L2 due to spinal stenosis at that level, see below. Stable visualized abdominal viscera. T11-12: Anterior eccentric disc osteophyte complex with up to moderate facet  hypertrophy. No spinal stenosis. Moderate right and mild left T11 foraminal stenosis. T12-L1: Right eccentric circumferential disc bulge. Up to moderate facet and ligament flavum hypertrophy. Borderline to mild spinal stenosis. Moderate right T12 foraminal stenosis. L1-L2: Severe disc space loss. Bulky and lobulated circumferential disc osteophyte complex. Up to moderate facet and ligament flavum hypertrophy. Moderate spinal stenosis (series 3, image 9). Moderate right greater than left L1 foraminal stenosis. L2-L3: Severe disc space loss. Bulky and lobulated left eccentric circumferential disc osteophyte complex. Up to moderate facet and ligament flavum hypertrophy. Moderate spinal stenosis (series 6, image 23). Severe left and mild right L2 foraminal stenosis. L3-L4: Bulky circumferential disc osteophyte complex. Moderate to severe facet and ligament flavum hypertrophy. Moderate to severe spinal and left lateral recess stenosis. Moderate to severe bilateral L3 foraminal stenosis worse on the left. L4-L5: Right eccentric circumferential disc osteophyte complex. Severe right greater than left facet and ligament flavum hypertrophy. Mild to moderate spinal stenosis with severe right and moderate left lateral recess stenosis and L4 foraminal stenosis. L5-S1: Less pronounced circumferential disc osteophyte complex, eccentric to the right. Severe facet and ligament flavum hypertrophy. Facet joint fluid bilaterally. Posteriorly situated synovial cysts greater on the left. Mild spinal and up to moderate right lateral recess stenosis. Moderate to severe bilateral L5 foraminal stenosis, greater on  the left. IMPRESSION: 1. Widespread and severe lumbar spine degeneration in the setting of progressive dextro convex lumbar scoliosis. 2. Moderate spinal stenosis from L1-L2 to L4-L5, up to severe at some levels including at the left lateral recess of L3-L4, and right lateral recess of L4-L5. 3. Moderate or severe degenerative  neural foraminal stenosis throughout the lumbar spine and involving the right side lower thoracic foramina. Electronically Signed   By: Genevie Ann M.D.   On: 03/30/2015 17:21    Assessment & Plan:   Erin Ewing was seen today for hypertension, hyperlipidemia, back pain and hypothyroidism.  Diagnoses and all orders for this visit:  Hypothyroidism, unspecified hypothyroidism type -     CBC with Differential/Platelet -     CMP14+EGFR -     Lipid panel -     TSH + free T4  Essential hypertension -     CBC with Differential/Platelet -     CMP14+EGFR -     Lipid panel -     TSH + free T4 -     amLODipine (NORVASC) 5 MG tablet; Take 1 tablet (5 mg total) by mouth daily. -     olmesartan-hydrochlorothiazide (BENICAR HCT) 40-12.5 MG tablet; Take 1 tablet by mouth daily.  Arthritis -     CBC with Differential/Platelet -     CMP14+EGFR -     Lipid panel -     TSH + free T4  Polycythemia, secondary -     CBC with Differential/Platelet -     CMP14+EGFR -     Lipid panel -     TSH + free T4  Other orders -     DULoxetine (CYMBALTA) 60 MG capsule; Take 1 capsule (60 mg total) by mouth daily. -     levothyroxine (SYNTHROID, LEVOTHROID) 100 MCG tablet; Take 1 tablet (100 mcg total) by mouth daily before breakfast.   I have discontinued Erin Ewing's traZODone. I have also changed her olmesartan-hydrochlorothiazide. Additionally, I am having her maintain her aspirin, nabumetone, amLODipine, DULoxetine, and levothyroxine.  Meds ordered this encounter  Medications  . amLODipine (NORVASC) 5 MG tablet    Sig: Take 1 tablet (5 mg total) by mouth daily.    Dispense:  90 tablet    Refill:  3  . DULoxetine (CYMBALTA) 60 MG capsule    Sig: Take 1 capsule (60 mg total) by mouth daily.    Dispense:  90 capsule    Refill:  3  . levothyroxine (SYNTHROID, LEVOTHROID) 100 MCG tablet    Sig: Take 1 tablet (100 mcg total) by mouth daily before breakfast.    Dispense:  90 tablet    Refill:  4  .  olmesartan-hydrochlorothiazide (BENICAR HCT) 40-12.5 MG tablet    Sig: Take 1 tablet by mouth daily.    Dispense:  90 tablet    Refill:  4      Follow-up: Return in about 6 months (around 05/19/2016) for CPE.  Claretta Fraise, M.D.

## 2015-11-18 LAB — CMP14+EGFR
ALT: 18 IU/L (ref 0–32)
AST: 12 IU/L (ref 0–40)
Albumin/Globulin Ratio: 1.7 (ref 1.2–2.2)
Albumin: 4.3 g/dL (ref 3.5–4.8)
Alkaline Phosphatase: 79 IU/L (ref 39–117)
BUN/Creatinine Ratio: 20 (ref 12–28)
BUN: 14 mg/dL (ref 8–27)
Bilirubin Total: 0.4 mg/dL (ref 0.0–1.2)
CO2: 23 mmol/L (ref 18–29)
Calcium: 9.9 mg/dL (ref 8.7–10.3)
Chloride: 99 mmol/L (ref 96–106)
Creatinine, Ser: 0.7 mg/dL (ref 0.57–1.00)
GFR calc Af Amer: 97 mL/min/{1.73_m2} (ref >59)
GFR calc non Af Amer: 84 mL/min/{1.73_m2} (ref >59)
Globulin, Total: 2.6 g/dL (ref 1.5–4.5)
Glucose: 103 mg/dL — ABNORMAL HIGH (ref 65–99)
Potassium: 4.7 mmol/L (ref 3.5–5.2)
Sodium: 140 mmol/L (ref 134–144)
Total Protein: 6.9 g/dL (ref 6.0–8.5)

## 2015-11-18 LAB — CBC WITH DIFFERENTIAL/PLATELET
Basophils Absolute: 0 10*3/uL (ref 0.0–0.2)
Basos: 0 %
EOS (ABSOLUTE): 0.2 10*3/uL (ref 0.0–0.4)
Eos: 3 %
Hematocrit: 47.3 % — ABNORMAL HIGH (ref 34.0–46.6)
Hemoglobin: 15.7 g/dL (ref 11.1–15.9)
Immature Grans (Abs): 0 10*3/uL (ref 0.0–0.1)
Immature Granulocytes: 0 %
Lymphocytes Absolute: 2 10*3/uL (ref 0.7–3.1)
Lymphs: 27 %
MCH: 30.6 pg (ref 26.6–33.0)
MCHC: 33.2 g/dL (ref 31.5–35.7)
MCV: 92 fL (ref 79–97)
Monocytes Absolute: 0.5 10*3/uL (ref 0.1–0.9)
Monocytes: 6 %
Neutrophils Absolute: 4.9 10*3/uL (ref 1.4–7.0)
Neutrophils: 64 %
Platelets: 292 10*3/uL (ref 150–379)
RBC: 5.13 x10E6/uL (ref 3.77–5.28)
RDW: 14.3 % (ref 12.3–15.4)
WBC: 7.7 10*3/uL (ref 3.4–10.8)

## 2015-11-18 LAB — LIPID PANEL
Chol/HDL Ratio: 3.6 ratio units (ref 0.0–4.4)
Cholesterol, Total: 193 mg/dL (ref 100–199)
HDL: 53 mg/dL (ref >39)
LDL Calculated: 107 mg/dL — ABNORMAL HIGH (ref 0–99)
Triglycerides: 165 mg/dL — ABNORMAL HIGH (ref 0–149)
VLDL Cholesterol Cal: 33 mg/dL (ref 5–40)

## 2015-11-18 LAB — TSH+FREE T4
Free T4: 1.69 ng/dL (ref 0.82–1.77)
TSH: 2.8 u[IU]/mL (ref 0.450–4.500)

## 2016-04-18 ENCOUNTER — Ambulatory Visit: Payer: Medicare Other

## 2016-04-22 ENCOUNTER — Ambulatory Visit (INDEPENDENT_AMBULATORY_CARE_PROVIDER_SITE_OTHER): Payer: Medicare Other

## 2016-04-22 DIAGNOSIS — Z23 Encounter for immunization: Secondary | ICD-10-CM

## 2016-05-24 ENCOUNTER — Encounter: Payer: Medicare Other | Admitting: Family Medicine

## 2016-07-18 ENCOUNTER — Ambulatory Visit: Payer: Medicare Other | Admitting: Family Medicine

## 2016-08-04 ENCOUNTER — Telehealth: Payer: Self-pay | Admitting: Family Medicine

## 2016-08-04 DIAGNOSIS — I10 Essential (primary) hypertension: Secondary | ICD-10-CM

## 2016-08-04 MED ORDER — LEVOTHYROXINE SODIUM 100 MCG PO TABS: 100.0000 ug | ORAL_TABLET | Freq: Every day | ORAL | 4 refills | Status: DC

## 2016-08-04 MED ORDER — OLMESARTAN MEDOXOMIL-HCTZ 40-12.5 MG PO TABS: 1.0000 | ORAL_TABLET | Freq: Every day | ORAL | 4 refills | Status: DC

## 2016-08-04 MED ORDER — DULOXETINE HCL 60 MG PO CPEP: 60.0000 mg | ORAL_CAPSULE | Freq: Every day | ORAL | 3 refills | Status: DC

## 2016-08-04 MED ORDER — AMLODIPINE BESYLATE 5 MG PO TABS: 5.0000 mg | ORAL_TABLET | Freq: Every day | ORAL | 3 refills | Status: DC

## 2016-08-04 NOTE — Telephone Encounter (Signed)
Okay at current level for 6 mos. Thanks ws 

## 2016-08-04 NOTE — Telephone Encounter (Signed)
Medications sent to pharmacy

## 2016-08-04 NOTE — Telephone Encounter (Signed)
Pt requesting refill on Nabumetone Please advise Has appt on 08/26/2016

## 2016-08-13 ENCOUNTER — Telehealth: Payer: Self-pay | Admitting: Family Medicine

## 2016-08-14 MED ORDER — NABUMETONE 500 MG PO TABS: 1000.0000 mg | ORAL_TABLET | Freq: Two times a day (BID) | ORAL | 3 refills | Status: DC

## 2016-08-14 NOTE — Telephone Encounter (Signed)
I sent in the requested prescription 

## 2016-08-15 NOTE — Telephone Encounter (Signed)
Pt notified RX was sent in

## 2016-08-22 ENCOUNTER — Other Ambulatory Visit: Payer: Medicare Other

## 2016-08-22 DIAGNOSIS — E559 Vitamin D deficiency, unspecified: Secondary | ICD-10-CM

## 2016-08-22 DIAGNOSIS — I10 Essential (primary) hypertension: Secondary | ICD-10-CM

## 2016-08-22 DIAGNOSIS — E039 Hypothyroidism, unspecified: Secondary | ICD-10-CM

## 2016-08-22 DIAGNOSIS — M1990 Unspecified osteoarthritis, unspecified site: Secondary | ICD-10-CM

## 2016-08-23 LAB — CBC WITH DIFFERENTIAL/PLATELET
Basophils Absolute: 0 10*3/uL (ref 0.0–0.2)
Basos: 1 %
EOS (ABSOLUTE): 0.3 10*3/uL (ref 0.0–0.4)
Eos: 5 %
Hematocrit: 48.1 % — ABNORMAL HIGH (ref 34.0–46.6)
Hemoglobin: 16.1 g/dL — ABNORMAL HIGH (ref 11.1–15.9)
Immature Grans (Abs): 0 10*3/uL (ref 0.0–0.1)
Immature Granulocytes: 0 %
Lymphocytes Absolute: 1.9 10*3/uL (ref 0.7–3.1)
Lymphs: 30 %
MCH: 31.1 pg (ref 26.6–33.0)
MCHC: 33.5 g/dL (ref 31.5–35.7)
MCV: 93 fL (ref 79–97)
Monocytes Absolute: 0.4 10*3/uL (ref 0.1–0.9)
Monocytes: 6 %
Neutrophils Absolute: 3.7 10*3/uL (ref 1.4–7.0)
Neutrophils: 58 %
Platelets: 318 10*3/uL (ref 150–379)
RBC: 5.18 x10E6/uL (ref 3.77–5.28)
RDW: 14.2 % (ref 12.3–15.4)
WBC: 6.4 10*3/uL (ref 3.4–10.8)

## 2016-08-23 LAB — CMP14+EGFR
ALT: 19 IU/L (ref 0–32)
AST: 10 IU/L (ref 0–40)
Albumin/Globulin Ratio: 1.6 (ref 1.2–2.2)
Albumin: 4.1 g/dL (ref 3.5–4.8)
Alkaline Phosphatase: 84 IU/L (ref 39–117)
BUN/Creatinine Ratio: 16 (ref 12–28)
BUN: 12 mg/dL (ref 8–27)
Bilirubin Total: 0.3 mg/dL (ref 0.0–1.2)
CO2: 23 mmol/L (ref 18–29)
Calcium: 10 mg/dL (ref 8.7–10.3)
Chloride: 100 mmol/L (ref 96–106)
Creatinine, Ser: 0.75 mg/dL (ref 0.57–1.00)
GFR calc Af Amer: 88 mL/min/{1.73_m2} (ref >59)
GFR calc non Af Amer: 77 mL/min/{1.73_m2} (ref >59)
Globulin, Total: 2.5 g/dL (ref 1.5–4.5)
Glucose: 119 mg/dL — ABNORMAL HIGH (ref 65–99)
Potassium: 4.4 mmol/L (ref 3.5–5.2)
Sodium: 142 mmol/L (ref 134–144)
Total Protein: 6.6 g/dL (ref 6.0–8.5)

## 2016-08-23 LAB — TSH+FREE T4
Free T4: 1.56 ng/dL (ref 0.82–1.77)
TSH: 2.68 u[IU]/mL (ref 0.450–4.500)

## 2016-08-23 LAB — VITAMIN D 25 HYDROXY (VIT D DEFICIENCY, FRACTURES): Vit D, 25-Hydroxy: 26.4 ng/mL — ABNORMAL LOW (ref 30.0–100.0)

## 2016-08-25 ENCOUNTER — Telehealth: Payer: Self-pay | Admitting: Family Medicine

## 2016-08-25 NOTE — Telephone Encounter (Signed)
Pt aware lab results have not been reviewed by Dr. Darlyn ReadStacks yet

## 2016-08-26 ENCOUNTER — Encounter: Payer: Medicare Other | Admitting: Family Medicine

## 2016-08-29 NOTE — Progress Notes (Signed)
erroneous

## 2016-09-28 ENCOUNTER — Ambulatory Visit: Payer: Medicare Other | Admitting: Family Medicine

## 2016-10-05 ENCOUNTER — Encounter: Payer: Self-pay | Admitting: Family Medicine

## 2016-10-05 ENCOUNTER — Ambulatory Visit (INDEPENDENT_AMBULATORY_CARE_PROVIDER_SITE_OTHER): Payer: Medicare Other | Admitting: Family Medicine

## 2016-10-05 VITALS — BP 115/74 | HR 58 | Temp 97.0°F | Ht 65.0 in | Wt 214.0 lb

## 2016-10-05 DIAGNOSIS — E559 Vitamin D deficiency, unspecified: Secondary | ICD-10-CM

## 2016-10-05 DIAGNOSIS — F3342 Major depressive disorder, recurrent, in full remission: Secondary | ICD-10-CM

## 2016-10-05 DIAGNOSIS — I10 Essential (primary) hypertension: Secondary | ICD-10-CM

## 2016-10-05 DIAGNOSIS — E039 Hypothyroidism, unspecified: Secondary | ICD-10-CM

## 2016-10-05 DIAGNOSIS — D751 Secondary polycythemia: Secondary | ICD-10-CM | POA: Diagnosis not present

## 2016-10-05 DIAGNOSIS — M170 Bilateral primary osteoarthritis of knee: Secondary | ICD-10-CM

## 2016-10-05 NOTE — Progress Notes (Signed)
Subjective:  Patient ID: Erin Ewing, female    DOB: 04/14/1936  Age: 81 y.o. MRN: [id number]  CC: Hypertension (pt here today for routine follow up on HTN and to discuss her blood work from 1 month ago)   HPI Erin Ewing presents for  follow-up of hypertension. Patient has no history of headache chest pain or shortness of breath or recent cough. Patient also denies symptoms of TIA such as numbness weakness lateralizing. Patient checks  blood pressure at home and has not had any elevated readings recently. Patient denies side effects from medication. States taking it regularly.   Patient presents for follow-up on  thyroid. The patient has a history of hypothyroidism for many years. It has been stable recently. Pt. denies any change in  voice, loss of hair, heat or cold intolerance. Energy level has been adequate to good. Patient denies constipation and diarrhea. No myxedema. Medication is as noted below. Verified that pt is taking it daily on an empty stomach. Well tolerated.   History Erin Ewing has a past medical history of Arthritis; Hypertension; Obesity; Polycythemia; Reflex sympathetic dystrophy (08/16/2012); and Subclinical hypothyroidism.   Erin Ewing has no past surgical history on file.   Her family history includes Heart disease in her father.Erin Ewing reports that Erin Ewing has been smoking Cigarettes.  Erin Ewing has been smoking about 0.25 packs per day. Erin Ewing has never used smokeless tobacco. Erin Ewing reports that Erin Ewing does not drink alcohol or use drugs.  Current Outpatient Prescriptions on File Prior to Visit  Medication Sig Dispense Refill  . amLODipine (NORVASC) 5 MG tablet Take 1 tablet (5 mg total) by mouth daily. 90 tablet 3  . aspirin 81 MG tablet Take 81 mg by mouth daily.    . DULoxetine (CYMBALTA) 60 MG capsule Take 1 capsule (60 mg total) by mouth daily. 90 capsule 3  . levothyroxine (SYNTHROID, LEVOTHROID) 100 MCG tablet Take 1 tablet (100 mcg total) by mouth daily before breakfast. 90  tablet 4  . nabumetone (RELAFEN) 500 MG tablet Take 2 tablets (1,000 mg total) by mouth 2 (two) times daily. 360 tablet 3  . olmesartan-hydrochlorothiazide (BENICAR HCT) 40-12.5 MG tablet Take 1 tablet by mouth daily. 90 tablet 4   No current facility-administered medications on file prior to visit.     ROS Review of Systems  Constitutional: Negative for activity change, appetite change and fever.  HENT: Negative for congestion, rhinorrhea and sore throat.   Eyes: Negative for visual disturbance.  Respiratory: Negative for cough and shortness of breath.   Cardiovascular: Negative for chest pain and palpitations.  Gastrointestinal: Negative for abdominal pain, diarrhea and nausea.  Genitourinary: Negative for dysuria.  Musculoskeletal: Negative for arthralgias and myalgias.    Objective:  BP 115/74   Pulse (!) 58   Temp 97 F (36.1 C) (Oral)   Ht '5\' 5"'  (1.651 m)   Wt 214 lb (97.1 kg)   BMI 35.61 kg/m   BP Readings from Last 3 Encounters:  10/05/16 115/74  11/17/15 123/67  04/29/15 120/76    Wt Readings from Last 3 Encounters:  10/05/16 214 lb (97.1 kg)  11/17/15 214 lb (97.1 kg)  04/29/15 220 lb 3.2 oz (99.9 kg)     Physical Exam  Constitutional: Erin Ewing is oriented to person, place, and time. Erin Ewing appears well-developed and well-nourished. No distress.  HENT:  Head: Normocephalic and atraumatic.  Right Ear: External ear normal.  Left Ear: External ear normal.  Nose: Nose normal.  Mouth/Throat: Oropharynx is clear and  moist.  Eyes: Conjunctivae and EOM are normal. Pupils are equal, round, and reactive to light.  Neck: Normal range of motion. Neck supple. No thyromegaly present.  Cardiovascular: Normal rate, regular rhythm and normal heart sounds.   No murmur heard. Pulmonary/Chest: Effort normal and breath sounds normal. No respiratory distress. Erin Ewing has no wheezes. Erin Ewing has no rales.  Abdominal: Soft. Bowel sounds are normal. Erin Ewing exhibits no distension. There is no  tenderness.  Lymphadenopathy:    Erin Ewing has no cervical adenopathy.  Neurological: Erin Ewing is alert and oriented to person, place, and time. Erin Ewing has normal reflexes.  Skin: Skin is warm and dry.  Psychiatric: Erin Ewing has a normal mood and affect. Her behavior is normal. Judgment and thought content normal.     Lab Results  Component Value Date   WBC 6.4 08/22/2016   HGB 16.3 (A) 09/23/2014   HCT 48.1 (H) 08/22/2016   PLT 318 08/22/2016   GLUCOSE 119 (H) 08/22/2016   CHOL 193 11/17/2015   TRIG 165 (H) 11/17/2015   HDL 53 11/17/2015   LDLCALC 107 (H) 11/17/2015   ALT 19 08/22/2016   AST 10 08/22/2016   NA 142 08/22/2016   K 4.4 08/22/2016   CL 100 08/22/2016   CREATININE 0.75 08/22/2016   BUN 12 08/22/2016   CO2 23 08/22/2016   TSH 2.680 08/22/2016    Mr Lumbar Spine Wo Contrast  Result Date: 03/30/2015 CLINICAL DATA:  81 year old female with lumbar back pain radiating to the left hip for 4 months with no known injury. Initial encounter. Scoliosis. EXAM: MRI LUMBAR SPINE WITHOUT CONTRAST TECHNIQUE: Multiplanar, multisequence MR imaging of the lumbar spine was performed. No intravenous contrast was administered. COMPARISON:  [facility] CT Abdomen and Pelvis 05/05/2009. [place] lumbar radiographs 03/10/2015. FINDINGS: Chronic progressive dextro convex lumbar scoliosis is associated with widespread advanced disc, endplate, and posterior element degeneration in the lumbar spine. Normal lumbar segmentation. Degenerative appearing posterior lumbar endplate marrow edema from the L1 to the L4 level. No acute osseous abnormality identified. No signal abnormality in the visualized lower thoracic spinal cord. There does appear to be mild mass effect on the conus at L1-L2 due to spinal stenosis at that level, see below. Stable visualized abdominal viscera. T11-12: Anterior eccentric disc osteophyte complex with up to moderate facet hypertrophy. No spinal stenosis.  Moderate right and mild left T11 foraminal stenosis. T12-L1: Right eccentric circumferential disc bulge. Up to moderate facet and ligament flavum hypertrophy. Borderline to mild spinal stenosis. Moderate right T12 foraminal stenosis. L1-L2: Severe disc space loss. Bulky and lobulated circumferential disc osteophyte complex. Up to moderate facet and ligament flavum hypertrophy. Moderate spinal stenosis (series 3, image 9). Moderate right greater than left L1 foraminal stenosis. L2-L3: Severe disc space loss. Bulky and lobulated left eccentric circumferential disc osteophyte complex. Up to moderate facet and ligament flavum hypertrophy. Moderate spinal stenosis (series 6, image 23). Severe left and mild right L2 foraminal stenosis. L3-L4: Bulky circumferential disc osteophyte complex. Moderate to severe facet and ligament flavum hypertrophy. Moderate to severe spinal and left lateral recess stenosis. Moderate to severe bilateral L3 foraminal stenosis worse on the left. L4-L5: Right eccentric circumferential disc osteophyte complex. Severe right greater than left facet and ligament flavum hypertrophy. Mild to moderate spinal stenosis with severe right and moderate left lateral recess stenosis and L4 foraminal stenosis. L5-S1: Less pronounced circumferential disc osteophyte complex, eccentric to the right. Severe facet and ligament flavum hypertrophy. Facet joint fluid bilaterally. Posteriorly situated synovial  cysts greater on the left. Mild spinal and up to moderate right lateral recess stenosis. Moderate to severe bilateral L5 foraminal stenosis, greater on the left. IMPRESSION: 1. Widespread and severe lumbar spine degeneration in the setting of progressive dextro convex lumbar scoliosis. 2. Moderate spinal stenosis from L1-L2 to L4-L5, up to severe at some levels including at the left lateral recess of L3-L4, and right lateral recess of L4-L5. 3. Moderate or severe degenerative neural foraminal stenosis throughout  the lumbar spine and involving the right side lower thoracic foramina. Electronically Signed   By: Genevie Ann M.D.   On: 03/30/2015 17:21    Assessment & Plan:   Erin Ewing was seen today for hypertension.  Diagnoses and all orders for this visit:  Essential hypertension  Polycythemia, secondary  Arthritis of both knees  Vitamin D deficiency  Hypothyroidism, unspecified type  Recurrent major depressive disorder, in full remission ([place])   I am having Erin Ewing maintain her aspirin, amLODipine, DULoxetine, levothyroxine, olmesartan-hydrochlorothiazide, nabumetone, and (Diphenhydramine-APAP, sleep, (TYLENOL PM EXTRA STRENGTH PO)).  Meds ordered this encounter  Medications  . Diphenhydramine-APAP, sleep, (TYLENOL PM EXTRA STRENGTH PO)    Sig: Take by mouth.   Results for orders placed or performed in visit on 08/22/16  CBC with Differential/Platelet  Result Value Ref Range   WBC 6.4 3.4 - 10.8 x10E3/uL   RBC 5.18 3.77 - 5.28 x10E6/uL   Hemoglobin 16.1 (H) 11.1 - 15.9 g/dL   Hematocrit 48.1 (H) 34.0 - 46.6 %   MCV 93 79 - 97 fL   MCH 31.1 26.6 - 33.0 pg   MCHC 33.5 31.5 - 35.7 g/dL   RDW 14.2 12.3 - 15.4 %   Platelets 318 150 - 379 x10E3/uL   Neutrophils 58 Not Estab. %   Lymphs 30 Not Estab. %   Monocytes 6 Not Estab. %   Eos 5 Not Estab. %   Basos 1 Not Estab. %   Neutrophils Absolute 3.7 1.4 - 7.0 x10E3/uL   Lymphocytes Absolute 1.9 0.7 - 3.1 x10E3/uL   Monocytes Absolute 0.4 0.1 - 0.9 x10E3/uL   EOS (ABSOLUTE) 0.3 0.0 - 0.4 x10E3/uL   Basophils Absolute 0.0 0.0 - 0.2 x10E3/uL   Immature Granulocytes 0 Not Estab. %   Immature Grans (Abs) 0.0 0.0 - 0.1 x10E3/uL  CMP14+EGFR  Result Value Ref Range   Glucose 119 (H) 65 - 99 mg/dL   BUN 12 8 - 27 mg/dL   Creatinine, Ser 0.75 0.57 - 1.00 mg/dL   GFR calc non Af Amer 77 >59 mL/min/1.73   GFR calc Af Amer 88 >59 mL/min/1.73   BUN/Creatinine Ratio 16 12 - 28   Sodium 142 134 - 144 mmol/L   Potassium 4.4 3.5 - 5.2 mmol/L    Chloride 100 96 - 106 mmol/L   CO2 23 18 - 29 mmol/L   Calcium 10.0 8.7 - 10.3 mg/dL   Total Protein 6.6 6.0 - 8.5 g/dL   Albumin 4.1 3.5 - 4.8 g/dL   Globulin, Total 2.5 1.5 - 4.5 g/dL   Albumin/Globulin Ratio 1.6 1.2 - 2.2   Bilirubin Total 0.3 0.0 - 1.2 mg/dL   Alkaline Phosphatase 84 39 - 117 IU/L   AST 10 0 - 40 IU/L   ALT 19 0 - 32 IU/L  TSH + free T4  Result Value Ref Range   TSH 2.680 0.450 - 4.500 uIU/mL   Free T4 1.56 0.82 - 1.77 ng/dL  VITAMIN D 25 Hydroxy (Vit-D Deficiency, Fractures)  Result Value Ref Range   Vit D, 25-Hydroxy 26.4 (L) 30.0 - 100.0 ng/mL   Lab results reviewed with patient. We discussed vitamin D supplementation through her diet and sun exposure since Erin Ewing cannot tolerate the fats that the capsules are dissolved in.  Follow-up: Return in about 6 months (around 04/07/2017).  Claretta Fraise, M.D.

## 2017-04-07 ENCOUNTER — Encounter: Payer: Medicare Other | Admitting: Family Medicine

## 2017-04-21 ENCOUNTER — Telehealth: Payer: Self-pay | Admitting: Surgery

## 2017-04-21 NOTE — Telephone Encounter (Signed)
Sched appt 04/24/17 at 3:15. Pt's #not going through, lm on emergency contact's #.

## 2017-04-21 NOTE — Telephone Encounter (Signed)
-----   Message from Dannielle Karvonen, [place] sent at 04/20/2017  9:21 AM EDT ----- Please schedule this pt with [facility] per Dr. Ulice Brilliant, blue L great toe, ischemia vs micro emboli, labs done at [facility]

## 2017-04-24 ENCOUNTER — Encounter: Payer: Self-pay | Admitting: Surgery

## 2017-04-24 ENCOUNTER — Ambulatory Visit (INDEPENDENT_AMBULATORY_CARE_PROVIDER_SITE_OTHER): Payer: Medicare Other | Admitting: Surgery

## 2017-04-24 ENCOUNTER — Ambulatory Visit: Payer: Medicare Other | Admitting: Surgery

## 2017-04-24 VITALS — BP 139/88 | HR 66 | Temp 97.9°F | Resp 20 | Ht 65.0 in | Wt 220.0 lb

## 2017-04-24 DIAGNOSIS — I871 Compression of vein: Secondary | ICD-10-CM

## 2017-04-24 DIAGNOSIS — I739 Peripheral vascular disease, unspecified: Secondary | ICD-10-CM | POA: Diagnosis not present

## 2017-04-24 DIAGNOSIS — I75022 Atheroembolism of left lower extremity: Secondary | ICD-10-CM | POA: Diagnosis not present

## 2017-04-24 MED ORDER — CLOPIDOGREL BISULFATE 75 MG PO TABS: 75.0000 mg | ORAL_TABLET | Freq: Every day | ORAL | 6 refills | Status: DC

## 2017-04-24 NOTE — Progress Notes (Signed)
History of Present Illness:  Patient is a 81 y.o. year old female who presents for evaluation of left great toe discoloration that appeared 2 weeks ago after wearing tight fitting shoes.    She has not had symptoms of claudication and states she is very active.  She has quit smoking in the past week since she has discovered that she has PAD.   There no rest pain.  There is no history of ulcerations on the feet.  Atherosclerotic risk factors and other medical problems include Tobacco abuse until one week ago, HTN managed with Norvasc, she denise DM and CAD.    She takes a daily aspirin No anticoagulation medication other wise  Past Medical History:  Diagnosis Date  . Arthritis    kneew  . Hypertension   . Obesity   . 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 Reflex sympathetic dystrophy 08/16/2012  . Subclinical hypothyroidism     History reviewed. No pertinent surgical history.  ROS:   General:  No weight loss, Fever, chills  HEENT: No recent headaches, no nasal bleeding, no visual changes, no sore throat  Neurologic: No dizziness, blackouts, seizures. No recent symptoms of stroke or mini- stroke. No recent episodes of slurred speech, or temporary blindness.  Cardiac: No recent episodes of chest pain/pressure, no shortness of breath at rest.  No shortness of breath with exertion.  Denies history of atrial fibrillation or irregular heartbeat  Vascular: No history of rest pain in feet.  No history of claudication.  No history of non-healing ulcer, No history of DVT   Pulmonary: No home oxygen, no productive cough, no hemoptysis,  No asthma or wheezing  Musculoskeletal:  [x ] Arthritis,  Low back pain,  [x ] Joint pain B knees  Hematologic:No history of hypercoagulable state.  No history of easy bleeding.  No history of anemia  Gastrointestinal: No hematochezia or melena,  No gastroesophageal reflux, no trouble swallowing  Urinary:  chronic Kidney disease,  on  HD -  MWF or  TTHS,  Burning with urination,  Frequent urination,  Difficulty urinating;   Skin: No rashes  Psychological: No history of anxiety,  No history of depression  Social History Social History  Substance Use Topics  . Smoking status: Current Every Day Smoker    Packs/day: 0.50    Types: Cigarettes  . Smokeless tobacco: Never Used  . Alcohol use No    Family History Family History  Problem Relation Age of Onset  . Heart disease Father     Allergies  Allergies  Allergen Reactions  . Peanuts [Peanut Oil] Anaphylaxis  . Celebrex [Celecoxib] Diarrhea  . Diclofenac Diarrhea  . Fish Allergy      Current Outpatient Prescriptions  Medication Sig Dispense Refill  . amLODipine (NORVASC) 5 MG tablet Take 1 tablet (5 mg total) by mouth daily. 90 tablet 3  . aspirin 81 MG tablet Take 81 mg by mouth daily.    . calcium carbonate (CALCIUM 600 HIGH POTENCY) 600 MG TABS tablet Take 600 mg by mouth 2 (two) times daily with a meal.    . Diphenhydramine-APAP, sleep, (TYLENOL PM EXTRA STRENGTH PO) Take by mouth.    . DULoxetine (CYMBALTA) 60 MG capsule Take 1 capsule (60 mg total) by mouth daily. 90 capsule 3  . levothyroxine (SYNTHROID, LEVOTHROID) 100 MCG tablet Take 1 tablet (100 mcg total) by mouth daily before breakfast. 90 tablet 4  .  nabumetone (RELAFEN) 500 MG tablet Take 2 tablets (1,000 mg total) by mouth 2 (two) times daily. 360 tablet 3  . olmesartan-hydrochlorothiazide (BENICAR HCT) 40-12.5 MG tablet Take 1 tablet by mouth daily. 90 tablet 4   No current facility-administered medications for this visit.     Physical Examination  Vitals:   04/24/17 1500  BP: 139/88  Pulse: 66  Resp: 20  Temp: 97.9 F (36.6 C)  TempSrc: Oral  SpO2: 91%  Weight: 220 lb (99.8 kg)  Height:  (1.651 m)    Body mass index is 36.61 kg/m.  General:  Alert and oriented, no acute distress HEENT: Normal Neck: No bruit or JVD Pulmonary: Clear to auscultation  bilaterally Cardiac: Regular Rate and Rhythm without murmur Abdomen: Soft, non-tender, non-distended, no mass, no scars Skin: No rash, left great toe blue/purple discoloration.  No open wounds, onychomycosis of the nails. Extremity Pulses:  2+ radial, brachial, femoral, dorsalis pedis, non palpable posterior tibial pulses bilaterally Musculoskeletal: No deformity or edema  Neurologic: Upper and lower extremity motor 5/5 and symmetric  DATA:  ABI Right  Left   ASSESSMENT:  PAD left greater ischemic changes without rest pain or symptoms of claudication. No history of non healing ulcer   PLAN:     Mosetta Pigeon PA-C [facility] [place] Office: [phone] Pager: [phone]  The patient was seen in conjunction with Dr. Myra Gianotti

## 2017-04-24 NOTE — Progress Notes (Signed)
[facility] [place]  Patient name: Erin Ewing MRN: [id number] DOB: [birth year] Sex: female   REQUESTING PROVIDER:    Dr. Ulice Brilliant   REASON FOR CONSULT:    Left blue toe  History of Present Illness:  Patient is a 81 y.o. year old female who presents for evaluation of left great toe discoloration that appeared 2 weeks ago after wearing tight fitting shoes.    She has not had symptoms of claudication and states she is very active.  She has quit smoking in the past week since she has discovered that she has PAD.   There no rest pain.  There is no history of ulcerations on the feet.  Atherosclerotic risk factors and other medical problems include Tobacco abuse until one week ago, HTN managed with Norvasc, she denise DM and CAD.    She takes a daily aspirin No anticoagulation medication other wise  Past Medical History:  Diagnosis Date  . Arthritis    kneew  . Hypertension   . Obesity   . 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 Reflex sympathetic dystrophy 08/16/2012  . Subclinical hypothyroidism     History reviewed. No pertinent surgical history.  ROS:   General:  No weight loss, Fever, chills  HEENT: No recent headaches, no nasal bleeding, no visual changes, no sore throat  Neurologic: No dizziness, blackouts, seizures. No recent symptoms of stroke or mini- stroke. No recent episodes of slurred speech, or temporary blindness.  Cardiac: No recent episodes of chest pain/pressure, no shortness of breath at rest.  No shortness of breath with exertion.  Denies history of atrial fibrillation or irregular heartbeat  Vascular: No history of rest pain in feet.  No history of claudication.  No history of non-healing ulcer, No history of DVT   Pulmonary: No home oxygen, no productive cough, no hemoptysis,  No asthma or wheezing  Musculoskeletal:  [x ] Arthritis,  Low back pain,  [x ] Joint pain B  knees  Hematologic:No history of hypercoagulable state.  No history of easy bleeding.  No history of anemia  Gastrointestinal: No hematochezia or melena,  No gastroesophageal reflux, no trouble swallowing  Urinary:  chronic Kidney disease,  on HD -  MWF or  TTHS,  Burning with urination,  Frequent urination,  Difficulty urinating;   Skin: No rashes  Psychological: No history of anxiety,  No history of depression  Social History Social History  Substance Use Topics  . Smoking status: Current Every Day Smoker    Packs/day: 0.50    Types: Cigarettes  . Smokeless tobacco: Never Used  . Alcohol use No    Family History Family History  Problem Relation Age of Onset  . Heart disease Father     Allergies  Allergies  Allergen Reactions  . Peanuts [Peanut Oil] Anaphylaxis  . Celebrex [Celecoxib] Diarrhea  . Diclofenac Diarrhea  . Fish Allergy      Current Outpatient Prescriptions  Medication Sig Dispense Refill  . amLODipine (NORVASC) 5 MG tablet Take 1 tablet (5 mg total) by mouth daily. 90 tablet 3  . aspirin 81 MG tablet Take 81 mg by mouth daily.    . calcium carbonate (CALCIUM 600 HIGH POTENCY) 600 MG TABS tablet Take 600 mg by mouth 2 (two) times daily with a meal.    . Diphenhydramine-APAP, sleep, (TYLENOL PM EXTRA STRENGTH PO) Take by mouth.    . DULoxetine (CYMBALTA)  60 MG capsule Take 1 capsule (60 mg total) by mouth daily. 90 capsule 3  . levothyroxine (SYNTHROID, LEVOTHROID) 100 MCG tablet Take 1 tablet (100 mcg total) by mouth daily before breakfast. 90 tablet 4  . nabumetone (RELAFEN) 500 MG tablet Take 2 tablets (1,000 mg total) by mouth 2 (two) times daily. 360 tablet 3  . olmesartan-hydrochlorothiazide (BENICAR HCT) 40-12.5 MG tablet Take 1 tablet by mouth daily. 90 tablet 4   No current facility-administered medications for this visit.     Physical Examination  Vitals:   04/24/17 1500  BP: 139/88  Pulse: 66  Resp: 20  Temp: 97.9  F (36.6 C)  TempSrc: Oral  SpO2: 91%  Weight: 220 lb (99.8 kg)  Height:  (1.651 m)    Body mass index is 36.61 kg/m.  General:  Alert and oriented, no acute distress HEENT: Normal Neck: No bruit or JVD Pulmonary: Clear to auscultation bilaterally Cardiac: Regular Rate and Rhythm without murmur Abdomen: Soft, non-tender, non-distended, no mass, no scars Skin: No rash, left great toe blue/purple discoloration.  No open wounds, onychomycosis of the nails. Extremity Pulses:  2+ radial, brachial, femoral, dorsalis pedis, non palpable posterior tibial pulses bilaterally Musculoskeletal: No deformity or edema  Neurologic: Upper and lower extremity motor 5/5 and symmetric  DATA:  ABI the right is 0.94.  The left is 0.74.  Toe pressures on the left are decreased ranging from 53-83.  On the right they are 83-157    ASSESSMENT:  Presumed left blue toe  PLAN:  I discussed with the patient that we would need to search for a etiology.  I think the most appropriate next step is to get a CT Carlyon Prows of the chest abdomen and pelvis with bilateral runoff to look for any stenotic areas or aneurysmal change.  In the meantime, I am adding Plavix to the patient's regimen.  She is also going to discuss with her primary care physician, the addition of a statin to treat cholesterol.  I have her scheduled to follow-up to see me in 2 weeks.   Durene Cal, MD [facility] [phone] Pager [phone]

## 2017-04-25 ENCOUNTER — Telehealth: Payer: Self-pay | Admitting: *Deleted

## 2017-04-25 NOTE — Addendum Note (Signed)
Addended by: Burton Apley A on: 04/25/2017 09:27 AM   Modules accepted: Orders

## 2017-04-25 NOTE — Telephone Encounter (Signed)
Patient called with medication question: started on Plavix and instructed to continue Aspirin.

## 2017-04-26 ENCOUNTER — Other Ambulatory Visit: Payer: Medicare Other

## 2017-04-26 ENCOUNTER — Telehealth: Payer: Self-pay | Admitting: Surgery

## 2017-04-26 DIAGNOSIS — I10 Essential (primary) hypertension: Secondary | ICD-10-CM

## 2017-04-26 DIAGNOSIS — E559 Vitamin D deficiency, unspecified: Secondary | ICD-10-CM

## 2017-04-26 DIAGNOSIS — E039 Hypothyroidism, unspecified: Secondary | ICD-10-CM

## 2017-04-26 DIAGNOSIS — I739 Peripheral vascular disease, unspecified: Secondary | ICD-10-CM

## 2017-04-26 NOTE — Telephone Encounter (Signed)
Per instructions from Dr.Brabham, I scheduled a CTA abd/pelvis w/ runoff and a CTA chest for this patient on 05/15/17 at 11am at the [facility]. She is to arrive at 10:30am with no solid foods 4 hours prior but liquids and medications are fine. She is also scheduled to see [facility] on that same day at 1:15pm. I left a detailed VM regarding this information and mailed a letter also.awt

## 2017-04-27 LAB — T4, FREE: Free T4: 1.42 ng/dL (ref 0.82–1.77)

## 2017-04-27 LAB — CMP14+EGFR
ALT: 14 IU/L (ref 0–32)
AST: 11 IU/L (ref 0–40)
Albumin/Globulin Ratio: 2 (ref 1.2–2.2)
Albumin: 4.5 g/dL (ref 3.5–4.8)
Alkaline Phosphatase: 98 IU/L (ref 39–117)
BUN/Creatinine Ratio: 19 (ref 12–28)
BUN: 15 mg/dL (ref 8–27)
Bilirubin Total: 0.3 mg/dL (ref 0.0–1.2)
CO2: 23 mmol/L (ref 20–29)
Calcium: 10.1 mg/dL (ref 8.7–10.3)
Chloride: 95 mmol/L — ABNORMAL LOW (ref 96–106)
Creatinine, Ser: 0.78 mg/dL (ref 0.57–1.00)
GFR calc Af Amer: 84 mL/min/{1.73_m2} (ref >59)
GFR calc non Af Amer: 73 mL/min/{1.73_m2} (ref >59)
Globulin, Total: 2.3 g/dL (ref 1.5–4.5)
Glucose: 113 mg/dL — ABNORMAL HIGH (ref 65–99)
Potassium: 4.7 mmol/L (ref 3.5–5.2)
Sodium: 134 mmol/L (ref 134–144)
Total Protein: 6.8 g/dL (ref 6.0–8.5)

## 2017-04-27 LAB — LIPID PANEL
Chol/HDL Ratio: 4 ratio (ref 0.0–4.4)
Cholesterol, Total: 209 mg/dL — ABNORMAL HIGH (ref 100–199)
HDL: 52 mg/dL (ref >39)
LDL Calculated: 110 mg/dL — ABNORMAL HIGH (ref 0–99)
Triglycerides: 234 mg/dL — ABNORMAL HIGH (ref 0–149)
VLDL Cholesterol Cal: 47 mg/dL — ABNORMAL HIGH (ref 5–40)

## 2017-04-27 LAB — TSH: TSH: 4.74 u[IU]/mL — ABNORMAL HIGH (ref 0.450–4.500)

## 2017-04-27 LAB — CBC WITH DIFFERENTIAL/PLATELET
Basophils Absolute: 0 10*3/uL (ref 0.0–0.2)
Basos: 0 %
EOS (ABSOLUTE): 0.3 10*3/uL (ref 0.0–0.4)
Eos: 4 %
Hematocrit: 47.5 % — ABNORMAL HIGH (ref 34.0–46.6)
Hemoglobin: 15.8 g/dL (ref 11.1–15.9)
Immature Grans (Abs): 0 10*3/uL (ref 0.0–0.1)
Immature Granulocytes: 0 %
Lymphocytes Absolute: 2.2 10*3/uL (ref 0.7–3.1)
Lymphs: 28 %
MCH: 30.7 pg (ref 26.6–33.0)
MCHC: 33.3 g/dL (ref 31.5–35.7)
MCV: 92 fL (ref 79–97)
Monocytes Absolute: 0.4 10*3/uL (ref 0.1–0.9)
Monocytes: 5 %
Neutrophils Absolute: 4.9 10*3/uL (ref 1.4–7.0)
Neutrophils: 63 %
Platelets: 294 10*3/uL (ref 150–379)
RBC: 5.14 x10E6/uL (ref 3.77–5.28)
RDW: 14.1 % (ref 12.3–15.4)
WBC: 7.8 10*3/uL (ref 3.4–10.8)

## 2017-04-27 LAB — VITAMIN D 25 HYDROXY (VIT D DEFICIENCY, FRACTURES): Vit D, 25-Hydroxy: 23.3 ng/mL — ABNORMAL LOW (ref 30.0–100.0)

## 2017-04-28 ENCOUNTER — Ambulatory Visit (INDEPENDENT_AMBULATORY_CARE_PROVIDER_SITE_OTHER): Payer: Medicare Other | Admitting: Family Medicine

## 2017-04-28 ENCOUNTER — Encounter: Payer: Self-pay | Admitting: Family Medicine

## 2017-04-28 VITALS — BP 120/61 | HR 69 | Temp 97.3°F | Ht 65.0 in | Wt 224.0 lb

## 2017-04-28 DIAGNOSIS — F17200 Nicotine dependence, unspecified, uncomplicated: Secondary | ICD-10-CM

## 2017-04-28 DIAGNOSIS — E039 Hypothyroidism, unspecified: Secondary | ICD-10-CM | POA: Diagnosis not present

## 2017-04-28 DIAGNOSIS — Z23 Encounter for immunization: Secondary | ICD-10-CM | POA: Diagnosis not present

## 2017-04-28 DIAGNOSIS — Z6837 Body mass index (BMI) 37.0-37.9, adult: Secondary | ICD-10-CM | POA: Diagnosis not present

## 2017-04-28 DIAGNOSIS — I739 Peripheral vascular disease, unspecified: Secondary | ICD-10-CM | POA: Diagnosis not present

## 2017-04-28 DIAGNOSIS — I10 Essential (primary) hypertension: Secondary | ICD-10-CM | POA: Diagnosis not present

## 2017-04-28 NOTE — Progress Notes (Signed)
Subjective:  Patient ID: Erin Ewing, female    DOB: 1935/10/31  Age: 81 y.o. MRN: [id number]  CC: Hypertension (pt here today for routine follow up of her HTN, discuss test results and discuss her appt with the specialist.)   HPI Erin Ewing presents for  follow-up of hypertension. Patient has no history of headache chest pain or shortness of breath or recent cough. Patient also denies symptoms of TIA such as numbness weakness lateralizing. Patient checks  blood pressure at home and has not had any elevated readings recently. Patient denies side effects from his medication. States taking it regularly. Patient is in for advice regarding her toe and the program recommended by Dr. Myra Gianotti of vein and vascular. She is a bit concerned about having to do a CT scan since the MRI was so confining. However she is very concerned since her mother lost a limb due to ischemia that she is willing to go forward. She wants to know what to do in the meantime. She tells. She's just been told to take very good care of her feet and not allow them to be injured. She quit smoking recently when she got the news about her circulation to her legs.  Patient presents for follow-up on  thyroid. The patient has a history of hypothyroidism for many years. It has been stable recently. Pt. denies any change in  voice, loss of hair, heat or cold intolerance. Energy level has been adequate to good. Patient denies constipation and diarrhea. No myxedema. Medication is as noted below. Verified that pt is taking it daily on an empty stomach. Well tolerated.   Depression screen [facility] 2/9 04/28/2017 10/05/2016 11/17/2015  Decreased Interest 0 0 0  Down, Depressed, Hopeless 0 0 0  PHQ - 2 Score 0 0 0  Altered sleeping - - -  Tired, decreased energy - - -  Change in appetite - - -  Feeling bad or failure about yourself  - - -  Trouble concentrating - - -  Moving slowly or fidgety/restless - - -  Suicidal thoughts - - -  PHQ-9  Score - - -    History Aritzel has a past medical history of Arthritis; Hypertension; Obesity; Polycythemia; Reflex sympathetic dystrophy (08/16/2012); and Subclinical hypothyroidism.   She has no past surgical history on file.   Her family history includes Heart disease in her father.She reports that she quit smoking about 3 weeks ago. Her smoking use included Cigarettes. She smoked 0.50 packs per day. She has never used smokeless tobacco. She reports that she does not drink alcohol or use drugs.    ROS Review of Systems  Constitutional: Negative for activity change, appetite change and fever.  HENT: Negative for congestion, rhinorrhea and sore throat.   Eyes: Negative for visual disturbance.  Respiratory: Negative for cough and shortness of breath.   Cardiovascular: Negative for chest pain and palpitations.  Gastrointestinal: Negative for abdominal pain, diarrhea and nausea.  Genitourinary: Negative for dysuria.  Musculoskeletal: Negative for arthralgias and myalgias.  Skin: Positive for color change (he left first toe is blue).  Neurological: Negative.   Hematological: Negative.   Psychiatric/Behavioral: Negative.     Objective:  BP 120/61   Pulse 69   Temp (!) 97.3 F (36.3 C) (Oral)   Ht 5\' 5"  (1.651 m)   Wt 224 lb (101.6 kg)   BMI 37.28 kg/m   BP Readings from Last 3 Encounters:  04/28/17 120/61  04/24/17 139/88  10/05/16 115/74  Wt Readings from Last 3 Encounters:  04/28/17 224 lb (101.6 kg)  04/24/17 220 lb (99.8 kg)  10/05/16 214 lb (97.1 kg)     Physical Exam  Constitutional: She is oriented to person, place, and time. She appears well-developed and well-nourished. No distress.  HENT:  Head: Normocephalic and atraumatic.  Right Ear: External ear normal.  Left Ear: External ear normal.  Nose: Nose normal.  Mouth/Throat: Oropharynx is clear and moist.  Eyes: Pupils are equal, round, and reactive to light. Conjunctivae and EOM are normal.  Neck:  Normal range of motion. Neck supple. No thyromegaly present.  Cardiovascular: Normal rate, regular rhythm and normal heart sounds.   No murmur heard. Pulmonary/Chest: Effort normal and breath sounds normal. No respiratory distress. She has no wheezes. She has no rales.  Abdominal: Soft. Bowel sounds are normal. She exhibits no distension. There is no tenderness.  Lymphadenopathy:    She has no cervical adenopathy.  Neurological: She is alert and oriented to person, place, and time. She has normal reflexes.  Skin: Skin is warm and dry. There is pallor (push discoloration of the left first toe noted).  Psychiatric: She has a normal mood and affect. Her behavior is normal. Judgment and thought content normal.      Assessment & Plan:   Erin Ewing was seen today for hypertension.  Diagnoses and all orders for this visit:  Essential hypertension  Hypothyroidism, unspecified type  Tobacco use disorder  Class 2 severe obesity due to excess calories with serious comorbidity and body mass index (BMI) of 37.0 to 37.9 in adult [facility])  Peripheral arterial disease (HCC)   Workup underway for asymptomatic claudication. Dr. problems ordered a CT runoff in order to determine next steps. That's to be done on November 5. Patient was advised to wear supportive, protective shoes in the meantime. Since she is asymptomatic I would advise her not to do anything overly strenuous with regard to cardio activity until the problems better defined by the CT scan.  Labs reviewed, A1c to be added to her panel due to elevated glucose. She tells me she was fasting when it was drawn 2 days ago.    I am having Ms. Statz maintain her aspirin, amLODipine, DULoxetine, levothyroxine, olmesartan-hydrochlorothiazide, nabumetone, (Diphenhydramine-APAP, sleep, (TYLENOL PM EXTRA STRENGTH PO)), calcium carbonate, and clopidogrel.  Allergies as of 04/28/2017      Reactions   Peanuts [peanut Oil] Anaphylaxis   Celebrex  [celecoxib] Diarrhea   Diclofenac Diarrhea   Fish Allergy       Medication List       Accurate as of 04/28/17  3:39 PM. Always use your most recent med list.          amLODipine 5 MG tablet Commonly known as:  NORVASC Take 1 tablet (5 mg total) by mouth daily.   aspirin 81 MG tablet Take 81 mg by mouth daily.   CALCIUM 600 HIGH POTENCY 600 MG Tabs tablet Generic drug:  calcium carbonate Take 600 mg by mouth 2 (two) times daily with a meal.   clopidogrel 75 MG tablet Commonly known as:  PLAVIX Take 1 tablet (75 mg total) by mouth daily.   DULoxetine 60 MG capsule Commonly known as:  CYMBALTA Take 1 capsule (60 mg total) by mouth daily.   levothyroxine 100 MCG tablet Commonly known as:  SYNTHROID, LEVOTHROID Take 1 tablet (100 mcg total) by mouth daily before breakfast.   nabumetone 500 MG tablet Commonly known as:  RELAFEN Take 2 tablets (1,000  mg total) by mouth 2 (two) times daily.   olmesartan-hydrochlorothiazide 40-12.5 MG tablet Commonly known as:  BENICAR HCT Take 1 tablet by mouth daily.   TYLENOL PM EXTRA STRENGTH PO Take by mouth.        Follow-up: Return in about 6 months (around 10/27/2017), or if symptoms worsen or fail to improve, for She will be primarily under the care of Dr.Brabham for this is illness.  Mechele ClaudeWarren Kassadi Presswood, M.D.

## 2017-04-30 LAB — SPECIMEN STATUS REPORT

## 2017-04-30 LAB — HGB A1C W/O EAG: Hgb A1c MFr Bld: 5.7 % — ABNORMAL HIGH (ref 4.8–5.6)

## 2017-05-01 ENCOUNTER — Other Ambulatory Visit: Payer: Self-pay | Admitting: Family Medicine

## 2017-05-01 MED ORDER — VITAMIN D (ERGOCALCIFEROL) 1.25 MG (50000 UNIT) PO CAPS: 50000.0000 [IU] | ORAL_CAPSULE | ORAL | 3 refills | Status: DC

## 2017-05-15 ENCOUNTER — Encounter: Payer: Self-pay | Admitting: Surgery

## 2017-05-15 ENCOUNTER — Ambulatory Visit
Admission: RE | Admit: 2017-05-15 | Discharge: 2017-05-15 | Disposition: A | Discharge status: Home / Self Care | Payer: Medicare Other | Source: Ambulatory Visit | Attending: Surgery | Admitting: Surgery

## 2017-05-15 ENCOUNTER — Ambulatory Visit: Payer: Medicare Other | Admitting: Surgery

## 2017-05-15 ENCOUNTER — Other Ambulatory Visit: Payer: Self-pay | Admitting: *Deleted

## 2017-05-15 ENCOUNTER — Encounter: Payer: Self-pay | Admitting: *Deleted

## 2017-05-15 VITALS — BP 128/90 | HR 95 | Temp 98.5°F | Resp 20 | Ht 65.0 in | Wt 226.0 lb

## 2017-05-15 DIAGNOSIS — I739 Peripheral vascular disease, unspecified: Secondary | ICD-10-CM

## 2017-05-15 DIAGNOSIS — I75022 Atheroembolism of left lower extremity: Secondary | ICD-10-CM

## 2017-05-15 DIAGNOSIS — I871 Compression of vein: Secondary | ICD-10-CM

## 2017-05-15 MED ORDER — IOPAMIDOL (ISOVUE-370) INJECTION 76%
150.0000 mL | Freq: Once | INTRAVENOUS | Status: AC | PRN
  Administered 2017-05-15: 150 mL via INTRAVENOUS

## 2017-05-15 NOTE — H&P (View-Only) (Signed)
[facility] [place]  Patient name: Erin Ewing MRN: [id number] DOB: 02-24-1936 Sex: female   REASON FOR VISIT:    Follow up  HISOTRY OF PRESENT ILLNESS:    This is a 81 year old female who presented in October 2018 with a 2 week history of left great toe discoloration.  She attributes this to wearing tight fitting shoes.  She has had no other symptoms of claudication in the past.  She remains very active.  She does have a history of tobacco abuse.  She is medically managed for hypertension.  She is on a daily aspirin.  I scheduled her for a CT angiogram for better evaluation.  She is here today to discuss these results.  She denies any interval change   PAST MEDICAL HISTORY:   Past Medical History:  Diagnosis Date  . Arthritis    kneew  . Hypertension   . Obesity   . 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 Reflex sympathetic dystrophy 08/16/2012  . Subclinical hypothyroidism      FAMILY HISTORY:   Family History  Problem Relation Age of Onset  . Heart disease Father     SOCIAL HISTORY:   Social History   Tobacco Use  . Smoking status: Former Smoker    Packs/day: 0.50    Types: Cigarettes    Last attempt to quit: 04/07/2017    Years since quitting: 0.1  . Smokeless tobacco: Never Used  Substance Use Topics  . Alcohol use: No     ALLERGIES:   Allergies  Allergen Reactions  . Peanuts [Peanut Oil] Anaphylaxis  . Celebrex [Celecoxib] Diarrhea  . Diclofenac Diarrhea  . Fish Allergy      CURRENT MEDICATIONS:   Current Outpatient Medications  Medication Sig Dispense Refill  . amLODipine (NORVASC) 5 MG tablet Take 1 tablet (5 mg total) by mouth daily. 90 tablet 3  . aspirin 81 MG tablet Take 81 mg by mouth daily.    . calcium carbonate (CALCIUM 600 HIGH POTENCY) 600 MG TABS tablet Take 600 mg by mouth 2 (two) times daily with a meal.    . clopidogrel (PLAVIX) 75 MG tablet Take 1 tablet  (75 mg total) by mouth daily. 30 tablet 6  . Diphenhydramine-APAP, sleep, (TYLENOL PM EXTRA STRENGTH PO) Take by mouth.    . DULoxetine (CYMBALTA) 60 MG capsule Take 1 capsule (60 mg total) by mouth daily. 90 capsule 3  . levothyroxine (SYNTHROID, LEVOTHROID) 100 MCG tablet Take 1 tablet (100 mcg total) by mouth daily before breakfast. 90 tablet 4  . nabumetone (RELAFEN) 500 MG tablet Take 2 tablets (1,000 mg total) by mouth 2 (two) times daily. 360 tablet 3  . olmesartan-hydrochlorothiazide (BENICAR HCT) 40-12.5 MG tablet Take 1 tablet by mouth daily. 90 tablet 4  . Vitamin D, Ergocalciferol, (DRISDOL) 50000 units CAPS capsule Take 1 capsule (50,000 Units total) by mouth every 7 (seven) days. 13 capsule 3   No current facility-administered medications for this visit.     REVIEW OF SYSTEMS:   [X]  denotes positive finding, [ ]  denotes negative finding Cardiac  Comments:  Chest pain or chest pressure:    Shortness of breath upon exertion:    Short of breath when lying flat:    Irregular heart rhythm:        Vascular    Pain in calf, thigh, or hip brought on by ambulation:    Pain in feet at night that wakes you up from  your sleep:     Blood clot in your veins:    Leg swelling:         Pulmonary    Oxygen at home:    Productive cough:     Wheezing:         Neurologic    Sudden weakness in arms or legs:     Sudden numbness in arms or legs:     Sudden onset of difficulty speaking or slurred speech:    Temporary loss of vision in one eye:     Problems with dizziness:         Gastrointestinal    Blood in stool:     Vomited blood:         Genitourinary    Burning when urinating:     Blood in urine:        Psychiatric    Major depression:         Hematologic    Bleeding problems:    Problems with blood clotting too easily:        Skin    Rashes or ulcers:        Constitutional    Fever or chills:      PHYSICAL EXAM:   There were no vitals filed for this  visit.  GENERAL: The patient is a well-nourished female, in no acute distress. The vital signs are documented above. CARDIAC: There is a regular rate and rhythm.  VASCULAR: Pedal pulses are nonpalpable PULMONARY: Non-labored respirations MUSCULOSKELETAL: There are no major deformities or cyanosis. NEUROLOGIC: No focal weakness or paresthesias are detected. SKIN: Continued bluish discoloration of the left great toe PSYCHIATRIC: The patient has a normal affect.  STUDIES:   I have reviewed her CT angiogram.  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  MEDICAL ISSUES:   Left blue toe syndrome: I discussed with the patient that I would like to proceed with angiography.  This will be done through a right femoral approach.  I would treat the right common iliac if I can get across this, most likely with a covered stent.  I would get extra imaging of the left groin and then treat the left superficial femoral artery if I could get across it.  I would place a filter and use a jet stream device.  This is been scheduled for Tuesday, November 20  Cousin the CT scan findings, I would like the patient to begin statin therapy.  She will continue with aspirin and Plavix.    Durene CalWells Anna-Marie Coller, MD [facility] Tel [phone] Pager [phone]

## 2017-05-15 NOTE — Progress Notes (Signed)
[facility] [place]  Patient name: Erin Ewing MRN: [id number] DOB: 02-24-1936 Sex: female   REASON FOR VISIT:    Follow up  HISOTRY OF PRESENT ILLNESS:    This is a 81 year old female who presented in October 2018 with a 2 week history of left great toe discoloration.  She attributes this to wearing tight fitting shoes.  She has had no other symptoms of claudication in the past.  She remains very active.  She does have a history of tobacco abuse.  She is medically managed for hypertension.  She is on a daily aspirin.  I scheduled her for a CT angiogram for better evaluation.  She is here today to discuss these results.  She denies any interval change   PAST MEDICAL HISTORY:   Past Medical History:  Diagnosis Date  . Arthritis    kneew  . Hypertension   . Obesity   . 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 Reflex sympathetic dystrophy 08/16/2012  . Subclinical hypothyroidism      FAMILY HISTORY:   Family History  Problem Relation Age of Onset  . Heart disease Father     SOCIAL HISTORY:   Social History   Tobacco Use  . Smoking status: Former Smoker    Packs/day: 0.50    Types: Cigarettes    Last attempt to quit: 04/07/2017    Years since quitting: 0.1  . Smokeless tobacco: Never Used  Substance Use Topics  . Alcohol use: No     ALLERGIES:   Allergies  Allergen Reactions  . Peanuts [Peanut Oil] Anaphylaxis  . Celebrex [Celecoxib] Diarrhea  . Diclofenac Diarrhea  . Fish Allergy      CURRENT MEDICATIONS:   Current Outpatient Medications  Medication Sig Dispense Refill  . amLODipine (NORVASC) 5 MG tablet Take 1 tablet (5 mg total) by mouth daily. 90 tablet 3  . aspirin 81 MG tablet Take 81 mg by mouth daily.    . calcium carbonate (CALCIUM 600 HIGH POTENCY) 600 MG TABS tablet Take 600 mg by mouth 2 (two) times daily with a meal.    . clopidogrel (PLAVIX) 75 MG tablet Take 1 tablet  (75 mg total) by mouth daily. 30 tablet 6  . Diphenhydramine-APAP, sleep, (TYLENOL PM EXTRA STRENGTH PO) Take by mouth.    . DULoxetine (CYMBALTA) 60 MG capsule Take 1 capsule (60 mg total) by mouth daily. 90 capsule 3  . levothyroxine (SYNTHROID, LEVOTHROID) 100 MCG tablet Take 1 tablet (100 mcg total) by mouth daily before breakfast. 90 tablet 4  . nabumetone (RELAFEN) 500 MG tablet Take 2 tablets (1,000 mg total) by mouth 2 (two) times daily. 360 tablet 3  . olmesartan-hydrochlorothiazide (BENICAR HCT) 40-12.5 MG tablet Take 1 tablet by mouth daily. 90 tablet 4  . Vitamin D, Ergocalciferol, (DRISDOL) 50000 units CAPS capsule Take 1 capsule (50,000 Units total) by mouth every 7 (seven) days. 13 capsule 3   No current facility-administered medications for this visit.     REVIEW OF SYSTEMS:   [X]  denotes positive finding, [ ]  denotes negative finding Cardiac  Comments:  Chest pain or chest pressure:    Shortness of breath upon exertion:    Short of breath when lying flat:    Irregular heart rhythm:        Vascular    Pain in calf, thigh, or hip brought on by ambulation:    Pain in feet at night that wakes you up from  your sleep:     Blood clot in your veins:    Leg swelling:         Pulmonary    Oxygen at home:    Productive cough:     Wheezing:         Neurologic    Sudden weakness in arms or legs:     Sudden numbness in arms or legs:     Sudden onset of difficulty speaking or slurred speech:    Temporary loss of vision in one eye:     Problems with dizziness:         Gastrointestinal    Blood in stool:     Vomited blood:         Genitourinary    Burning when urinating:     Blood in urine:        Psychiatric    Major depression:         Hematologic    Bleeding problems:    Problems with blood clotting too easily:        Skin    Rashes or ulcers:        Constitutional    Fever or chills:      PHYSICAL EXAM:   There were no vitals filed for this  visit.  GENERAL: The patient is a well-nourished female, in no acute distress. The vital signs are documented above. CARDIAC: There is a regular rate and rhythm.  VASCULAR: Pedal pulses are nonpalpable PULMONARY: Non-labored respirations MUSCULOSKELETAL: There are no major deformities or cyanosis. NEUROLOGIC: No focal weakness or paresthesias are detected. SKIN: Continued bluish discoloration of the left great toe PSYCHIATRIC: The patient has a normal affect.  STUDIES:   I have reviewed her CT angiogram.  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  MEDICAL ISSUES:   Left blue toe syndrome: I discussed with the patient that I would like to proceed with angiography.  This will be done through a right femoral approach.  I would treat the right common iliac if I can get across this, most likely with a covered stent.  I would get extra imaging of the left groin and then treat the left superficial femoral artery if I could get across it.  I would place a filter and use a jet stream device.  This is been scheduled for Tuesday, November 20  Cousin the CT scan findings, I would like the patient to begin statin therapy.  She will continue with aspirin and Plavix.    Durene CalWells Brabham, MD [facility] Tel [phone] Pager [phone]

## 2017-05-16 ENCOUNTER — Ambulatory Visit: Payer: Medicare Other | Admitting: Family Medicine

## 2017-05-30 ENCOUNTER — Ambulatory Visit (HOSPITAL_COMMUNITY)
Admission: RE | Admit: 2017-05-30 | Discharge: 2017-05-30 | Disposition: A | Discharge status: Home / Self Care | Payer: Medicare Other | Source: Ambulatory Visit | Attending: Surgery | Admitting: Surgery

## 2017-05-30 ENCOUNTER — Encounter (HOSPITAL_COMMUNITY)
Admission: RE | Disposition: A | Discharge status: Home / Self Care | Payer: Self-pay | Source: Ambulatory Visit | Attending: Surgery

## 2017-05-30 ENCOUNTER — Encounter (HOSPITAL_COMMUNITY): Payer: Self-pay | Admitting: Surgery

## 2017-05-30 DIAGNOSIS — Z6836 Body mass index (BMI) 36.0-36.9, adult: Secondary | ICD-10-CM | POA: Insufficient documentation

## 2017-05-30 DIAGNOSIS — E669 Obesity, unspecified: Secondary | ICD-10-CM | POA: Insufficient documentation

## 2017-05-30 DIAGNOSIS — Z87891 Personal history of nicotine dependence: Secondary | ICD-10-CM | POA: Diagnosis not present

## 2017-05-30 DIAGNOSIS — E039 Hypothyroidism, unspecified: Secondary | ICD-10-CM | POA: Insufficient documentation

## 2017-05-30 DIAGNOSIS — G9050 Complex regional pain syndrome I, unspecified: Secondary | ICD-10-CM | POA: Diagnosis not present

## 2017-05-30 DIAGNOSIS — Z7982 Long term (current) use of aspirin: Secondary | ICD-10-CM | POA: Insufficient documentation

## 2017-05-30 DIAGNOSIS — I75022 Atheroembolism of left lower extremity: Secondary | ICD-10-CM | POA: Insufficient documentation

## 2017-05-30 DIAGNOSIS — I10 Essential (primary) hypertension: Secondary | ICD-10-CM | POA: Insufficient documentation

## 2017-05-30 DIAGNOSIS — I739 Peripheral vascular disease, unspecified: Secondary | ICD-10-CM | POA: Insufficient documentation

## 2017-05-30 DIAGNOSIS — Z7902 Long term (current) use of antithrombotics/antiplatelets: Secondary | ICD-10-CM | POA: Insufficient documentation

## 2017-05-30 HISTORY — PX: PERIPHERAL VASCULAR INTERVENTION: CATH118257

## 2017-05-30 HISTORY — PX: PERIPHERAL VASCULAR ATHERECTOMY: CATH118256

## 2017-05-30 HISTORY — PX: ABDOMINAL AORTOGRAM W/LOWER EXTREMITY: CATH118223

## 2017-05-30 LAB — POCT I-STAT, CHEM 8
BUN: 18 mg/dL (ref 6–20)
Calcium, Ion: 1.18 mmol/L (ref 1.15–1.40)
Chloride: 105 mmol/L (ref 101–111)
Creatinine, Ser: 0.8 mg/dL (ref 0.44–1.00)
Glucose, Bld: 120 mg/dL — ABNORMAL HIGH (ref 65–99)
HCT: 46 % (ref 36.0–46.0)
Hemoglobin: 15.6 g/dL — ABNORMAL HIGH (ref 12.0–15.0)
Potassium: 4 mmol/L (ref 3.5–5.1)
Sodium: 139 mmol/L (ref 135–145)
TCO2: 25 mmol/L (ref 22–32)

## 2017-05-30 LAB — POCT ACTIVATED CLOTTING TIME
Activated Clotting Time: 268 seconds
Activated Clotting Time: 252 seconds

## 2017-05-30 SURGERY — ABDOMINAL AORTOGRAM W/LOWER EXTREMITY
Anesthesia: LOCAL | Laterality: Right

## 2017-05-30 MED ORDER — FENTANYL CITRATE (PF) 100 MCG/2ML IJ SOLN
INTRAMUSCULAR | Status: AC
  Filled 2017-05-30: qty 2

## 2017-05-30 MED ORDER — SODIUM CHLORIDE 0.9% FLUSH: 3.0000 mL | Freq: Two times a day (BID) | INTRAVENOUS | Status: DC

## 2017-05-30 MED ORDER — SODIUM CHLORIDE 0.9 % IV SOLN
INTRAVENOUS | Status: DC
  Administered 2017-05-30: 07:00:00 via INTRAVENOUS

## 2017-05-30 MED ORDER — LABETALOL HCL 5 MG/ML IV SOLN: 10.0000 mg | INTRAVENOUS | Status: DC | PRN

## 2017-05-30 MED ORDER — MORPHINE SULFATE (PF) 10 MG/ML IV SOLN: 1.0000 mg | INTRAVENOUS | Status: DC | PRN

## 2017-05-30 MED ORDER — SODIUM CHLORIDE 0.9 % IV SOLN: INTRAVENOUS | Status: AC

## 2017-05-30 MED ORDER — LIDOCAINE HCL (PF) 1 % IJ SOLN
INTRAMUSCULAR | Status: DC | PRN
  Administered 2017-05-30: 15 mL

## 2017-05-30 MED ORDER — SODIUM CHLORIDE 0.9% FLUSH: 3.0000 mL | INTRAVENOUS | Status: DC | PRN

## 2017-05-30 MED ORDER — HEPARIN SODIUM (PORCINE) 1000 UNIT/ML IJ SOLN
INTRAMUSCULAR | Status: DC | PRN
  Administered 2017-05-30 (×2): 1000 [IU] via INTRAVENOUS
  Administered 2017-05-30: 10000 [IU] via INTRAVENOUS

## 2017-05-30 MED ORDER — SODIUM CHLORIDE 0.9 % IV SOLN: 250.0000 mL | INTRAVENOUS | Status: DC | PRN

## 2017-05-30 MED ORDER — HEPARIN (PORCINE) IN NACL 2-0.9 UNIT/ML-% IJ SOLN
INTRAMUSCULAR | Status: AC
  Filled 2017-05-30: qty 1000

## 2017-05-30 MED ORDER — HEPARIN SODIUM (PORCINE) 1000 UNIT/ML IJ SOLN
INTRAMUSCULAR | Status: AC
  Filled 2017-05-30: qty 1

## 2017-05-30 MED ORDER — HEPARIN (PORCINE) IN NACL 2-0.9 UNIT/ML-% IJ SOLN
INTRAMUSCULAR | Status: AC | PRN
  Administered 2017-05-30: 1000 mL

## 2017-05-30 MED ORDER — IODIXANOL 320 MG/ML IV SOLN
INTRAVENOUS | Status: DC | PRN
  Administered 2017-05-30: 185 mL via INTRA_ARTERIAL

## 2017-05-30 MED ORDER — CLOPIDOGREL BISULFATE 300 MG PO TABS: 300.0000 mg | ORAL_TABLET | Freq: Once | ORAL | 0 refills | Status: AC

## 2017-05-30 MED ORDER — MIDAZOLAM HCL 2 MG/2ML IJ SOLN
INTRAMUSCULAR | Status: AC
  Filled 2017-05-30: qty 2

## 2017-05-30 MED ORDER — LIDOCAINE HCL (PF) 1 % IJ SOLN
INTRAMUSCULAR | Status: AC
  Filled 2017-05-30: qty 30

## 2017-05-30 MED ORDER — OXYCODONE HCL 5 MG PO TABS: 5.0000 mg | ORAL_TABLET | ORAL | Status: DC | PRN

## 2017-05-30 MED ORDER — CLOPIDOGREL BISULFATE 75 MG PO TABS: 75.0000 mg | ORAL_TABLET | Freq: Every day | ORAL | Status: DC

## 2017-05-30 MED ORDER — CLOPIDOGREL BISULFATE 75 MG PO TABS: 75.0000 mg | ORAL_TABLET | Freq: Every day | ORAL | 11 refills | Status: DC

## 2017-05-30 MED ORDER — MIDAZOLAM HCL 2 MG/2ML IJ SOLN
INTRAMUSCULAR | Status: DC | PRN
  Administered 2017-05-30: 1 mg via INTRAVENOUS
  Administered 2017-05-30: 2 mg via INTRAVENOUS
  Administered 2017-05-30 (×2): 1 mg via INTRAVENOUS

## 2017-05-30 MED ORDER — ASPIRIN EC 81 MG PO TBEC: 81.0000 mg | DELAYED_RELEASE_TABLET | Freq: Every day | ORAL | Status: DC

## 2017-05-30 MED ORDER — FENTANYL CITRATE (PF) 100 MCG/2ML IJ SOLN
INTRAMUSCULAR | Status: DC | PRN
  Administered 2017-05-30: 50 ug via INTRAVENOUS
  Administered 2017-05-30 (×3): 25 ug via INTRAVENOUS

## 2017-05-30 MED ORDER — ATORVASTATIN CALCIUM 10 MG PO TABS: 10.0000 mg | ORAL_TABLET | Freq: Every day | ORAL | Status: DC

## 2017-05-30 MED ORDER — CLOPIDOGREL BISULFATE 300 MG PO TABS: 300.0000 mg | ORAL_TABLET | Freq: Once | ORAL | Status: DC

## 2017-05-30 MED ORDER — HYDRALAZINE HCL 20 MG/ML IJ SOLN: 5.0000 mg | INTRAMUSCULAR | Status: DC | PRN

## 2017-05-30 SURGICAL SUPPLY — 26 items
BALLN LUTONIX DCB 6X80X130 (BALLOONS) ×4
BALLOON LUTONIX DCB 6X80X130 (BALLOONS) ×3 IMPLANT
BUR JETSTREAM XC 2.4/3.4 (BURR) ×3 IMPLANT
BURR JETSTREAM XC 2.4/3.4 (BURR) ×4
CATH ANGIO 5F BER2 100CM (CATHETERS) ×4 IMPLANT
CATH ANGIO 5F BER2 65CM (CATHETERS) ×4 IMPLANT
CATH OMNI FLUSH 5F 65CM (CATHETERS) ×8 IMPLANT
COVER PRB 48X5XTLSCP FOLD TPE (BAG) ×3 IMPLANT
COVER PROBE 5X48 (BAG) ×1
DEVICE CLOSURE PERCLS PRGLD 6F (VASCULAR PRODUCTS) ×3 IMPLANT
DRAPE ZERO GRAVITY STERILE (DRAPES) ×4 IMPLANT
KIT ENCORE 26 ADVANTAGE (KITS) ×4 IMPLANT
KIT MICROINTRODUCER STIFF 5F (SHEATH) ×4 IMPLANT
KIT PV (KITS) ×4 IMPLANT
PERCLOSE PROGLIDE 6F (VASCULAR PRODUCTS) ×4
SHEATH DESTINATION 8F 45CM (SHEATH) ×4 IMPLANT
SHEATH PINNACLE 5F 10CM (SHEATH) ×4 IMPLANT
SHEATH PINNACLE ST 7F 45CM (SHEATH) ×4 IMPLANT
STENT VIABAHNBX 10X39X135 (Permanent Stent) ×4 IMPLANT
SYR MEDRAD MARK V 150ML (SYRINGE) ×4 IMPLANT
TRANSDUCER W/STOPCOCK (MISCELLANEOUS) ×4 IMPLANT
TRAY PV CATH (CUSTOM PROCEDURE TRAY) ×4 IMPLANT
WIRE BENTSON .035X145CM (WIRE) ×4 IMPLANT
WIRE HI TORQ VERSACORE J 260CM (WIRE) ×4 IMPLANT
WIRE ROSEN-J .035X260CM (WIRE) ×4 IMPLANT
WIRE SPARTACORE .014X300CM (WIRE) ×4 IMPLANT

## 2017-05-30 NOTE — Interval H&P Note (Signed)
History and Physical Interval Note:  05/30/2017 7:27 AM  Erin SoxPatricia S Ewing  has presented today for surgery, with the diagnosis of pvd  The various methods of treatment have been discussed with the patient and family. After consideration of risks, benefits and other options for treatment, the patient has consented to  Procedure(s): ABDOMINAL AORTOGRAM W/LOWER EXTREMITY (N/A) as a surgical intervention .  The patient's history has been reviewed, patient examined, no change in status, stable for surgery.  I have reviewed the patient's chart and labs.  Questions were answered to the patient's satisfaction.     Durene CalWells Keaundre Thelin

## 2017-05-30 NOTE — Op Note (Signed)
Patient name: Erin Ewing MRN: [id number] DOB: 01-10-1936 Sex: female  05/30/2017 Pre-operative Diagnosis: Left blue toe syndrome Post-operative diagnosis:  Same Surgeon:  Durene CalWells Brabham Procedure Performed:  1.  Ultrasound-guided access, right femoral artery  2.  Abdominal aortogram  3.  Left lower extremity runoff  4.  Conscious sedation (136 minutes)  5.  Mechanical thrombectomy of the right external iliac, right common iliac, left common femoral, and left superficial femoral artery  6.  Percutaneous mechanical atherectomy of the right common iliac, left common femoral, and left superficial femoral artery  7.  Drug-coated balloon angioplasty, left superficial femoral artery  8.  Closure device  9.   stent, right common iliac artery   Indications: The patient suffers from blue toe syndrome.  A CT scan showed a lesion within the right iliac left common femoral and left superficial femoral artery.  She is here today for percutaneous intervention.  Procedure:  The patient was identified in the holding area and taken to room 8.  The patient was then placed supine on the table and prepped and draped in the usual sterile fashion.  A time out was called.  Conscious sedation was administered with the use of IV fentanyl and Versed under continuous physician and nurse monitoring.  Heart rate, blood pressure, and oxygen saturations were continuously monitored.  Ultrasound was used to evaluate the right common femoral artery.  It was patent .  A digital ultrasound image was acquired.  A micropuncture needle was used to access the right common femoral artery under ultrasound guidance.  An 018 wire was advanced without resistance and a micropuncture sheath was placed.  The 018 wire was removed and a benson wire was placed.  The micropuncture sheath was exchanged for a 5 french sheath.  I then carefully used a Bentson wire and a Berenstein 2 catheter to get wire access across the common iliac lesion.   This was done very smoothly with no resistance.  A Omni Flush catheter was advanced to the level of L1 and abdominal aortogram was performed.  Next the catheter and wire were used to cross the bifurcation.  The catheter was placed in the left external iliac artery the left leg runoff was performed.  Findings:   Aortogram: No significant renal artery stenosis.  The infrarenal abdominal aorta is widely patent.  There is a calcified nearly occlusive lesion with thrombus in the right common iliac artery.  The right external iliac and internal iliac artery are widely patent.  The left common, external and internal iliac artery are widely patent.  Right Lower Extremity: Not evaluated  Left Lower Extremity: The left common femoral artery has a filling defect within its midportion.  The profundofemoral artery is widely patent.  The superficial femoral artery is patent down to the adductor canal where there is a occlusive short segment lesion.  The remaining portion of the popliteal artery is patent with three-vessel runoff  Intervention: After the above images were acquired the decision was made to proceed with intervention.  A 7 French 45 cm sheath was inserted.  The patient was fully heparinized.  Heparin was checked with serial ACT's.  A Sparta core wire was advanced into the aorta.  The jetstream large atherectomy device was inserted.  I took a separate arteriogram after the sheath was inserted and there now appeared to be mobile thrombus within the right external iliac and distal common iliac artery.  I then used the jetstream device with blades down to  perform a thrombectomy of the right external iliac artery.  I then used the jetstream device with blades down to perform thrombectomy of the common iliac artery.  This was done with a past forward and backwards.  The blades were then put up and atherectomy of the common iliac artery was performed.  A contrast injection was then performed which showed resolution  of the thrombus.  I felt that the lesion needed to be stented.  I had to upsize to an 8x45 sheath in order to place a 10 x 39 VBX stent within the common iliac artery.  Completion imaging was done which showed widely patent common iliac artery with no residual thrombus.  I then inserted a Omni Flush catheter and Rosen wire to cross the aortic bifurcation with the 8 French sheath which was placed in the left external iliac artery.  I used a Berenstein 2 catheter across the lesion in the common femoral artery and placed the  wire in the proximal superficial femoral artery.  The jetstream device was then reinserted.  I used this to perform mechanical thrombectomy and atherectomy of the common femoral artery.  I passed forward and backward was performed.  Completion imaging showed persistent filling defect however the luminal flow channel was improved and there did not appear to be any mobile thrombus.  Attention was then turned towards the superficial artery lesion.  Using a Berenstein 2 catheter, the Sparta core wire was advanced across the lesion.  I elected not to place a filter because of the short segment lesion.  I then reinserted the jetstream device and performed mechanical thrombectomy as well as atherectomy of the superficial femoral artery lesion.  2 passes down the back with the device were performed.  I then selected a 6 x 80 Lutonix drug-coated balloon and performed balloon angioplasty of the left superficial femoral artery.  Follow-up imaging revealed complete resolution of the lesion and no change in runoff.  At this point decision was made to terminate the procedure.  Catheters and wires were removed.  I closed the right groin with a pro-glide.  There were no immediate complications.  Impression:  #1  Nearly occlusive lesion in the right common iliac artery with thrombus treated with atherectomy and thrombectomy using a jetstream device and subsequent covered stenting using a VBX 10 x 39 device  #2   Successful left common femoral atherectomy and thrombectomy using the jetstream device with mild residual filling defect but no mobile thrombus.  #3  Occlusive lesion in the left superficial femoral artery successfully treated with atherectomy and thrombectomy using a jetstream device and then drug-coated balloon angioplasty using a 6 x 80 Lutonix balloon.    Juleen ChinaV. Wells Brabham, M.D. [facility] [place] Office: [phone] Pager:  [phone]

## 2017-05-30 NOTE — Discharge Instructions (Signed)

## 2017-05-31 ENCOUNTER — Telehealth: Payer: Self-pay | Admitting: Surgery

## 2017-05-31 NOTE — Telephone Encounter (Signed)
-----   Message from Sharee PimpleMarilyn K McChesney, RN sent at 05/30/2017 10:12 AM EST ----- Regarding: 1 month postop w/ labs   ----- Message ----- From: Nada LibmanBrabham, Vance W, MD Sent: 05/30/2017  10:03 AM To: [facility] Charge Pool  05/30/2017:  Surgeon:  Durene CalWells Brabham Procedure Performed:  1.  Ultrasound-guided access, right femoral artery  2.  Abdominal aortogram  3.  Left lower extremity runoff  4.  Conscious sedation (136 minutes)  5.  Mechanical thrombectomy of the right external iliac, right common iliac, left common femoral, and left superficial femoral artery  6.  Percutaneous mechanical atherectomy of the right common iliac, left common femoral, and left superficial femoral artery  7.  Drug-coated balloon angioplasty, left superficial femoral artery  8.  Closure device  9.   stent, right common iliac artery   Follow-up 1 month to see me with duplex of the aortoiliac vessels and the left leg with ABIs and toe pressures.

## 2017-05-31 NOTE — Addendum Note (Signed)
Addended by: Burton ApleyPETTY, Liberty Seto A on: 05/31/2017 11:54 AM   Modules accepted: Orders

## 2017-05-31 NOTE — Telephone Encounter (Signed)
Sched labs 06/22/17 at 8:00 and MD 07/17/16 at 1:15. Lm on hm#.

## 2017-07-10 ENCOUNTER — Encounter (HOSPITAL_COMMUNITY): Payer: Medicare Other

## 2017-07-12 ENCOUNTER — Ambulatory Visit (HOSPITAL_COMMUNITY)
Admission: RE | Admit: 2017-07-12 | Discharge: 2017-07-12 | Disposition: A | Discharge status: Home / Self Care | Payer: Medicare Other | Source: Ambulatory Visit | Attending: Surgery | Admitting: Surgery

## 2017-07-12 DIAGNOSIS — I75022 Atheroembolism of left lower extremity: Secondary | ICD-10-CM

## 2017-07-12 DIAGNOSIS — Z9889 Other specified postprocedural states: Secondary | ICD-10-CM | POA: Insufficient documentation

## 2017-07-17 ENCOUNTER — Ambulatory Visit: Payer: Medicare Other | Admitting: Surgery

## 2017-07-24 ENCOUNTER — Ambulatory Visit: Payer: Medicare Other | Admitting: Surgery

## 2017-07-25 ENCOUNTER — Other Ambulatory Visit: Payer: Self-pay | Admitting: Family Medicine

## 2017-07-25 ENCOUNTER — Telehealth: Payer: Self-pay | Admitting: Family Medicine

## 2017-07-25 DIAGNOSIS — I10 Essential (primary) hypertension: Secondary | ICD-10-CM

## 2017-08-14 ENCOUNTER — Ambulatory Visit: Payer: Medicare Other | Admitting: Surgery

## 2017-08-14 ENCOUNTER — Encounter: Payer: Self-pay | Admitting: Surgery

## 2017-08-14 VITALS — BP 125/83 | HR 78 | Resp 18 | Ht 65.0 in | Wt 231.0 lb

## 2017-08-14 DIAGNOSIS — I739 Peripheral vascular disease, unspecified: Secondary | ICD-10-CM

## 2017-08-14 DIAGNOSIS — I70203 Unspecified atherosclerosis of native arteries of extremities, bilateral legs: Secondary | ICD-10-CM | POA: Diagnosis not present

## 2017-08-14 NOTE — Progress Notes (Addendum)
Postoperative Visit (Angio)   History of Present Illness   Erin Ewing is a 82 y.o. female who presents to review vascular imaging studies s/p mechanical thrombectomy and atherectomy of R CIA, EIA, CFA with R CIA VBX stent graft as well as L SFA atherectomy and balloon angioplasty by Dr. Myra GianottiBrabham 05/30/17.  This was performed due to left blue toe syndrome  The patient's L GT discoloration has resolved and it is now symmetrical in color to R GT.  The patient notes complete resolution of lower extremity symptoms.  The patient is able to complete their activities of daily living.  The patient's denies rest pain or further ischemic tissue changes.  She is taking aspirin and plavix daily.  She denies current tobacco use.  She still needs to follow up with PCP to discuss initiation of a statin.  Past Medical History, Past Surgical History, Social History, Family History, Medications, Allergies, and Review of Systems are unchanged from previous evaluation on 05/15/17.  Current Outpatient Medications  Medication Sig Dispense Refill  . amLODipine (NORVASC) 5 MG tablet TAKE 1 TABLET DAILY 90 tablet 0  . aspirin 81 MG tablet Take 81 mg by mouth daily.    . Calcium Carb-Cholecalciferol (CALCIUM 600/VITAMIN D3) 600-800 MG-UNIT TABS Take 1 tablet 2 (two) times daily by mouth.    . clopidogrel (PLAVIX) 75 MG tablet Take 1 tablet (75 mg total) by mouth daily. 30 tablet 6  . clopidogrel (PLAVIX) 75 MG tablet Take 1 tablet (75 mg total) daily by mouth. 30 tablet 11  . clopidogrel (PLAVIX) 75 MG tablet Take 1 tablet (75 mg total) by mouth daily. 30 tablet 11  . Diphenhydramine-APAP, sleep, (TYLENOL PM EXTRA STRENGTH PO) Take 1 tablet at bedtime as needed by mouth.     . DULoxetine (CYMBALTA) 60 MG capsule TAKE 1 CAPSULE DAILY 90 capsule 0  . levothyroxine (SYNTHROID, LEVOTHROID) 100 MCG tablet Take 1 tablet (100 mcg total) by mouth daily before breakfast. 90 tablet 4  . nabumetone (RELAFEN) 500 MG tablet  TAKE 2 TABLETS TWICE A DAY 360 tablet 0  . olmesartan-hydrochlorothiazide (BENICAR HCT) 40-12.5 MG tablet Take 1 tablet by mouth daily. 90 tablet 4   No current facility-administered medications for this visit.     ROS: 12 system reviewed and is otherwise negative unless noted in HPI   For VQI Use Only   PRE-ADM LIVING: Home  AMB STATUS: Ambulatory   Physical Examination   Vitals:   08/14/17 1428  BP: 125/83  Pulse: 78  Resp: 18  SpO2: 95%  Weight: 231 lb (104.8 kg)  Height: 5\' 5"  (1.651 m)   Body mass index is 38.44 kg/m.  General Alert, O x 3, WD, NAD  Pulmonary Sym exp, good B air movt, CTA B  Cardiac RRR, Nl S1, S2, no Murmurs, No rubs, No S3,S4  Vascular Vessel Right Left  Radial Palpable Palpable  Brachial Palpable Palpable  Carotid Palpable, No Bruit Palpable, No Bruit  Aorta Not palpable N/A  Femoral Palpable Palpable  Popliteal Not palpable Not palpable  PT Palpable Palpable  DP Faintly palpable Not palpable    Gastrointestinal soft, non-distended, non-tender to palpation  Musculoskeletal M/S 5/5 throughout  , Extremities without ischemic changes  , No edema present, No ischemic tissue changes BLE  Neurologic  Pain and light touch intact in extremities , Motor exam as listed above   Imaging: Patent R CIA stent graft without hemodynamically significant stenosis R ABI wnl L ABI wnl  Medical Decision Making   Erin Ewing is a 82 y.o. female who presents to review aortoiliac duplex as well as LLE arterial duplex with ABIs s/p mechanical thrombectomy and atherectomy of R CIA, EIA, CFA with R CIA VBX stent graft as well as L SFA atherectomy and balloon angioplasty by Dr. Myra Gianotti 05/30/17.  L GT blue discoloration has resolved Continue aspirin and plavix daily Recommended follow up with PCP for initiation of statin therapy. Continue smoking cessation Patient will follow up in 3 months to repeat same arterial studies per surveillance  protocol   Emilie Rutter, PA-C [facility] [phone] 08/14/2017  3:06 PM  I agree with the above.  I have seen and evaluated the patient.  She comes in today for follow-up of her left blue toe syndrome.  She is undergone covered stenting of her right common iliac artery as well as atherectomy and balloon angioplasty of her left superficial femoral artery.  This was performed on 05/30/2017.  Her symptoms have completely resolved.  I would like for her to continue aspirin and Plavix.  She will need to discuss with her primary care physician, the initiation of statin therapy for optimal medical management.  She is going to follow-up with me in 3 months for repeat arterial duplex studies.  Durene Cal

## 2017-09-19 ENCOUNTER — Encounter: Payer: Self-pay | Admitting: Family Medicine

## 2017-09-19 ENCOUNTER — Ambulatory Visit: Payer: Medicare Other | Admitting: Family Medicine

## 2017-09-19 VITALS — BP 111/74 | HR 85 | Temp 97.9°F | Ht 65.0 in | Wt 228.4 lb

## 2017-09-19 DIAGNOSIS — I739 Peripheral vascular disease, unspecified: Secondary | ICD-10-CM

## 2017-09-19 DIAGNOSIS — M1990 Unspecified osteoarthritis, unspecified site: Secondary | ICD-10-CM

## 2017-09-19 DIAGNOSIS — E559 Vitamin D deficiency, unspecified: Secondary | ICD-10-CM

## 2017-09-19 DIAGNOSIS — E039 Hypothyroidism, unspecified: Secondary | ICD-10-CM

## 2017-09-19 DIAGNOSIS — I10 Essential (primary) hypertension: Secondary | ICD-10-CM

## 2017-09-19 DIAGNOSIS — G4709 Other insomnia: Secondary | ICD-10-CM

## 2017-09-19 DIAGNOSIS — F331 Major depressive disorder, recurrent, moderate: Secondary | ICD-10-CM

## 2017-09-19 MED ORDER — ROSUVASTATIN CALCIUM 5 MG PO TABS: 5.0000 mg | ORAL_TABLET | Freq: Every day | ORAL | 1 refills | Status: DC

## 2017-09-19 MED ORDER — OLMESARTAN MEDOXOMIL-HCTZ 40-12.5 MG PO TABS: 1.0000 | ORAL_TABLET | Freq: Every day | ORAL | 4 refills | Status: DC

## 2017-09-19 MED ORDER — NABUMETONE 500 MG PO TABS: 1000.0000 mg | ORAL_TABLET | Freq: Two times a day (BID) | ORAL | 1 refills | Status: DC

## 2017-09-19 MED ORDER — DULOXETINE HCL 30 MG PO CPEP: 90.0000 mg | ORAL_CAPSULE | Freq: Every day | ORAL | 1 refills | Status: DC

## 2017-09-19 MED ORDER — LEVOTHYROXINE SODIUM 125 MCG PO TABS: 125.0000 ug | ORAL_TABLET | Freq: Every day | ORAL | 1 refills | Status: DC

## 2017-09-19 MED ORDER — AMLODIPINE BESYLATE 5 MG PO TABS: 5.0000 mg | ORAL_TABLET | Freq: Every day | ORAL | 1 refills | Status: DC

## 2017-09-19 NOTE — Progress Notes (Signed)
Subjective:  Patient ID: Erin Ewing, female    DOB: [birth year]  Age: 82 y.o. MRN: [id number]  CC: Hypertension (pt here today for routine follow up of her chronic medical conditions)   HPI DANITA PROUD presents for follow-up of hypertension. Patient has no history of headache chest pain or shortness of breath or recent cough. Patient also denies symptoms of TIA such as numbness weakness lateralizing. Patient checks  blood pressure at home and has not had any elevated readings recently. Patient denies side effects from his medication. States taking it regularly. Patient also in for follow-up of thyroid.  Her tests revealed borderline function 6 months ago.  Since that time her energy has steadily declined and her weight is started to climb.  She has become more depressed as well.  See PHQ score below.  Depression screen [facility] 2/9 09/19/2017 04/28/2017 10/05/2016  Decreased Interest 1 0 0  Down, Depressed, Hopeless 1 0 0  PHQ - 2 Score 2 0 0  Altered sleeping 3 - -  Tired, decreased energy 3 - -  Change in appetite 3 - -  Feeling bad or failure about yourself  0 - -  Trouble concentrating 3 - -  Moving slowly or fidgety/restless 0 - -  Suicidal thoughts 0 - -  PHQ-9 Score 14 - -    History Lucill has a past medical history of Arthritis, Hypertension, Obesity, Polycythemia, Reflex sympathetic dystrophy (08/16/2012), and Subclinical hypothyroidism.   She has a past surgical history that includes ABDOMINAL AORTOGRAM W/LOWER EXTREMITY (N/A, 05/30/2017); PERIPHERAL VASCULAR INTERVENTION (Right, 05/30/2017); and PERIPHERAL VASCULAR ATHERECTOMY (Left, 05/30/2017).   Her family history includes Heart disease in her father.She reports that she has been smoking cigarettes.  she has never used smokeless tobacco. She reports that she does not drink alcohol or use drugs.    ROS Review of Systems  Constitutional: Positive for appetite change and fatigue. Negative for activity change and  fever.  HENT: Negative for congestion, rhinorrhea and sore throat.   Eyes: Negative for visual disturbance.  Respiratory: Negative for cough and shortness of breath.   Cardiovascular: Negative for chest pain and palpitations.  Gastrointestinal: Negative for abdominal pain, diarrhea and nausea.  Genitourinary: Negative for dysuria.  Musculoskeletal: Positive for arthralgias (Primarily in the knees.  She is working with orthopedics and considering replacement). Negative for myalgias.  Psychiatric/Behavioral: Positive for decreased concentration, dysphoric mood and sleep disturbance.    Objective:  BP 111/74   Pulse 85   Temp 97.9 F (36.6 C) (Oral)   Ht '5\' 5"'  (1.651 m)   Wt 228 lb 6 oz (103.6 kg)   BMI 38.00 kg/m   BP Readings from Last 3 Encounters:  09/19/17 111/74  08/14/17 125/83  05/30/17 (!) 168/86    Wt Readings from Last 3 Encounters:  09/19/17 228 lb 6 oz (103.6 kg)  08/14/17 231 lb (104.8 kg)  05/30/17 220 lb (99.8 kg)     Physical Exam  Constitutional: She is oriented to person, place, and time. She appears well-developed and well-nourished. No distress.  HENT:  Head: Normocephalic and atraumatic.  Right Ear: External ear normal.  Left Ear: External ear normal.  Nose: Nose normal.  Mouth/Throat: Oropharynx is clear and moist.  Eyes: Conjunctivae and EOM are normal. Pupils are equal, round, and reactive to light.  Neck: Normal range of motion. Neck supple. No thyromegaly present.  Cardiovascular: Normal rate, regular rhythm and normal heart sounds.  No murmur heard. Pulmonary/Chest: Effort normal and  breath sounds normal. No respiratory distress. She has no wheezes. She has no rales.  Abdominal: Soft. Bowel sounds are normal. She exhibits no distension. There is no tenderness.  Lymphadenopathy:    She has no cervical adenopathy.  Neurological: She is alert and oriented to person, place, and time. She has normal reflexes.  Skin: Skin is warm and dry.    Psychiatric: She has a normal mood and affect. Her behavior is normal. Judgment and thought content normal.      Assessment & Plan:   Jolita was seen today for hypertension.  Diagnoses and all orders for this visit:  Essential hypertension -     CBC with Differential/Platelet -     CMP14+EGFR -     amLODipine (NORVASC) 5 MG tablet; Take 1 tablet (5 mg total) by mouth daily. -     olmesartan-hydrochlorothiazide (BENICAR HCT) 40-12.5 MG tablet; Take 1 tablet by mouth daily.  Hypothyroidism, unspecified type -     TSH -     T4, Free -     T3, Free  Arthritis  Other insomnia  Vitamin D deficiency  Moderate episode of recurrent major depressive disorder (HCC)  Peripheral arterial disease (HCC) -     Lipid panel  Other orders -     nabumetone (RELAFEN) 500 MG tablet; Take 2 tablets (1,000 mg total) by mouth 2 (two) times daily. -     rosuvastatin (CRESTOR) 5 MG tablet; Take 1 tablet (5 mg total) by mouth daily. -     levothyroxine (SYNTHROID, LEVOTHROID) 125 MCG tablet; Take 1 tablet (125 mcg total) by mouth daily. -     DULoxetine (CYMBALTA) 30 MG capsule; Take 3 capsules (90 mg total) by mouth daily.       I have discontinued Margarette Asal. Mcmann's levothyroxine and DULoxetine. I have also changed her amLODipine and nabumetone. Additionally, I am having her start on rosuvastatin, levothyroxine, and DULoxetine. Lastly, I am having her maintain her aspirin, (Diphenhydramine-APAP, sleep, (TYLENOL PM EXTRA STRENGTH PO)), Calcium Carb-Cholecalciferol, clopidogrel, and olmesartan-hydrochlorothiazide.  Allergies as of 09/19/2017      Reactions   Fish Allergy Anaphylaxis   Peanuts [peanut Oil] Anaphylaxis   Celebrex [celecoxib] Diarrhea   Diclofenac Diarrhea      Medication List        Accurate as of 09/19/17 12:22 PM. Always use your most recent med list.          amLODipine 5 MG tablet Commonly known as:  NORVASC Take 1 tablet (5 mg total) by mouth daily.    aspirin 81 MG tablet Take 81 mg by mouth daily.   CALCIUM 600/VITAMIN D3 600-800 MG-UNIT Tabs Generic drug:  Calcium Carb-Cholecalciferol Take 1 tablet 2 (two) times daily by mouth.   clopidogrel 75 MG tablet Commonly known as:  PLAVIX Take 1 tablet (75 mg total) by mouth daily.   DULoxetine 30 MG capsule Commonly known as:  CYMBALTA Take 3 capsules (90 mg total) by mouth daily.   levothyroxine 125 MCG tablet Commonly known as:  SYNTHROID, LEVOTHROID Take 1 tablet (125 mcg total) by mouth daily.   nabumetone 500 MG tablet Commonly known as:  RELAFEN Take 2 tablets (1,000 mg total) by mouth 2 (two) times daily.   olmesartan-hydrochlorothiazide 40-12.5 MG tablet Commonly known as:  BENICAR HCT Take 1 tablet by mouth daily.   rosuvastatin 5 MG tablet Commonly known as:  CRESTOR Take 1 tablet (5 mg total) by mouth daily.   TYLENOL PM EXTRA STRENGTH PO Take  1 tablet at bedtime as needed by mouth.        Follow-up: Return in about 6 weeks (around 10/31/2017).  Claretta Fraise, M.D.

## 2017-09-20 ENCOUNTER — Other Ambulatory Visit: Payer: Medicare Other

## 2017-09-21 LAB — CBC WITH DIFFERENTIAL/PLATELET
Basophils Absolute: 0 10*3/uL (ref 0.0–0.2)
Basos: 0 %
EOS (ABSOLUTE): 0.3 10*3/uL (ref 0.0–0.4)
Eos: 3 %
Hematocrit: 47.7 % — ABNORMAL HIGH (ref 34.0–46.6)
Hemoglobin: 15.9 g/dL (ref 11.1–15.9)
Immature Grans (Abs): 0 10*3/uL (ref 0.0–0.1)
Immature Granulocytes: 0 %
Lymphocytes Absolute: 1.9 10*3/uL (ref 0.7–3.1)
Lymphs: 21 %
MCH: 31 pg (ref 26.6–33.0)
MCHC: 33.3 g/dL (ref 31.5–35.7)
MCV: 93 fL (ref 79–97)
Monocytes Absolute: 0.5 10*3/uL (ref 0.1–0.9)
Monocytes: 6 %
Neutrophils Absolute: 6.3 10*3/uL (ref 1.4–7.0)
Neutrophils: 70 %
Platelets: 310 10*3/uL (ref 150–379)
RBC: 5.13 x10E6/uL (ref 3.77–5.28)
RDW: 14.5 % (ref 12.3–15.4)
WBC: 9 10*3/uL (ref 3.4–10.8)

## 2017-09-21 LAB — CMP14+EGFR
ALT: 18 IU/L (ref 0–32)
AST: 18 IU/L (ref 0–40)
Albumin/Globulin Ratio: 1.9 (ref 1.2–2.2)
Albumin: 4.3 g/dL (ref 3.5–4.8)
Alkaline Phosphatase: 98 IU/L (ref 39–117)
BUN/Creatinine Ratio: 16 (ref 12–28)
BUN: 12 mg/dL (ref 8–27)
Bilirubin Total: 0.4 mg/dL (ref 0.0–1.2)
CO2: 24 mmol/L (ref 20–29)
Calcium: 9.9 mg/dL (ref 8.7–10.3)
Chloride: 98 mmol/L (ref 96–106)
Creatinine, Ser: 0.75 mg/dL (ref 0.57–1.00)
GFR calc Af Amer: 88 mL/min/{1.73_m2} (ref >59)
GFR calc non Af Amer: 76 mL/min/{1.73_m2} (ref >59)
Globulin, Total: 2.3 g/dL (ref 1.5–4.5)
Glucose: 115 mg/dL — ABNORMAL HIGH (ref 65–99)
Potassium: 3.9 mmol/L (ref 3.5–5.2)
Sodium: 138 mmol/L (ref 134–144)
Total Protein: 6.6 g/dL (ref 6.0–8.5)

## 2017-09-21 LAB — LIPID PANEL
Chol/HDL Ratio: 3.4 ratio (ref 0.0–4.4)
Cholesterol, Total: 193 mg/dL (ref 100–199)
HDL: 57 mg/dL (ref >39)
LDL Calculated: 103 mg/dL — ABNORMAL HIGH (ref 0–99)
Triglycerides: 166 mg/dL — ABNORMAL HIGH (ref 0–149)
VLDL Cholesterol Cal: 33 mg/dL (ref 5–40)

## 2017-09-21 LAB — T4, FREE: Free T4: 1.7 ng/dL (ref 0.82–1.77)

## 2017-09-21 LAB — T3, FREE: T3, Free: 2.2 pg/mL (ref 2.0–4.4)

## 2017-09-21 LAB — TSH: TSH: 2.13 u[IU]/mL (ref 0.450–4.500)

## 2017-09-25 ENCOUNTER — Telehealth: Payer: Self-pay | Admitting: Family Medicine

## 2017-09-25 ENCOUNTER — Other Ambulatory Visit: Payer: Self-pay | Admitting: *Deleted

## 2017-09-25 DIAGNOSIS — I4891 Unspecified atrial fibrillation: Secondary | ICD-10-CM

## 2017-09-26 NOTE — Telephone Encounter (Signed)
Spoke to Newmont MiningHeather Irwin today who is on pt's [employer] and she would really like to speak with Dr Darlyn ReadStacks in regard to pt and some health concerns. She is aware that Dr Darlyn ReadStacks is out of the office today due to illness but I will send message.   Please call Herbert SetaHeather when you get a minute.

## 2017-09-27 NOTE — Telephone Encounter (Signed)
Niece is concerned. She fell a few days ago. Assumed she blacked out.. Attitude has changed. Short tempered. Incontinence of urine. Spaced out acting a lot. Also having diarrhea. Sleeping all the time. Gets up to eat and smoke. Seems depressed. Possibly urinary infection  I recommended increasing duloxetine to 30 mg 4 daily.Drop off urine specimen. Follo up in office in 1 week.

## 2017-09-28 ENCOUNTER — Telehealth: Payer: Self-pay | Admitting: Family Medicine

## 2017-09-28 NOTE — Telephone Encounter (Signed)
I can't see where the pt needed a cardiology referral? Pt is aware you are out of the office until Monday and that I would call her back after speaking with you. Does she need to see the cardiologist?

## 2017-10-02 ENCOUNTER — Other Ambulatory Visit: Payer: Self-pay | Admitting: Family Medicine

## 2017-10-02 NOTE — Telephone Encounter (Signed)
Please cancel cardiology. Send in another 3 mos of the statin

## 2017-10-02 NOTE — Telephone Encounter (Signed)
I was concerned that based on the extensive nature of the work vascular, Dr. Myra GianottiBrabham, had done on her iliac artery, that there might be some blockage in the heart as well.  Her blackout spell made me feel it should be checked out.

## 2017-10-02 NOTE — Telephone Encounter (Signed)
Patient states that she did not black out that her knees just bucked and she does not want to see a cardiologist. She said her family is just over reacting. She also wants you to know that the statin that was sent in is only good for 3 months

## 2017-10-24 ENCOUNTER — Other Ambulatory Visit: Payer: Self-pay

## 2017-10-24 DIAGNOSIS — I871 Compression of vein: Secondary | ICD-10-CM

## 2017-10-24 DIAGNOSIS — I75022 Atheroembolism of left lower extremity: Secondary | ICD-10-CM

## 2017-10-24 DIAGNOSIS — I739 Peripheral vascular disease, unspecified: Secondary | ICD-10-CM

## 2017-10-24 DIAGNOSIS — I70203 Unspecified atherosclerosis of native arteries of extremities, bilateral legs: Secondary | ICD-10-CM

## 2017-10-30 ENCOUNTER — Ambulatory Visit: Payer: Medicare Other | Admitting: Family Medicine

## 2017-11-03 ENCOUNTER — Ambulatory Visit: Payer: Medicare Other | Admitting: Family Medicine

## 2017-11-24 ENCOUNTER — Encounter (HOSPITAL_COMMUNITY): Payer: Medicare Other

## 2017-11-24 ENCOUNTER — Ambulatory Visit: Payer: Medicare Other | Admitting: Family

## 2017-12-19 ENCOUNTER — Ambulatory Visit: Payer: Medicare Other | Admitting: Family Medicine

## 2018-01-16 ENCOUNTER — Telehealth: Payer: Self-pay

## 2018-01-16 NOTE — Telephone Encounter (Signed)
Got a call from [facility] that insurance did not want to pay for 3 30 mg Duloxetine daily  Can you change to 1 60 mg and 1 30 mg qd???

## 2018-01-16 NOTE — Telephone Encounter (Signed)
I changed it to 60 mg 2 tabs daily.

## 2018-01-30 ENCOUNTER — Other Ambulatory Visit: Payer: Self-pay

## 2018-01-30 ENCOUNTER — Ambulatory Visit (INDEPENDENT_AMBULATORY_CARE_PROVIDER_SITE_OTHER)
Admission: RE | Admit: 2018-01-30 | Discharge: 2018-01-30 | Disposition: A | Discharge status: Home / Self Care | Payer: Medicare Other | Source: Ambulatory Visit | Attending: Surgery | Admitting: Surgery

## 2018-01-30 ENCOUNTER — Ambulatory Visit: Payer: Medicare Other | Admitting: Family

## 2018-01-30 ENCOUNTER — Ambulatory Visit (HOSPITAL_COMMUNITY)
Admission: RE | Admit: 2018-01-30 | Discharge: 2018-01-30 | Disposition: A | Discharge status: Home / Self Care | Payer: Medicare Other | Source: Ambulatory Visit | Attending: Surgery | Admitting: Surgery

## 2018-01-30 ENCOUNTER — Encounter: Payer: Self-pay | Admitting: Family

## 2018-01-30 VITALS — BP 115/78 | HR 70 | Temp 96.8°F | Resp 16 | Ht 66.0 in | Wt 221.3 lb

## 2018-01-30 DIAGNOSIS — I779 Disorder of arteries and arterioles, unspecified: Secondary | ICD-10-CM | POA: Diagnosis not present

## 2018-01-30 DIAGNOSIS — I739 Peripheral vascular disease, unspecified: Secondary | ICD-10-CM

## 2018-01-30 DIAGNOSIS — I70203 Unspecified atherosclerosis of native arteries of extremities, bilateral legs: Secondary | ICD-10-CM

## 2018-01-30 DIAGNOSIS — F17200 Nicotine dependence, unspecified, uncomplicated: Secondary | ICD-10-CM | POA: Diagnosis not present

## 2018-01-30 DIAGNOSIS — I871 Compression of vein: Secondary | ICD-10-CM | POA: Diagnosis not present

## 2018-01-30 DIAGNOSIS — I75022 Atheroembolism of left lower extremity: Secondary | ICD-10-CM

## 2018-01-30 DIAGNOSIS — I10 Essential (primary) hypertension: Secondary | ICD-10-CM | POA: Diagnosis not present

## 2018-01-30 DIAGNOSIS — Z95820 Peripheral vascular angioplasty status with implants and grafts: Secondary | ICD-10-CM | POA: Diagnosis not present

## 2018-01-30 NOTE — Patient Instructions (Signed)
Steps to Quit Smoking Smoking tobacco can be bad for your health. It can also affect almost every organ in your body. Smoking puts you and people around you at risk for many serious long-lasting (chronic) diseases. Quitting smoking is hard, but it is one of the best things that you can do for your health. It is never too late to quit. What are the benefits of quitting smoking? When you quit smoking, you lower your risk for getting serious diseases and conditions. They can include:  Lung cancer or lung disease.  Heart disease.  Stroke.  Heart attack.  Not being able to have children (infertility).  Weak bones (osteoporosis) and broken bones (fractures).  If you have coughing, wheezing, and shortness of breath, those symptoms may get better when you quit. You may also get sick less often. If you are pregnant, quitting smoking can help to lower your chances of having a baby of low birth weight. What can I do to help me quit smoking? Talk with your doctor about what can help you quit smoking. Some things you can do (strategies) include:  Quitting smoking totally, instead of slowly cutting back how much you smoke over a period of time.  Going to in-person counseling. You are more likely to quit if you go to many counseling sessions.  Using resources and support systems, such as: ? Online chats with a counselor. ? Phone quitlines. ? Printed self-help materials. ? Support groups or group counseling. ? Text messaging programs. ? Mobile phone apps or applications.  Taking medicines. Some of these medicines may have nicotine in them. If you are pregnant or breastfeeding, do not take any medicines to quit smoking unless your doctor says it is okay. Talk with your doctor about counseling or other things that can help you.  Talk with your doctor about using more than one strategy at the same time, such as taking medicines while you are also going to in-person counseling. This can help make  quitting easier. What things can I do to make it easier to quit? Quitting smoking might feel very hard at first, but there is a lot that you can do to make it easier. Take these steps:  Talk to your family and friends. Ask them to support and encourage you.  Call phone quitlines, reach out to support groups, or work with a counselor.  Ask people who smoke to not smoke around you.  Avoid places that make you want (trigger) to smoke, such as: ? Bars. ? Parties. ? Smoke-break areas at work.  Spend time with people who do not smoke.  Lower the stress in your life. Stress can make you want to smoke. Try these things to help your stress: ? Getting regular exercise. ? Deep-breathing exercises. ? Yoga. ? Meditating. ? Doing a body scan. To do this, close your eyes, focus on one area of your body at a time from head to toe, and notice which parts of your body are tense. Try to relax the muscles in those areas.  Download or buy apps on your mobile phone or tablet that can help you stick to your quit plan. There are many free apps, such as QuitGuide from the CDC (Centers for Disease Control and Prevention). You can find more support from smokefree.gov and other websites.  This information is not intended to replace advice given to you by your health care provider. Make sure you discuss any questions you have with your health care provider. Document Released: 04/23/2009 Document   Revised: 02/23/2016 Document Reviewed: 11/11/2014 Elsevier Interactive Patient Education  2018 Elsevier Inc.     Peripheral Vascular Disease Peripheral vascular disease (PVD) is a disease of the blood vessels that are not part of your heart and brain. A simple term for PVD is poor circulation. In most cases, PVD narrows the blood vessels that carry blood from your heart to the rest of your body. This can result in a decreased supply of blood to your arms, legs, and internal organs, like your stomach or kidneys.  However, it most often affects a person's lower legs and feet. There are two types of PVD.  Organic PVD. This is the more common type. It is caused by damage to the structure of blood vessels.  Functional PVD. This is caused by conditions that make blood vessels contract and tighten (spasm).  Without treatment, PVD tends to get worse over time. PVD can also lead to acute ischemic limb. This is when an arm or limb suddenly has trouble getting enough blood. This is a medical emergency. Follow these instructions at home:  Take medicines only as told by your doctor.  Do not use any tobacco products, including cigarettes, chewing tobacco, or electronic cigarettes. If you need help quitting, ask your doctor.  Lose weight if you are overweight, and maintain a healthy weight as told by your doctor.  Eat a diet that is low in fat and cholesterol. If you need help, ask your doctor.  Exercise regularly. Ask your doctor for some good activities for you.  Take good care of your feet. ? Wear comfortable shoes that fit well. ? Check your feet often for any cuts or sores. Contact a doctor if:  You have cramps in your legs while walking.  You have leg pain when you are at rest.  You have coldness in a leg or foot.  Your skin changes.  You are unable to get or have an erection (erectile dysfunction).  You have cuts or sores on your feet that are not healing. Get help right away if:  Your arm or leg turns cold and blue.  Your arms or legs become red, warm, swollen, painful, or numb.  You have chest pain or trouble breathing.  You suddenly have weakness in your face, arm, or leg.  You become very confused or you cannot speak.  You suddenly have a very bad headache.  You suddenly cannot see. This information is not intended to replace advice given to you by your health care provider. Make sure you discuss any questions you have with your health care provider. Document Released:  09/21/2009 Document Revised: 12/03/2015 Document Reviewed: 12/05/2013 Elsevier Interactive Patient Education  2017 Elsevier Inc.  

## 2018-01-30 NOTE — Progress Notes (Signed)
[facility] [place]   CC: Follow up peripheral artery occlusive disease  History of Present Illness Erin Ewing is a 82 y.o. female who is s/p mechanical thrombectomy and atherectomy of R CIA, EIA, CFA with R CIA VBX stent graft as well as L SFA atherectomy and balloon angioplasty by Dr. Myra Gianotti 05/30/17.  This was performed due to left blue toe syndrome  The patient's left great toe discoloration has resolved and it is now symmetrical in color to right great toe.  The patient notes complete resolution of lower extremity symptoms.  The patient's denies rest pain or further ischemic tissue changes.    Dr. Myra Gianotti and Judie Petit. Eveland PA-C last evaluated pt on 08-14-17. At that time her symptoms had completely resolved.  Dr. Myra Gianotti  Advised that pt continue aspirin and Plavix.  She was to follow-up in 3 months for repeat arterial duplex studies.   Diabetic: No Tobacco use: smoker  (1-2 cigs/day, started in her eary 20's)  Pt meds include: Statin :Yes Betablocker: No ASA: Yes Other anticoagulants/antiplatelets: Plavix  Past Medical History:  Diagnosis Date  . Arthritis    kneew  . Hypertension   . Obesity   . 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 Reflex sympathetic dystrophy 08/16/2012  . Subclinical hypothyroidism     Social History Social History   Tobacco Use  . Smoking status: Current Every Day Smoker    Types: Cigarettes  . Smokeless tobacco: Never Used  . Tobacco comment: 4-5 cigarettes per day  Substance Use Topics  . Alcohol use: No  . Drug use: No    Family History Family History  Problem Relation Age of Onset  . Heart disease Father     Past Surgical History:  Procedure Laterality Date  . ABDOMINAL AORTOGRAM W/LOWER EXTREMITY N/A 05/30/2017   Procedure: ABDOMINAL AORTOGRAM W/LOWER EXTREMITY;  Surgeon: Nada Libman, MD;  Location: [facility];  Service: Cardiovascular;  Laterality: N/A;  . PERIPHERAL VASCULAR  ATHERECTOMY Left 05/30/2017   Procedure: PERIPHERAL VASCULAR ATHERECTOMY;  Surgeon: Nada Libman, MD;  Location: [facility];  Service: Cardiovascular;  Laterality: Left;  superficial femoral  . PERIPHERAL VASCULAR INTERVENTION Right 05/30/2017   Procedure: PERIPHERAL VASCULAR INTERVENTION;  Surgeon: Nada Libman, MD;  Location: [facility];  Service: Cardiovascular;  Laterality: Right;  Common iliac    Allergies  Allergen Reactions  . Fish Allergy Anaphylaxis  . Peanuts [Peanut Oil] Anaphylaxis  . Celebrex [Celecoxib] Diarrhea  . Diclofenac Diarrhea    Current Outpatient Medications  Medication Sig Dispense Refill  . amLODipine (NORVASC) 5 MG tablet Take 1 tablet (5 mg total) by mouth daily. 90 tablet 1  . aspirin 81 MG tablet Take 81 mg by mouth daily.    . Calcium Carb-Cholecalciferol (CALCIUM 600/VITAMIN D3) 600-800 MG-UNIT TABS Take 1 tablet 2 (two) times daily by mouth.    . clopidogrel (PLAVIX) 75 MG tablet Take 1 tablet (75 mg total) by mouth daily. 30 tablet 11  . Diphenhydramine-APAP, sleep, (TYLENOL PM EXTRA STRENGTH PO) Take 1 tablet at bedtime as needed by mouth.     . DULoxetine (CYMBALTA) 30 MG capsule Take 3 capsules (90 mg total) by mouth daily. (Patient taking differently: Take 60 mg by mouth 2 (two) times daily. ) 270 capsule 1  . levothyroxine (SYNTHROID, LEVOTHROID) 125 MCG tablet Take 1 tablet (125 mcg total) by mouth daily. 90 tablet 1  . nabumetone (RELAFEN) 500 MG  tablet Take 2 tablets (1,000 mg total) by mouth 2 (two) times daily. 360 tablet 1  . olmesartan-hydrochlorothiazide (BENICAR HCT) 40-12.5 MG tablet Take 1 tablet by mouth daily. 90 tablet 4  . rosuvastatin (CRESTOR) 5 MG tablet Take 1 tablet (5 mg total) by mouth daily. 90 tablet 1   No current facility-administered medications for this visit.     ROS: See HPI for pertinent positives and negatives.   Physical Examination  Vitals:   01/30/18 0957  BP: 115/78  Pulse: 70   Resp: 16  Temp: (!) 96.8 F (36 C)  TempSrc: Oral  SpO2: 95%  Weight: 221 lb 4.8 oz (100.4 kg)  Height: 5\' 6"  (1.676 m)   Body mass index is 35.72 kg/m.  General: A&O x 3, WDWN, obese female. Gait: normal HENT: No gross abnormalities.  Eyes: PERRLA. Pulmonary: Respirations are non labored, CTAB, good air movement Cardiac: regular rhythm, no detected murmur.         Carotid Bruits Right Left   Negative Negative   Radial pulses are 2+ palpable bilaterally   Adominal aortic pulse is not palpable                         VASCULAR EXAM: Extremities without ischemic changes, without Gangrene; without open wounds. All toes of both feet are pink and warm with brisk capillary refill.                                                                                                           LE Pulses Right Left       FEMORAL  2+ palpable  2+ palpable        POPLITEAL  not palpable   not palpable       POSTERIOR TIBIAL  2+ palpable   not palpable        DORSALIS PEDIS      ANTERIOR TIBIAL 2+ palpable  2+ palpable    Abdomen: soft, NT, no palpable masses. Skin: no rashes, no cellulitis, no ulcers noted. Musculoskeletal: no muscle wasting or atrophy.  Neurologic: A&O X 3; appropriate affect, Sensation is normal; MOTOR FUNCTION:  moving all extremities equally, motor strength 5/5 throughout. Speech is fluent/normal. CN 2-12 intact. Psychiatric: Thought content is normal, mood appropriate for clinical situation.     ASSESSMENT: Erin Ewing is a 82 y.o. female who is s/p mechanical thrombectomy and atherectomy of R CIA, EIA, CFA with R CIA VBX stent graft as well as L SFA atherectomy and balloon angioplasty by Dr. Myra Gianotti 05/30/17.  This was performed due to left blue toe syndrome The patient's left great toe discoloration has resolved and it is now symmetrical in color to right great toe.  The patient notes complete resolution of lower extremity symptoms.     Her primary  atherosclerotic risk factor is her continued smoking since her early 20's.  Over 3 minutes was spent counseling patient re smoking cessation, and patient was given several free resources re smoking cessation.  315 cm/s velocity  in the proximal right CIA stent.  Bilateral femoral pulses are 2+ palpable.     DATA  Bilateral Aortoiliac Duplex (01-30-18): Highest velocity is 315 cm/s velocity in the proximal right CIA stent. All biphasic waveforms.  Left CIA with tri and biphasic waveforms, no stenosis.   Bilateral LE Arterial Duplex (01-30-18): No arterial occlusive disease in the bilateral LE arterial system; all triphasic waveforms.   ABI (Date: 01/30/2018):  R:   ABI: 0.98 (was 1.01 on 07-12-17),   PT: tri  DP: tri  TBI:  0.68 (was 0.88)  L:   ABI: 0.99 (was 1.23),   PT: tri  DP: tri  TBI: 0.71 (was 0.91) Bilateral ABI remain normal with all triphasic waveforms. Decline in bilateral TBI.     PLAN:  Based on the patient's vascular studies and examination, and after discussing with Dr. Arbie CookeyEarly, pt will return to clinic in 3 months with ABI's, bilateral aortoiliac duplex, and bilateral LE arterial duplex. See Dr. Myra GianottiBrabham afterward.   I discussed in depth with the patient the nature of atherosclerosis, and emphasized the importance of maximal medical management including strict control of blood pressure, blood glucose, and lipid levels, obtaining regular exercise, and cessation of smoking.  The patient is aware that without maximal medical management the underlying atherosclerotic disease process will progress, limiting the benefit of any interventions.  The patient was given information about PAD including signs, symptoms, treatment, what symptoms should prompt the patient to seek immediate medical care, and risk reduction measures to take.  Erin MarchSuzanne Helyne Genther, RN, MSN, FNP-C [facility] [place] Office Phone: [phone]  Clinic MD:  Early  01/30/18 10:09 AM

## 2018-02-08 ENCOUNTER — Other Ambulatory Visit: Payer: Self-pay

## 2018-02-08 DIAGNOSIS — I739 Peripheral vascular disease, unspecified: Secondary | ICD-10-CM

## 2018-02-08 DIAGNOSIS — I70203 Unspecified atherosclerosis of native arteries of extremities, bilateral legs: Secondary | ICD-10-CM

## 2018-02-08 DIAGNOSIS — I779 Disorder of arteries and arterioles, unspecified: Secondary | ICD-10-CM

## 2018-03-30 ENCOUNTER — Ambulatory Visit: Payer: Medicare Other | Admitting: Family Medicine

## 2018-04-10 ENCOUNTER — Other Ambulatory Visit: Payer: Self-pay | Admitting: Family Medicine

## 2018-04-10 DIAGNOSIS — I10 Essential (primary) hypertension: Secondary | ICD-10-CM

## 2018-04-30 ENCOUNTER — Ambulatory Visit (INDEPENDENT_AMBULATORY_CARE_PROVIDER_SITE_OTHER)
Admission: RE | Admit: 2018-04-30 | Discharge: 2018-04-30 | Disposition: A | Discharge status: Home / Self Care | Payer: Medicare Other | Source: Ambulatory Visit | Attending: Surgery | Admitting: Surgery

## 2018-04-30 ENCOUNTER — Ambulatory Visit: Payer: Medicare Other | Admitting: Surgery

## 2018-04-30 ENCOUNTER — Ambulatory Visit (HOSPITAL_COMMUNITY)
Admission: RE | Admit: 2018-04-30 | Discharge: 2018-04-30 | Disposition: A | Discharge status: Home / Self Care | Payer: Medicare Other | Source: Ambulatory Visit | Attending: Surgery | Admitting: Surgery

## 2018-04-30 ENCOUNTER — Encounter: Payer: Self-pay | Admitting: Surgery

## 2018-04-30 ENCOUNTER — Other Ambulatory Visit: Payer: Self-pay

## 2018-04-30 VITALS — BP 131/84 | HR 83 | Temp 96.6°F | Resp 16 | Ht 67.0 in | Wt 220.0 lb

## 2018-04-30 DIAGNOSIS — I779 Disorder of arteries and arterioles, unspecified: Secondary | ICD-10-CM

## 2018-04-30 DIAGNOSIS — I739 Peripheral vascular disease, unspecified: Secondary | ICD-10-CM | POA: Insufficient documentation

## 2018-04-30 DIAGNOSIS — I70203 Unspecified atherosclerosis of native arteries of extremities, bilateral legs: Secondary | ICD-10-CM

## 2018-04-30 DIAGNOSIS — I75022 Atheroembolism of left lower extremity: Secondary | ICD-10-CM | POA: Diagnosis not present

## 2018-04-30 NOTE — Progress Notes (Signed)
[facility] [place]  Patient name: Erin Ewing MRN: [id number] DOB: 05-Sep-1935 Sex: female   REASON FOR VISIT:    Follow up  HISOTRY OF PRESENT ILLNESS:    Erin SCHUL is a 82 y.o. female who presented in October 2018 with a 2-week history of left great toe discoloration.  A CT scan showed near total occlusion of the right common iliac artery.  Therefore on 05/30/2017 she underwent mechanical thrombectomy of the right iliac artery as well as atherectomy and drug-coated balloon angioplasty followed by stenting of her common iliac artery.  She has no complaints today.   She remains very active.  She does have a history of tobacco abuse.  She is medically managed for hypertension.   PAST MEDICAL HISTORY:   Past Medical History:  Diagnosis Date  . Arthritis    kneew  . Hypertension   . Obesity   . 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 Reflex sympathetic dystrophy 08/16/2012  . Subclinical hypothyroidism      FAMILY HISTORY:   Family History  Problem Relation Age of Onset  . Heart disease Father     SOCIAL HISTORY:   Social History   Tobacco Use  . Smoking status: Current Every Day Smoker    Types: Cigarettes  . Smokeless tobacco: Never Used  . Tobacco comment: 4-5 cigarettes per day  Substance Use Topics  . Alcohol use: No     ALLERGIES:   Allergies  Allergen Reactions  . Fish Allergy Anaphylaxis  . Peanuts [Peanut Oil] Anaphylaxis  . Celebrex [Celecoxib] Diarrhea  . Diclofenac Diarrhea     CURRENT MEDICATIONS:   Current Outpatient Medications  Medication Sig Dispense Refill  . amLODipine (NORVASC) 5 MG tablet Take 1 tablet (5 mg total) by mouth daily. (Needs to be seen before next refill) 90 tablet 0  . aspirin 81 MG tablet Take 81 mg by mouth daily.    . Calcium Carb-Cholecalciferol (CALCIUM 600/VITAMIN D3) 600-800 MG-UNIT TABS Take 1 tablet 2 (two) times daily by mouth.      . clopidogrel (PLAVIX) 75 MG tablet Take 1 tablet (75 mg total) by mouth daily. 30 tablet 11  . Diphenhydramine-APAP, sleep, (TYLENOL PM EXTRA STRENGTH PO) Take 1 tablet at bedtime as needed by mouth.     . DULoxetine (CYMBALTA) 30 MG capsule Take 3 capsules (90 mg total) by mouth daily. (Patient taking differently: Take 60 mg by mouth 2 (two) times daily. ) 270 capsule 1  . levothyroxine (SYNTHROID, LEVOTHROID) 125 MCG tablet TAKE 1 TABLET DAILY 90 tablet 1  . nabumetone (RELAFEN) 500 MG tablet Take 2 tablets (1,000 mg total) by mouth 2 (two) times daily. 360 tablet 1  . olmesartan-hydrochlorothiazide (BENICAR HCT) 40-12.5 MG tablet Take 1 tablet by mouth daily. 90 tablet 4  . rosuvastatin (CRESTOR) 5 MG tablet Take 1 tablet (5 mg total) by mouth daily. (Needs to be seen before next refill) 90 tablet 0   No current facility-administered medications for this visit.     REVIEW OF SYSTEMS:   [X]  denotes positive finding, [ ]  denotes negative finding Cardiac  Comments:  Chest pain or chest pressure:    Shortness of breath upon exertion:    Short of breath when lying flat:    Irregular heart rhythm:        Vascular    Pain in calf, thigh, or hip brought on by ambulation:    Pain in feet at night  that wakes you up from your sleep:     Blood clot in your veins:    Leg swelling:         Pulmonary    Oxygen at home:    Productive cough:     Wheezing:         Neurologic    Sudden weakness in arms or legs:     Sudden numbness in arms or legs:     Sudden onset of difficulty speaking or slurred speech:    Temporary loss of vision in one eye:     Problems with dizziness:         Gastrointestinal    Blood in stool:     Vomited blood:         Genitourinary    Burning when urinating:     Blood in urine:        Psychiatric    Major depression:         Hematologic    Bleeding problems:    Problems with blood clotting too easily:        Skin    Rashes or ulcers:         Constitutional    Fever or chills:      PHYSICAL EXAM:   Vitals:   04/30/18 1029  BP: 131/84  Pulse: 83  Resp: 16  Temp: (!) 96.6 F (35.9 C)  TempSrc: Oral  SpO2: 97%  Weight: 220 lb (99.8 kg)  Height: 5\' 7"  (1.702 m)    GENERAL: The patient is a well-nourished female, in no acute distress. The vital signs are documented above. CARDIAC: There is a regular rate and rhythm.  VASCULAR: Palpable dorsalis pedis pulse bilaterally PULMONARY: Non-labored respirations MUSCULOSKELETAL: There are no major deformities or cyanosis. NEUROLOGIC: No focal weakness or paresthesias are detected. SKIN: There are no ulcers or rashes noted. PSYCHIATRIC: The patient has a normal affect.  STUDIES:   I have ordered and reviewed her vascular lab studies with the following findings: Aortoiliac: Patent right common iliac stent with no evidence of stenosis.  +-------+-----------+-----------+------------+------------+ ABI/TBIToday's ABIToday's TBIPrevious ABIPrevious TBI +-------+-----------+-----------+------------+------------+ Right 1.06    0.71                 +-------+-----------+-----------+------------+------------+ Left  0.99    0.78                 +-------+-----------+-----------+------------+------------+  MEDICAL ISSUES:   Blue toe syndrome: The patient is doing very well.  Her stents are widely patent.  She will follow-up in 6 months with repeat vascular lab studies.    Durene Cal, MD [facility] [phone] Pager [phone]

## 2018-06-13 ENCOUNTER — Ambulatory Visit: Payer: Medicare Other | Admitting: Family Medicine

## 2018-07-18 ENCOUNTER — Other Ambulatory Visit: Payer: Self-pay | Admitting: Family Medicine

## 2018-07-18 NOTE — Telephone Encounter (Signed)
See below, last office visit 09/19/2017

## 2018-07-19 ENCOUNTER — Other Ambulatory Visit: Payer: Self-pay | Admitting: Family Medicine

## 2018-07-19 DIAGNOSIS — I10 Essential (primary) hypertension: Secondary | ICD-10-CM

## 2018-07-20 NOTE — Telephone Encounter (Signed)
OV 07/30/18 

## 2018-07-23 ENCOUNTER — Other Ambulatory Visit: Payer: Self-pay | Admitting: Surgery

## 2018-07-26 ENCOUNTER — Other Ambulatory Visit: Payer: Medicare HMO

## 2018-07-26 DIAGNOSIS — I739 Peripheral vascular disease, unspecified: Secondary | ICD-10-CM | POA: Diagnosis not present

## 2018-07-26 DIAGNOSIS — Z7689 Persons encountering health services in other specified circumstances: Secondary | ICD-10-CM | POA: Diagnosis not present

## 2018-07-26 DIAGNOSIS — E039 Hypothyroidism, unspecified: Secondary | ICD-10-CM | POA: Diagnosis not present

## 2018-07-27 LAB — CMP14+EGFR
ALT: 16 IU/L (ref 0–32)
AST: 12 IU/L (ref 0–40)
Albumin/Globulin Ratio: 2.3 — ABNORMAL HIGH (ref 1.2–2.2)
Albumin: 4.3 g/dL (ref 3.5–4.7)
Alkaline Phosphatase: 90 IU/L (ref 39–117)
BUN/Creatinine Ratio: 16 (ref 12–28)
BUN: 13 mg/dL (ref 8–27)
Bilirubin Total: 0.3 mg/dL (ref 0.0–1.2)
CO2: 25 mmol/L (ref 20–29)
Calcium: 10.1 mg/dL (ref 8.7–10.3)
Chloride: 99 mmol/L (ref 96–106)
Creatinine, Ser: 0.83 mg/dL (ref 0.57–1.00)
GFR calc Af Amer: 77 mL/min/{1.73_m2} (ref >59)
GFR calc non Af Amer: 67 mL/min/{1.73_m2} (ref >59)
Globulin, Total: 1.9 g/dL (ref 1.5–4.5)
Glucose: 103 mg/dL — ABNORMAL HIGH (ref 65–99)
Potassium: 4.9 mmol/L (ref 3.5–5.2)
Sodium: 141 mmol/L (ref 134–144)
Total Protein: 6.2 g/dL (ref 6.0–8.5)

## 2018-07-27 LAB — CBC WITH DIFFERENTIAL/PLATELET
Basophils Absolute: 0.1 10*3/uL (ref 0.0–0.2)
Basos: 1 %
EOS (ABSOLUTE): 0.3 10*3/uL (ref 0.0–0.4)
Eos: 3 %
Hematocrit: 50.5 % — ABNORMAL HIGH (ref 34.0–46.6)
Hemoglobin: 16.9 g/dL — ABNORMAL HIGH (ref 11.1–15.9)
Immature Grans (Abs): 0 10*3/uL (ref 0.0–0.1)
Immature Granulocytes: 0 %
Lymphocytes Absolute: 2.1 10*3/uL (ref 0.7–3.1)
Lymphs: 25 %
MCH: 30.6 pg (ref 26.6–33.0)
MCHC: 33.5 g/dL (ref 31.5–35.7)
MCV: 92 fL (ref 79–97)
Monocytes Absolute: 0.6 10*3/uL (ref 0.1–0.9)
Monocytes: 6 %
Neutrophils Absolute: 5.7 10*3/uL (ref 1.4–7.0)
Neutrophils: 65 %
Platelets: 351 10*3/uL (ref 150–450)
RBC: 5.52 x10E6/uL — ABNORMAL HIGH (ref 3.77–5.28)
RDW: 13.7 % (ref 11.7–15.4)
WBC: 8.7 10*3/uL (ref 3.4–10.8)

## 2018-07-27 LAB — LIPID PANEL
Chol/HDL Ratio: 3.4 ratio (ref 0.0–4.4)
Cholesterol, Total: 166 mg/dL (ref 100–199)
HDL: 49 mg/dL (ref >39)
LDL Calculated: 85 mg/dL (ref 0–99)
Triglycerides: 161 mg/dL — ABNORMAL HIGH (ref 0–149)
VLDL Cholesterol Cal: 32 mg/dL (ref 5–40)

## 2018-07-27 LAB — TSH: TSH: 3.75 u[IU]/mL (ref 0.450–4.500)

## 2018-07-30 ENCOUNTER — Ambulatory Visit: Payer: Medicare Other | Admitting: Family Medicine

## 2018-07-30 LAB — T4, FREE: Free T4: 1.24 ng/dL (ref 0.82–1.77)

## 2018-08-06 ENCOUNTER — Ambulatory Visit: Payer: Medicare HMO | Admitting: Family Medicine

## 2018-08-20 ENCOUNTER — Ambulatory Visit: Payer: Medicare HMO | Admitting: Family Medicine

## 2018-08-30 ENCOUNTER — Ambulatory Visit: Payer: Medicare HMO | Admitting: Family Medicine

## 2018-09-07 ENCOUNTER — Ambulatory Visit: Payer: Medicare HMO | Admitting: Family Medicine

## 2018-09-07 ENCOUNTER — Encounter: Payer: Self-pay | Admitting: Family Medicine

## 2018-09-07 VITALS — BP 146/77 | HR 59 | Temp 97.4°F | Ht 67.0 in | Wt 218.1 lb

## 2018-09-07 DIAGNOSIS — M1990 Unspecified osteoarthritis, unspecified site: Secondary | ICD-10-CM | POA: Diagnosis not present

## 2018-09-07 DIAGNOSIS — I10 Essential (primary) hypertension: Secondary | ICD-10-CM

## 2018-09-07 MED ORDER — NABUMETONE 500 MG PO TABS: 1000.0000 mg | ORAL_TABLET | Freq: Two times a day (BID) | ORAL | 1 refills | Status: DC

## 2018-09-07 MED ORDER — AMLODIPINE BESYLATE 5 MG PO TABS: 5.0000 mg | ORAL_TABLET | Freq: Every day | ORAL | 1 refills | Status: DC

## 2018-09-07 MED ORDER — OLMESARTAN MEDOXOMIL-HCTZ 40-12.5 MG PO TABS: 1.0000 | ORAL_TABLET | Freq: Every day | ORAL | 1 refills | Status: DC

## 2018-09-07 MED ORDER — CLOPIDOGREL BISULFATE 75 MG PO TABS: 75.0000 mg | ORAL_TABLET | Freq: Every day | ORAL | 1 refills | Status: DC

## 2018-09-07 MED ORDER — LEVOTHYROXINE SODIUM 125 MCG PO TABS: 125.0000 ug | ORAL_TABLET | Freq: Every day | ORAL | 1 refills | Status: DC

## 2018-09-07 NOTE — Patient Instructions (Signed)

## 2018-09-07 NOTE — Progress Notes (Signed)
Subjective:  Patient ID: Erin Ewing, female    DOB: 1936/04/02  Age: 83 y.o. MRN: [id number]  CC: Medical Management of Chronic Issues   HPI ANAGHA MCGILLIS presents for  follow-up of hypertension. Patient has no history of headache chest pain or shortness of breath or recent cough. Patient also denies symptoms of TIA such as focal numbness or weakness. Patient denies side effects from medication. States taking it regularly.   History Giannina has a past medical history of Arthritis, Hypertension, Obesity, Polycythemia, Reflex sympathetic dystrophy (08/16/2012), and Subclinical hypothyroidism.   She has a past surgical history that includes ABDOMINAL AORTOGRAM W/LOWER EXTREMITY (N/A, 05/30/2017); PERIPHERAL VASCULAR INTERVENTION (Right, 05/30/2017); and PERIPHERAL VASCULAR ATHERECTOMY (Left, 05/30/2017).   Her family history includes Heart disease in her father.She reports that she has been smoking cigarettes. She has never used smokeless tobacco. She reports that she does not drink alcohol or use drugs.  Current Outpatient Medications on File Prior to Visit  Medication Sig Dispense Refill  . aspirin 81 MG tablet Take 81 mg by mouth daily.    . Calcium Carb-Cholecalciferol (CALCIUM 600/VITAMIN D3) 600-800 MG-UNIT TABS Take 1 tablet 2 (two) times daily by mouth.    . Diphenhydramine-APAP, sleep, (TYLENOL PM EXTRA STRENGTH PO) Take 1 tablet at bedtime as needed by mouth.      No current facility-administered medications on file prior to visit.     ROS Review of Systems  Constitutional: Negative.   HENT: Negative for congestion.   Eyes: Negative for visual disturbance.  Respiratory: Negative for shortness of breath.   Cardiovascular: Negative for chest pain.  Gastrointestinal: Negative for abdominal pain, constipation, diarrhea, nausea and vomiting.  Genitourinary: Negative for difficulty urinating.  Musculoskeletal: Negative for arthralgias and myalgias.  Neurological:  Negative for headaches.  Psychiatric/Behavioral: Negative for sleep disturbance.    Objective:  BP (!) 146/77   Pulse (!) 59   Temp (!) 97.4 F (36.3 C) (Oral)   Ht 5\' 7"  (1.702 m)   Wt 218 lb 2 oz (98.9 kg)   BMI 34.16 kg/m   BP Readings from Last 3 Encounters:  09/07/18 (!) 146/77  04/30/18 131/84  01/30/18 115/78    Wt Readings from Last 3 Encounters:  09/07/18 218 lb 2 oz (98.9 kg)  04/30/18 220 lb (99.8 kg)  01/30/18 221 lb 4.8 oz (100.4 kg)     Physical Exam Constitutional:      General: She is not in acute distress.    Appearance: She is well-developed.  HENT:     Head: Normocephalic and atraumatic.     Right Ear: External ear normal.     Left Ear: External ear normal.     Nose: Nose normal.  Eyes:     Conjunctiva/sclera: Conjunctivae normal.     Pupils: Pupils are equal, round, and reactive to light.  Neck:     Musculoskeletal: Normal range of motion and neck supple.     Thyroid: No thyromegaly.  Cardiovascular:     Rate and Rhythm: Normal rate and regular rhythm.     Heart sounds: Normal heart sounds. No murmur.  Pulmonary:     Effort: Pulmonary effort is normal. No respiratory distress.     Breath sounds: Normal breath sounds. No wheezing or rales.  Abdominal:     General: Bowel sounds are normal. There is no distension.     Palpations: Abdomen is soft.     Tenderness: There is no abdominal tenderness.  Lymphadenopathy:  Cervical: No cervical adenopathy.  Skin:    General: Skin is warm and dry.  Neurological:     Mental Status: She is alert and oriented to person, place, and time.     Deep Tendon Reflexes: Reflexes are normal and symmetric.  Psychiatric:        Behavior: Behavior normal.        Thought Content: Thought content normal.        Judgment: Judgment normal.       Assessment & Plan:   Aidana was seen today for medical management of chronic issues.  Diagnoses and all orders for this visit:  Essential hypertension -      amLODipine (NORVASC) 5 MG tablet; Take 1 tablet (5 mg total) by mouth daily. -     olmesartan-hydrochlorothiazide (BENICAR HCT) 40-12.5 MG tablet; Take 1 tablet by mouth daily.  Arthritis  Other orders -     clopidogrel (PLAVIX) 75 MG tablet; Take 1 tablet (75 mg total) by mouth daily. -     levothyroxine (SYNTHROID, LEVOTHROID) 125 MCG tablet; Take 1 tablet (125 mcg total) by mouth daily. -     nabumetone (RELAFEN) 500 MG tablet; Take 2 tablets (1,000 mg total) by mouth 2 (two) times daily.   Allergies as of 09/07/2018      Reactions   Fish Allergy Anaphylaxis   Peanuts [peanut Oil] Anaphylaxis   Celebrex [celecoxib] Diarrhea   Diclofenac Diarrhea      Medication List       Accurate as of September 07, 2018 11:59 PM. Always use your most recent med list.        amLODipine 5 MG tablet Commonly known as:  NORVASC Take 1 tablet (5 mg total) by mouth daily.   aspirin 81 MG tablet Take 81 mg by mouth daily.   CALCIUM 600/VITAMIN D3 600-800 MG-UNIT Tabs Generic drug:  Calcium Carb-Cholecalciferol Take 1 tablet 2 (two) times daily by mouth.   clopidogrel 75 MG tablet Commonly known as:  PLAVIX Take 1 tablet (75 mg total) by mouth daily.   levothyroxine 125 MCG tablet Commonly known as:  SYNTHROID, LEVOTHROID Take 1 tablet (125 mcg total) by mouth daily.   nabumetone 500 MG tablet Commonly known as:  RELAFEN Take 2 tablets (1,000 mg total) by mouth 2 (two) times daily.   olmesartan-hydrochlorothiazide 40-12.5 MG tablet Commonly known as:  BENICAR HCT Take 1 tablet by mouth daily.   TYLENOL PM EXTRA STRENGTH PO Take 1 tablet at bedtime as needed by mouth.       Meds ordered this encounter  Medications  . amLODipine (NORVASC) 5 MG tablet    Sig: Take 1 tablet (5 mg total) by mouth daily.    Dispense:  90 tablet    Refill:  1  . olmesartan-hydrochlorothiazide (BENICAR HCT) 40-12.5 MG tablet    Sig: Take 1 tablet by mouth daily.    Dispense:  90 tablet     Refill:  1  . clopidogrel (PLAVIX) 75 MG tablet    Sig: Take 1 tablet (75 mg total) by mouth daily.    Dispense:  90 tablet    Refill:  1  . levothyroxine (SYNTHROID, LEVOTHROID) 125 MCG tablet    Sig: Take 1 tablet (125 mcg total) by mouth daily.    Dispense:  90 tablet    Refill:  1  . nabumetone (RELAFEN) 500 MG tablet    Sig: Take 2 tablets (1,000 mg total) by mouth 2 (two) times daily.  Dispense:  360 tablet    Refill:  1      Follow-up: Return in about 3 months (around 12/06/2018).  Mechele Claude, M.D.

## 2018-09-11 ENCOUNTER — Encounter: Payer: Self-pay | Admitting: Family Medicine

## 2018-10-16 ENCOUNTER — Other Ambulatory Visit: Payer: Self-pay

## 2018-10-16 DIAGNOSIS — I739 Peripheral vascular disease, unspecified: Secondary | ICD-10-CM

## 2018-10-16 DIAGNOSIS — I70203 Unspecified atherosclerosis of native arteries of extremities, bilateral legs: Secondary | ICD-10-CM

## 2018-10-30 ENCOUNTER — Encounter (HOSPITAL_COMMUNITY): Payer: Medicare Other

## 2018-10-30 ENCOUNTER — Ambulatory Visit: Payer: Medicare Other | Admitting: Family

## 2019-01-25 ENCOUNTER — Encounter: Payer: Self-pay | Admitting: *Deleted

## 2019-03-07 ENCOUNTER — Ambulatory Visit: Payer: Medicare HMO | Admitting: Family Medicine

## 2019-03-08 ENCOUNTER — Ambulatory Visit: Payer: Medicare HMO | Admitting: Family Medicine

## 2019-03-08 ENCOUNTER — Ambulatory Visit (HOSPITAL_COMMUNITY): Payer: Self-pay

## 2019-03-08 ENCOUNTER — Ambulatory Visit (HOSPITAL_COMMUNITY): Payer: Self-pay | Attending: Surgery

## 2019-03-13 ENCOUNTER — Ambulatory Visit: Payer: Self-pay | Admitting: Family

## 2019-04-12 ENCOUNTER — Other Ambulatory Visit: Payer: Self-pay | Admitting: Family Medicine

## 2019-04-22 ENCOUNTER — Other Ambulatory Visit: Payer: Self-pay | Admitting: *Deleted

## 2019-05-21 ENCOUNTER — Telehealth: Payer: Self-pay | Admitting: Family Medicine

## 2019-05-21 MED ORDER — CLOPIDOGREL BISULFATE 75 MG PO TABS: 75.0000 mg | ORAL_TABLET | Freq: Every day | ORAL | 1 refills | Status: DC

## 2019-05-21 NOTE — Telephone Encounter (Signed)
Patient aware.

## 2019-05-21 NOTE — Telephone Encounter (Signed)
What is the name of the medication? clopidogrel (PLAVIX) 75 MG tablet  Have you contacted your pharmacy to request a refill?no  Which pharmacy [facility] you [facility] sent [facility]   Patient notified that their request is being sent to the clinical staff for review and that they should receive a call once it is complete. If they do not receive a call within 24 hours they can check with their pharmacy or our office.

## 2019-05-21 NOTE — Telephone Encounter (Signed)
Med sent in.

## 2019-05-22 ENCOUNTER — Ambulatory Visit: Payer: Medicare HMO | Admitting: Family Medicine

## 2019-05-30 ENCOUNTER — Ambulatory Visit: Payer: Medicare HMO | Admitting: Family Medicine

## 2019-05-31 ENCOUNTER — Other Ambulatory Visit: Payer: Self-pay | Admitting: Family Medicine

## 2019-05-31 MED ORDER — NABUMETONE 500 MG PO TABS: 1000.0000 mg | ORAL_TABLET | Freq: Two times a day (BID) | ORAL | 0 refills | Status: DC

## 2019-05-31 NOTE — Telephone Encounter (Signed)
What is the name of the medication? Nabumetone 500 mg Patient is out and has appt 12-2 with [facility]  Have you contacted your pharmacy to request a refill? NO  Which pharmacy would you like this sent to? [facility]   Patient notified that their request is being sent to the clinical staff for review and that they should receive a call once it is complete. If they do not receive a call within 24 hours they can check with their pharmacy or our office.

## 2019-05-31 NOTE — Telephone Encounter (Signed)
NA/NVM Refill sent to pharmacy Appt 06/12/19

## 2019-06-03 NOTE — Telephone Encounter (Signed)
Pt aware refill sent in on 05/31/19

## 2019-06-11 ENCOUNTER — Other Ambulatory Visit: Payer: Self-pay

## 2019-06-12 ENCOUNTER — Ambulatory Visit: Payer: Medicare HMO | Admitting: Family Medicine

## 2019-06-12 ENCOUNTER — Other Ambulatory Visit: Payer: Self-pay

## 2019-06-12 DIAGNOSIS — I10 Essential (primary) hypertension: Secondary | ICD-10-CM

## 2019-06-12 MED ORDER — OLMESARTAN MEDOXOMIL-HCTZ 40-12.5 MG PO TABS: 1.0000 | ORAL_TABLET | Freq: Every day | ORAL | 0 refills | Status: DC

## 2019-06-18 ENCOUNTER — Other Ambulatory Visit: Payer: Self-pay

## 2019-06-19 ENCOUNTER — Ambulatory Visit: Payer: Medicare HMO | Admitting: Family Medicine

## 2019-06-19 ENCOUNTER — Other Ambulatory Visit: Payer: Self-pay

## 2019-06-19 ENCOUNTER — Encounter: Payer: Self-pay | Admitting: Family Medicine

## 2019-06-19 VITALS — BP 108/77 | HR 106 | Temp 96.0°F | Ht 67.0 in | Wt 189.0 lb

## 2019-06-19 DIAGNOSIS — E039 Hypothyroidism, unspecified: Secondary | ICD-10-CM | POA: Diagnosis not present

## 2019-06-19 DIAGNOSIS — F331 Major depressive disorder, recurrent, moderate: Secondary | ICD-10-CM

## 2019-06-19 DIAGNOSIS — R69 Illness, unspecified: Secondary | ICD-10-CM | POA: Diagnosis not present

## 2019-06-19 DIAGNOSIS — I10 Essential (primary) hypertension: Secondary | ICD-10-CM | POA: Diagnosis not present

## 2019-06-19 MED ORDER — OLMESARTAN MEDOXOMIL-HCTZ 40-12.5 MG PO TABS: 1.0000 | ORAL_TABLET | Freq: Every day | ORAL | 0 refills | Status: DC

## 2019-06-19 MED ORDER — DULOXETINE HCL 30 MG PO CPEP: 30.0000 mg | ORAL_CAPSULE | Freq: Two times a day (BID) | ORAL | 0 refills | Status: DC

## 2019-06-19 MED ORDER — NABUMETONE 500 MG PO TABS: 1000.0000 mg | ORAL_TABLET | Freq: Two times a day (BID) | ORAL | 0 refills | Status: DC

## 2019-06-19 MED ORDER — LEVOTHYROXINE SODIUM 125 MCG PO TABS: 125.0000 ug | ORAL_TABLET | Freq: Every day | ORAL | 1 refills | Status: DC

## 2019-06-19 MED ORDER — CLOPIDOGREL BISULFATE 75 MG PO TABS: 75.0000 mg | ORAL_TABLET | Freq: Every day | ORAL | 1 refills | Status: DC

## 2019-06-20 LAB — CBC WITH DIFFERENTIAL/PLATELET
Basophils Absolute: 0.1 10*3/uL (ref 0.0–0.2)
Basos: 1 %
EOS (ABSOLUTE): 0.2 10*3/uL (ref 0.0–0.4)
Eos: 2 %
Hematocrit: 48 % — ABNORMAL HIGH (ref 34.0–46.6)
Hemoglobin: 16.3 g/dL — ABNORMAL HIGH (ref 11.1–15.9)
Immature Grans (Abs): 0 10*3/uL (ref 0.0–0.1)
Immature Granulocytes: 0 %
Lymphocytes Absolute: 1.6 10*3/uL (ref 0.7–3.1)
Lymphs: 19 %
MCH: 32 pg (ref 26.6–33.0)
MCHC: 34 g/dL (ref 31.5–35.7)
MCV: 94 fL (ref 79–97)
Monocytes Absolute: 0.5 10*3/uL (ref 0.1–0.9)
Monocytes: 6 %
Neutrophils Absolute: 6.2 10*3/uL (ref 1.4–7.0)
Neutrophils: 72 %
Platelets: 287 10*3/uL (ref 150–450)
RBC: 5.1 x10E6/uL (ref 3.77–5.28)
RDW: 14.2 % (ref 11.7–15.4)
WBC: 8.6 10*3/uL (ref 3.4–10.8)

## 2019-06-20 LAB — CMP14+EGFR
ALT: 13 IU/L (ref 0–32)
AST: 12 IU/L (ref 0–40)
Albumin/Globulin Ratio: 1.8 (ref 1.2–2.2)
Albumin: 4.2 g/dL (ref 3.6–4.6)
Alkaline Phosphatase: 89 IU/L (ref 39–117)
BUN/Creatinine Ratio: 12 (ref 12–28)
BUN: 10 mg/dL (ref 8–27)
Bilirubin Total: 0.4 mg/dL (ref 0.0–1.2)
CO2: 23 mmol/L (ref 20–29)
Calcium: 9.8 mg/dL (ref 8.7–10.3)
Chloride: 103 mmol/L (ref 96–106)
Creatinine, Ser: 0.83 mg/dL (ref 0.57–1.00)
GFR calc Af Amer: 76 mL/min/{1.73_m2} (ref >59)
GFR calc non Af Amer: 66 mL/min/{1.73_m2} (ref >59)
Globulin, Total: 2.4 g/dL (ref 1.5–4.5)
Glucose: 107 mg/dL — ABNORMAL HIGH (ref 65–99)
Potassium: 3.7 mmol/L (ref 3.5–5.2)
Sodium: 141 mmol/L (ref 134–144)
Total Protein: 6.6 g/dL (ref 6.0–8.5)

## 2019-06-20 LAB — TSH+FREE T4
Free T4: 1.63 ng/dL (ref 0.82–1.77)
TSH: 4.32 u[IU]/mL (ref 0.450–4.500)

## 2019-06-20 NOTE — Progress Notes (Signed)
Hello Erin Ewing, ? ?Your lab result is normal and/or stable.Some minor variations that are not significant are commonly marked abnormal, but do not represent any medical problem for you. ? ?Best regards, ?Kayanna Mckillop, M.D.

## 2019-06-21 NOTE — Progress Notes (Signed)
Subjective:  Patient ID: Erin Ewing, female    DOB: 1936/06/07  Age: 83 y.o. MRN: [id number]  CC: Medical Management of Chronic Issues (6 mth, Patient states she is depressed was on an anti depressant and did not know the name)   HPI Erin Ewing presents for feeling sad, tearful daily, disinterested in activities. Limited by [employer] from social interaction which has caused isolation.   presents for  follow-up of hypertension. Patient has no history of headache chest pain or shortness of breath or recent cough. Patient also denies symptoms of TIA such as focal numbness or weakness. Patient denies side effects from medication. States taking it regularly.   follow-up on  thyroid. The patient has a history of hypothyroidism for many years. It has been stable recently. Pt. denies any change in  voice, loss of hair, heat or cold intolerance. Energy level has been adequate to good. Patient denies constipation and diarrhea. No myxedema. Medication is as noted below. Verified that pt is taking it daily on an empty stomach. Well tolerated.    Depression screen [facility] 2/9 06/19/2019 09/07/2018 09/19/2017  Decreased Interest 0 0 1  Down, Depressed, Hopeless 2 0 1  PHQ - 2 Score 2 0 2  Altered sleeping 2 - 3  Tired, decreased energy 0 - 3  Change in appetite 2 - 3  Feeling bad or failure about yourself  0 - 0  Trouble concentrating 2 - 3  Moving slowly or fidgety/restless 0 - 0  Suicidal thoughts 0 - 0  PHQ-9 Score 8 - 14  Difficult doing work/chores Not difficult at all - -    History Erin Ewing has a past medical history of Arthritis, Hypertension, Obesity, Polycythemia, Reflex sympathetic dystrophy (08/16/2012), and Subclinical hypothyroidism.   She has a past surgical history that includes ABDOMINAL AORTOGRAM W/LOWER EXTREMITY (N/A, 05/30/2017); PERIPHERAL VASCULAR INTERVENTION (Right, 05/30/2017); and PERIPHERAL VASCULAR ATHERECTOMY (Left, 05/30/2017).   Her family history includes Heart  disease in her father.She reports that she has been smoking cigarettes. She has never used smokeless tobacco. She reports that she does not drink alcohol or use drugs.    ROS Review of Systems  Objective:  BP 108/77   Pulse (!) 106   Temp (!) 96 F (35.6 C) (Temporal)   Ht 5\' 7"  (1.702 m)   Wt 189 lb (85.7 kg)   SpO2 98%   BMI 29.60 kg/m   BP Readings from Last 3 Encounters:  06/19/19 108/77  09/07/18 (!) 146/77  04/30/18 131/84    Wt Readings from Last 3 Encounters:  06/19/19 189 lb (85.7 kg)  09/07/18 218 lb 2 oz (98.9 kg)  04/30/18 220 lb (99.8 kg)     Physical Exam    Assessment & Plan:   Erin Ewing was seen today for medical management of chronic issues.  Diagnoses and all orders for this visit:  Hypothyroidism, unspecified type -     CBC -     CMP -     TSH + free T4  Essential hypertension -     olmesartan-hydrochlorothiazide (BENICAR HCT) 40-12.5 MG tablet; Take 1 tablet by mouth daily. -     CBC -     CMP -     TSH + free T4  Moderate episode of recurrent major depressive disorder (HCC) -     CBC -     CMP -     TSH + free T4  Other orders -     clopidogrel (PLAVIX) 75  MG tablet; Take 1 tablet (75 mg total) by mouth daily. -     levothyroxine (SYNTHROID) 125 MCG tablet; Take 1 tablet (125 mcg total) by mouth daily. -     nabumetone (RELAFEN) 500 MG tablet; Take 2 tablets (1,000 mg total) by mouth 2 (two) times daily. -     DULoxetine (CYMBALTA) 30 MG capsule; Take 1 capsule (30 mg total) by mouth 2 (two) times daily. Take with a full stomach at suppertime Take only one for the first week       I have changed Erin Ewing. Erin Ewing's levothyroxine. I am also having her start on DULoxetine. Additionally, I am having her maintain her aspirin, (Diphenhydramine-APAP, sleep, (TYLENOL PM EXTRA STRENGTH PO)), Calcium Carb-Cholecalciferol, amLODipine, olmesartan-hydrochlorothiazide, clopidogrel, and nabumetone.  Allergies as of 06/19/2019      Reactions     Fish Allergy Anaphylaxis   Peanuts [peanut Oil] Anaphylaxis   Celebrex [celecoxib] Diarrhea   Diclofenac Diarrhea      Medication List       Accurate as of June 19, 2019 11:59 PM. If you have any questions, ask your nurse or doctor.        amLODipine 5 MG tablet Commonly known as: NORVASC Take 1 tablet (5 mg total) by mouth daily.   aspirin 81 MG tablet Take 81 mg by mouth daily.   Calcium 600/Vitamin D3 600-800 MG-UNIT Tabs Generic drug: Calcium Carb-Cholecalciferol Take 1 tablet 2 (two) times daily by mouth.   clopidogrel 75 MG tablet Commonly known as: PLAVIX Take 1 tablet (75 mg total) by mouth daily.   DULoxetine 30 MG capsule Commonly known as: Cymbalta Take 1 capsule (30 mg total) by mouth 2 (two) times daily. Take with a full stomach at suppertime Take only one for the first week Started by: Mechele Claude, MD   levothyroxine 125 MCG tablet Commonly known as: SYNTHROID Take 1 tablet (125 mcg total) by mouth daily.   nabumetone 500 MG tablet Commonly known as: RELAFEN Take 2 tablets (1,000 mg total) by mouth 2 (two) times daily.   olmesartan-hydrochlorothiazide 40-12.5 MG tablet Commonly known as: BENICAR HCT Take 1 tablet by mouth daily.   TYLENOL PM EXTRA STRENGTH PO Take 1 tablet at bedtime as needed by mouth.        Follow-up: Return in about 6 weeks (around 07/31/2019).  Mechele Claude, M.D.

## 2019-06-23 ENCOUNTER — Other Ambulatory Visit: Payer: Self-pay | Admitting: Family Medicine

## 2019-06-23 DIAGNOSIS — I10 Essential (primary) hypertension: Secondary | ICD-10-CM

## 2019-06-24 ENCOUNTER — Other Ambulatory Visit: Payer: Self-pay | Admitting: Family Medicine

## 2019-06-24 ENCOUNTER — Other Ambulatory Visit: Payer: Self-pay | Admitting: *Deleted

## 2019-06-24 MED ORDER — DULOXETINE HCL 30 MG PO CPEP: ORAL_CAPSULE | ORAL | 0 refills | Status: DC

## 2019-06-24 MED ORDER — DULOXETINE HCL 30 MG PO CPEP: 30.0000 mg | ORAL_CAPSULE | Freq: Two times a day (BID) | ORAL | 0 refills | Status: DC

## 2019-06-24 NOTE — Addendum Note (Signed)
Addended by: Antonietta Barcelona D on: 06/24/2019 11:08 AM   Modules accepted: Orders

## 2019-06-24 NOTE — Telephone Encounter (Signed)
Please clarify correct & complete directions if titrrating or clarify current directions and correct quantity

## 2019-07-31 ENCOUNTER — Ambulatory Visit: Payer: Medicare HMO | Admitting: Family Medicine

## 2019-08-02 ENCOUNTER — Other Ambulatory Visit: Payer: Self-pay | Admitting: Family Medicine

## 2019-08-05 ENCOUNTER — Encounter: Payer: Self-pay | Admitting: Family Medicine

## 2019-08-05 ENCOUNTER — Ambulatory Visit (INDEPENDENT_AMBULATORY_CARE_PROVIDER_SITE_OTHER): Payer: Medicare HMO | Admitting: Family Medicine

## 2019-08-05 DIAGNOSIS — M170 Bilateral primary osteoarthritis of knee: Secondary | ICD-10-CM | POA: Diagnosis not present

## 2019-08-05 DIAGNOSIS — E039 Hypothyroidism, unspecified: Secondary | ICD-10-CM

## 2019-08-05 DIAGNOSIS — I10 Essential (primary) hypertension: Secondary | ICD-10-CM

## 2019-08-05 DIAGNOSIS — F330 Major depressive disorder, recurrent, mild: Secondary | ICD-10-CM

## 2019-08-05 DIAGNOSIS — M1990 Unspecified osteoarthritis, unspecified site: Secondary | ICD-10-CM

## 2019-08-05 MED ORDER — DULOXETINE HCL 60 MG PO CPEP: 60.0000 mg | ORAL_CAPSULE | Freq: Every day | ORAL | 1 refills | Status: DC

## 2019-08-05 MED ORDER — OLMESARTAN MEDOXOMIL-HCTZ 40-12.5 MG PO TABS: 1.0000 | ORAL_TABLET | Freq: Every day | ORAL | 1 refills | Status: DC

## 2019-08-05 MED ORDER — NABUMETONE 500 MG PO TABS: 1000.0000 mg | ORAL_TABLET | Freq: Two times a day (BID) | ORAL | 3 refills | Status: DC

## 2019-08-05 NOTE — Progress Notes (Signed)
Subjective:    Patient ID: Erin Ewing, female    DOB: 1936/05/07, 84 y.o.   MRN: [id number]   HPI: Erin Ewing is a 84 y.o. female presenting for  presents for  follow-up of hypertension. Patient has no history of headache chest pain or shortness of breath or recent cough. Patient also denies symptoms of TIA such as focal numbness or weakness. Patient denies side effects from medication. States taking it regularly.  follow-up on  thyroid. The patient has a history of hypothyroidism for many years. It has been stable recently. Pt. denies any change in  voice, loss of hair, heat or cold intolerance. Energy level has been adequate to good. Patient denies constipation and diarrhea. No myxedema. Medication is as noted below. Verified that pt is taking it daily on an empty stomach. Well tolerated.    Depression screen [facility] 2/9 06/19/2019 09/07/2018 09/19/2017 04/28/2017 10/05/2016  Decreased Interest 0 0 1 0 0  Down, Depressed, Hopeless 2 0 1 0 0  PHQ - 2 Score 2 0 2 0 0  Altered sleeping 2 - 3 - -  Tired, decreased energy 0 - 3 - -  Change in appetite 2 - 3 - -  Feeling bad or failure about yourself  0 - 0 - -  Trouble concentrating 2 - 3 - -  Moving slowly or fidgety/restless 0 - 0 - -  Suicidal thoughts 0 - 0 - -  PHQ-9 Score 8 - 14 - -  Difficult doing work/chores Not difficult at all - - - -     Relevant past medical, surgical, family and social history reviewed and updated as indicated.  Interim medical history since our last visit reviewed. Allergies and medications reviewed and updated.  ROS:  Review of Systems  Constitutional: Negative.   HENT: Negative for congestion.   Eyes: Negative for visual disturbance.  Respiratory: Negative for shortness of breath.   Cardiovascular: Negative for chest pain.  Gastrointestinal: Negative for abdominal pain, constipation, diarrhea, nausea and vomiting.  Genitourinary: Negative for difficulty urinating.  Musculoskeletal:  Positive for arthralgias. Negative for myalgias.  Neurological: Negative for headaches.  Psychiatric/Behavioral: Negative for sleep disturbance.     Social History   Tobacco Use  Smoking Status Current Every Day Smoker  . Types: Cigarettes  Smokeless Tobacco Never Used  Tobacco Comment   4-5 cigarettes per day       Objective:     Wt Readings from Last 3 Encounters:  06/19/19 189 lb (85.7 kg)  09/07/18 218 lb 2 oz (98.9 kg)  04/30/18 220 lb (99.8 kg)     Exam deferred. Pt. Harboring due to COVID 19. Phone visit performed.   Assessment & Plan:   1. Hypothyroidism, unspecified type   2. Essential hypertension   3. Mild episode of recurrent major depressive disorder ([place])   4. Arthritis of both knees   5. Arthritis     Meds ordered this encounter  Medications  . nabumetone (RELAFEN) 500 MG tablet    Sig: Take 2 tablets (1,000 mg total) by mouth 2 (two) times daily.    Dispense:  360 tablet    Refill:  3  . DULoxetine (CYMBALTA) 60 MG capsule    Sig: Take 1 capsule (60 mg total) by mouth daily. Take one daily for one week, then take two daily.    Dispense:  90 capsule    Refill:  1  . olmesartan-hydrochlorothiazide (BENICAR HCT) 40-12.5 MG tablet  Sig: Take 1 tablet by mouth daily.    Dispense:  90 tablet    Refill:  1    No orders of the defined types were placed in this encounter.     Diagnoses and all orders for this visit:  Hypothyroidism, unspecified type  Essential hypertension -     olmesartan-hydrochlorothiazide (BENICAR HCT) 40-12.5 MG tablet; Take 1 tablet by mouth daily.  Mild episode of recurrent major depressive disorder (HCC) -     DULoxetine (CYMBALTA) 60 MG capsule; Take 1 capsule (60 mg total) by mouth daily. Take one daily for one week, then take two daily.  Arthritis of both knees -     nabumetone (RELAFEN) 500 MG tablet; Take 2 tablets (1,000 mg total) by mouth 2 (two) times daily.  Arthritis  Patient's meds were reviewed  showing that she has adequate supply of her thyroid medication.  She tells me she stopped taking the amlodipine because it made her dizzy.  Her blood pressure has not been checked recently because she gave her monitor to her daughter Herbert Seta.  She is can asked Herbert Seta to bring it back over to check her.  Obviously I do not want her to be dizzy but we need to make sure that olmesartan HCT is doing the trick for the blood pressure.  Of note is that her depression seems to be much better on conversation formal PHQ-9 was not performed but in conversation her symptoms do seem much diminished.  She is taking the duloxetine 60 mg a day as to 30 mg tablets I am going to go ahead and upgrade that to a 60 mg capsule daily.  That should simplify her regimen somewhat as well.  She continues to take Plavix and have no side effects with regard to bleeding.  Virtual Visit via telephone Note  I discussed the limitations, risks, security and privacy concerns of performing an evaluation and management service by telephone and the availability of in person appointments. The patient was identified with two identifiers. Pt.expressed understanding and agreed to proceed. Pt. Is at home. Dr. Darlyn Read is in his office.  Follow Up Instructions:   I discussed the assessment and treatment plan with the patient. The patient was provided an opportunity to ask questions and all were answered. The patient agreed with the plan and demonstrated an understanding of the instructions.   The patient was advised to call back or seek an in-person evaluation if the symptoms worsen or if the condition fails to improve as anticipated.   Total minutes including chart review and phone contact time: 29   Follow up plan: Return in about 5 months (around 01/03/2020), or if symptoms worsen or fail to improve, for Hypothyroidism, hypertension.  Mechele Claude, MD [place] [facility]

## 2019-08-12 ENCOUNTER — Other Ambulatory Visit: Payer: Self-pay | Admitting: Family Medicine

## 2019-08-12 ENCOUNTER — Telehealth: Payer: Self-pay | Admitting: *Deleted

## 2019-08-12 DIAGNOSIS — F330 Major depressive disorder, recurrent, mild: Secondary | ICD-10-CM

## 2019-08-12 MED ORDER — DULOXETINE HCL 60 MG PO CPEP: 60.0000 mg | ORAL_CAPSULE | Freq: Every day | ORAL | 1 refills | Status: DC

## 2019-08-12 NOTE — Telephone Encounter (Signed)
Fax from [facility] Deloxetine HCL 60 mg 1 cap QD, take 1 daily for a week, then take two daily Conflicting directions Please provider the intended directions

## 2019-08-12 NOTE — Telephone Encounter (Signed)
I sent in the requested prescription 

## 2019-09-22 ENCOUNTER — Other Ambulatory Visit: Payer: Self-pay | Admitting: Family Medicine

## 2019-09-22 DIAGNOSIS — I10 Essential (primary) hypertension: Secondary | ICD-10-CM

## 2019-12-16 ENCOUNTER — Other Ambulatory Visit: Payer: Self-pay | Admitting: Family Medicine

## 2020-03-12 ENCOUNTER — Encounter: Payer: Self-pay | Admitting: Family Medicine

## 2020-03-12 ENCOUNTER — Other Ambulatory Visit: Payer: Self-pay

## 2020-03-12 ENCOUNTER — Ambulatory Visit: Payer: Medicare HMO | Admitting: Family Medicine

## 2020-03-12 VITALS — BP 122/91 | HR 92 | Temp 97.6°F | Resp 20 | Ht 67.0 in | Wt 183.0 lb

## 2020-03-12 DIAGNOSIS — F331 Major depressive disorder, recurrent, moderate: Secondary | ICD-10-CM

## 2020-03-12 DIAGNOSIS — R197 Diarrhea, unspecified: Secondary | ICD-10-CM | POA: Diagnosis not present

## 2020-03-12 DIAGNOSIS — M1990 Unspecified osteoarthritis, unspecified site: Secondary | ICD-10-CM | POA: Diagnosis not present

## 2020-03-12 DIAGNOSIS — E039 Hypothyroidism, unspecified: Secondary | ICD-10-CM | POA: Diagnosis not present

## 2020-03-12 DIAGNOSIS — I10 Essential (primary) hypertension: Secondary | ICD-10-CM

## 2020-03-12 MED ORDER — DULOXETINE HCL 60 MG PO CPEP: 60.0000 mg | ORAL_CAPSULE | Freq: Every day | ORAL | 1 refills | Status: DC

## 2020-03-12 MED ORDER — GABAPENTIN (ONCE-DAILY) 300 MG PO TABS: 300.0000 mg | ORAL_TABLET | Freq: Every day | ORAL | 1 refills | Status: DC

## 2020-03-12 MED ORDER — OLMESARTAN MEDOXOMIL-HCTZ 40-12.5 MG PO TABS: 1.0000 | ORAL_TABLET | Freq: Every day | ORAL | 1 refills | Status: DC

## 2020-03-12 MED ORDER — COLESTIPOL HCL 5 G PO GRAN: 5.0000 g | GRANULES | Freq: Two times a day (BID) | ORAL | 12 refills | Status: DC

## 2020-03-12 NOTE — Progress Notes (Signed)
Subjective:  Patient ID: Erin Ewing, female    DOB: 1935-08-13  Age: 84 y.o. MRN: [id number]  CC: No chief complaint on file.   HPI Erin Ewing presents for Soiling herself multiple times daily. The niece and her husband have visited the home and discovered that she has feces smeared on the floor in multiple places throughout the home. Erin Ewing, her niece is here with her today. They think this problem has been going on for about a year.  Patient presents for follow-up on  thyroid. The patient has a history of hypothyroidism for many years. It has been stable recently. Pt. denies any change in  voice, loss of hair, heat or cold intolerance. Energy level has been adequate to good. Patient denies constipation and diarrhea. No myxedema. Medication is as noted below. Verified that pt is taking it daily on an empty stomach. Well tolerated.  Patient also has multiple joint pains. Nabumetone gives her plus or minus relief. Patient is terrified of falling and hitting her head and bleeding to death and dying. This is depressing for her.  Depression screen [facility] 2/9 03/12/2020 06/19/2019 09/07/2018  Decreased Interest 0 0 0  Down, Depressed, Hopeless 3 2 0  PHQ - 2 Score 3 2 0  Altered sleeping 0 2 -  Tired, decreased energy 3 0 -  Change in appetite 0 2 -  Feeling bad or failure about yourself  3 0 -  Trouble concentrating 3 2 -  Moving slowly or fidgety/restless 0 0 -  Suicidal thoughts 0 0 -  PHQ-9 Score 12 8 -  Difficult doing work/chores - Not difficult at all -    History Erin Ewing has a past medical history of Arthritis, Hypertension, Obesity, Polycythemia, Reflex sympathetic dystrophy (08/16/2012), and Subclinical hypothyroidism.   She has a past surgical history that includes ABDOMINAL AORTOGRAM W/LOWER EXTREMITY (N/A, 05/30/2017); PERIPHERAL VASCULAR INTERVENTION (Right, 05/30/2017); and PERIPHERAL VASCULAR ATHERECTOMY (Left, 05/30/2017).   Her family history includes Heart  disease in her father.She reports that she has been smoking cigarettes. She has never used smokeless tobacco. She reports that she does not drink alcohol and does not use drugs.    ROS Review of Systems  Constitutional: Negative.   HENT: Negative for congestion.   Eyes: Negative for visual disturbance.  Respiratory: Negative for shortness of breath.   Cardiovascular: Negative for chest pain.  Gastrointestinal: Positive for diarrhea (With incontinence of stool.). Negative for abdominal pain, constipation, nausea and vomiting.  Genitourinary: Negative for difficulty urinating.  Musculoskeletal: Negative for arthralgias and myalgias.  Neurological: Negative for headaches.  Psychiatric/Behavioral: Negative for sleep disturbance.    Objective:  BP (!) 122/91   Pulse 92   Temp 97.6 F (36.4 C) (Temporal)   Resp 20   Ht 5' 7" (1.702 m)   Wt 183 lb (83 kg)   SpO2 98%   BMI 28.66 kg/m   BP Readings from Last 3 Encounters:  03/12/20 (!) 122/91  06/19/19 108/77  09/07/18 (!) 146/77    Wt Readings from Last 3 Encounters:  03/12/20 183 lb (83 kg)  06/19/19 189 lb (85.7 kg)  09/07/18 218 lb 2 oz (98.9 kg)     Physical Exam Constitutional:      General: She is not in acute distress.    Appearance: She is well-developed.  HENT:     Head: Normocephalic and atraumatic.     Right Ear: External ear normal.     Left Ear: External ear normal.  Nose: Nose normal.  Eyes:     Conjunctiva/sclera: Conjunctivae normal.     Pupils: Pupils are equal, round, and reactive to light.  Neck:     Thyroid: No thyromegaly.  Cardiovascular:     Rate and Rhythm: Normal rate and regular rhythm.     Heart sounds: Normal heart sounds. No murmur heard.   Pulmonary:     Effort: Pulmonary effort is normal. No respiratory distress.     Breath sounds: Normal breath sounds. No wheezing or rales.  Abdominal:     General: Bowel sounds are normal. There is no distension.     Palpations: Abdomen is  soft.     Tenderness: There is no abdominal tenderness.  Musculoskeletal:        General: Normal range of motion.     Cervical back: Normal range of motion and neck supple.  Lymphadenopathy:     Cervical: No cervical adenopathy.  Skin:    General: Skin is warm and dry.  Neurological:     Mental Status: She is alert and oriented to person, place, and time.     Deep Tendon Reflexes: Reflexes are normal and symmetric.  Psychiatric:        Behavior: Behavior normal.        Thought Content: Thought content normal.        Judgment: Judgment normal.       Assessment & Plan:   Diagnoses and all orders for this visit:  Diarrhea, unspecified type -     CBC with Differential/Platelet -     CMP14+EGFR -     Cdiff NAA+O+P+Stool Culture -     Giardia/Cryptosporidium EIA  Hypothyroidism, unspecified type -     CBC with Differential/Platelet -     CMP14+EGFR -     TSH + free T4  Arthritis -     CBC with Differential/Platelet -     CMP14+EGFR  Moderate episode of recurrent major depressive disorder (HCC) -     DULoxetine (CYMBALTA) 60 MG capsule; Take 1 capsule (60 mg total) by mouth daily.  Essential hypertension -     olmesartan-hydrochlorothiazide (BENICAR HCT) 40-12.5 MG tablet; Take 1 tablet by mouth daily.  Other orders -     Gabapentin, Once-Daily, 300 MG TABS; Take 300 mg by mouth at bedtime. -     colestipol (COLESTID) 5 g granules; Take 5 g by mouth 2 (two) times daily.       I have discontinued Ed Rayson. Goates's nabumetone. I am also having her start on Gabapentin (Once-Daily) and colestipol. Additionally, I am having her maintain her aspirin, (Diphenhydramine-APAP, sleep, (TYLENOL PM EXTRA STRENGTH PO)), Calcium Carb-Cholecalciferol, clopidogrel, levothyroxine, DULoxetine, and olmesartan-hydrochlorothiazide.  Allergies as of 03/12/2020      Reactions   Fish Allergy Anaphylaxis   Peanuts [peanut Oil] Anaphylaxis   Celebrex [celecoxib] Diarrhea   Diclofenac  Diarrhea      Medication List       Accurate as of March 12, 2020 11:59 PM. If you have any questions, ask your nurse or doctor.        STOP taking these medications   nabumetone 500 MG tablet Commonly known as: RELAFEN Stopped by: Claretta Fraise, MD     TAKE these medications   aspirin 81 MG tablet Take 81 mg by mouth daily.   Calcium 600/Vitamin D3 600-800 MG-UNIT Tabs Generic drug: Calcium Carb-Cholecalciferol Take 1 tablet 2 (two) times daily by mouth.   clopidogrel 75 MG tablet Commonly known as: PLAVIX  Take 1 tablet (75 mg total) by mouth daily.   colestipol 5 g granules Commonly known as: COLESTID Take 5 g by mouth 2 (two) times daily. Started by: Claretta Fraise, MD   DULoxetine 60 MG capsule Commonly known as: Cymbalta Take 1 capsule (60 mg total) by mouth daily.   Gabapentin (Once-Daily) 300 MG Tabs Take 300 mg by mouth at bedtime. Started by: Claretta Fraise, MD   levothyroxine 125 MCG tablet Commonly known as: SYNTHROID TAKE 1 TABLET DAILY   olmesartan-hydrochlorothiazide 40-12.5 MG tablet Commonly known as: BENICAR HCT Take 1 tablet by mouth daily.   TYLENOL PM EXTRA STRENGTH PO Take 1 tablet at bedtime as needed by mouth.        Follow-up: No follow-ups on file.  Claretta Fraise, M.D.

## 2020-03-13 LAB — CMP14+EGFR
ALT: 12 IU/L (ref 0–32)
AST: 12 IU/L (ref 0–40)
Albumin/Globulin Ratio: 1.7 (ref 1.2–2.2)
Albumin: 4.5 g/dL (ref 3.6–4.6)
Alkaline Phosphatase: 87 IU/L (ref 48–121)
BUN/Creatinine Ratio: 18 (ref 12–28)
BUN: 19 mg/dL (ref 8–27)
Bilirubin Total: 0.5 mg/dL (ref 0.0–1.2)
CO2: 22 mmol/L (ref 20–29)
Calcium: 10.6 mg/dL — ABNORMAL HIGH (ref 8.7–10.3)
Chloride: 98 mmol/L (ref 96–106)
Creatinine, Ser: 1.08 mg/dL — ABNORMAL HIGH (ref 0.57–1.00)
GFR calc Af Amer: 56 mL/min/{1.73_m2} — ABNORMAL LOW (ref >59)
GFR calc non Af Amer: 48 mL/min/{1.73_m2} — ABNORMAL LOW (ref >59)
Globulin, Total: 2.6 g/dL (ref 1.5–4.5)
Glucose: 102 mg/dL — ABNORMAL HIGH (ref 65–99)
Potassium: 4 mmol/L (ref 3.5–5.2)
Sodium: 137 mmol/L (ref 134–144)
Total Protein: 7.1 g/dL (ref 6.0–8.5)

## 2020-03-13 LAB — CBC WITH DIFFERENTIAL/PLATELET
Basophils Absolute: 0 10*3/uL (ref 0.0–0.2)
Basos: 0 %
EOS (ABSOLUTE): 0.1 10*3/uL (ref 0.0–0.4)
Eos: 1 %
Hematocrit: 51.4 % — ABNORMAL HIGH (ref 34.0–46.6)
Hemoglobin: 17.2 g/dL — ABNORMAL HIGH (ref 11.1–15.9)
Immature Grans (Abs): 0 10*3/uL (ref 0.0–0.1)
Immature Granulocytes: 0 %
Lymphocytes Absolute: 1.6 10*3/uL (ref 0.7–3.1)
Lymphs: 15 %
MCH: 31.2 pg (ref 26.6–33.0)
MCHC: 33.5 g/dL (ref 31.5–35.7)
MCV: 93 fL (ref 79–97)
Monocytes Absolute: 0.6 10*3/uL (ref 0.1–0.9)
Monocytes: 6 %
Neutrophils Absolute: 8.1 10*3/uL — ABNORMAL HIGH (ref 1.4–7.0)
Neutrophils: 78 %
Platelets: 336 10*3/uL (ref 150–450)
RBC: 5.51 x10E6/uL — ABNORMAL HIGH (ref 3.77–5.28)
RDW: 13.7 % (ref 11.7–15.4)
WBC: 10.5 10*3/uL (ref 3.4–10.8)

## 2020-03-13 LAB — TSH+FREE T4
Free T4: 1.65 ng/dL (ref 0.82–1.77)
TSH: 3.77 u[IU]/mL (ref 0.450–4.500)

## 2020-03-15 ENCOUNTER — Encounter: Payer: Self-pay | Admitting: Family Medicine

## 2020-03-15 NOTE — Progress Notes (Signed)
Hello Erin Ewing, ? ?Your lab result is normal and/or stable.Some minor variations that are not significant are commonly marked abnormal, but do not represent any medical problem for you. ? ?Best regards, ?Hai Grabe, M.D.

## 2020-03-23 ENCOUNTER — Telehealth: Payer: Self-pay | Admitting: Family Medicine

## 2020-03-23 ENCOUNTER — Other Ambulatory Visit: Payer: Self-pay | Admitting: Family Medicine

## 2020-03-23 MED ORDER — GABAPENTIN 300 MG PO CAPS: 300.0000 mg | ORAL_CAPSULE | Freq: Every day | ORAL | 1 refills | Status: DC

## 2020-03-23 NOTE — Telephone Encounter (Signed)
Fixed med problem, Brand name gabapentin ER was ordered, it needed to be not ER.Dr Darlyn Read reordered

## 2020-03-25 ENCOUNTER — Telehealth: Payer: Self-pay | Admitting: Family Medicine

## 2020-03-25 NOTE — Telephone Encounter (Signed)
Are you willing to speak with Herbert Seta?

## 2020-03-27 ENCOUNTER — Other Ambulatory Visit: Payer: Self-pay | Admitting: Family Medicine

## 2020-03-27 DIAGNOSIS — R197 Diarrhea, unspecified: Secondary | ICD-10-CM

## 2020-03-27 DIAGNOSIS — R159 Full incontinence of feces: Secondary | ICD-10-CM

## 2020-03-27 NOTE — Telephone Encounter (Signed)
I discussed Erin Ewing's case with her niece Herbert Seta. She is the closest relative. Apparently she is still having some fecal incontinence and diarrhea  despite dropping the nabumetone and starting the Colestid. She states she is doing just fine when she speaks to [place], but the house she lives in is filthy and liver he has feces scattered around on the floor etc. I am going to go ahead with a referral to GI. But when she comes back in I also want to evaluate her for dementia

## 2020-04-02 ENCOUNTER — Encounter: Payer: Self-pay | Admitting: Family Medicine

## 2020-04-02 ENCOUNTER — Encounter: Payer: Self-pay | Admitting: Internal Medicine

## 2020-04-02 ENCOUNTER — Other Ambulatory Visit: Payer: Self-pay

## 2020-04-02 ENCOUNTER — Ambulatory Visit (INDEPENDENT_AMBULATORY_CARE_PROVIDER_SITE_OTHER): Payer: Medicare HMO | Admitting: Family Medicine

## 2020-04-02 VITALS — BP 131/89 | HR 76 | Temp 97.1°F | Resp 20 | Ht 67.0 in | Wt 179.0 lb

## 2020-04-02 DIAGNOSIS — R197 Diarrhea, unspecified: Secondary | ICD-10-CM

## 2020-04-02 DIAGNOSIS — Z23 Encounter for immunization: Secondary | ICD-10-CM

## 2020-04-03 ENCOUNTER — Encounter: Payer: Self-pay | Admitting: Family Medicine

## 2020-04-03 NOTE — Progress Notes (Signed)
Subjective:  Patient ID: Erin Ewing, female    DOB: 04-21-1936  Age: 84 y.o. MRN: [id number]  CC: No chief complaint on file.   HPI [name] presents for follow-up on her diarrhea.  Since she stopped the nabumetone it has resolved.  Today her history is supplemented by her sister-in-law who is attending with her.  Phone messages received from Erin Ewing indicates that the diarrhea has not resolved as of about a week ago.  Her sister-in-law says that it has as does Erin Ewing.  However the sister-in-law relates that the house still has a great deal of feces scattered around the home from previous diarrhea to having a biohazard company cleaning the home.  Is good states that she did not start the Colestid.  She did not understand it.  She says she will start it if I want her to.  However since the diarrhea has resolved she wonders if she needs it.  He is having bowel movements less frequently.  She seems to have better control overall.  He has attempted to collect a stool specimen for the previously ordered studies.  However they were not collected properly at the lab declined them.  Again since the symptoms has resolved she wonders if there is still in need.  She also states that her pain has not resolved.  The gabapentin only gives partial relief.  Unfortunately her diarrhea symptoms started when she started taking the nabumetone and resolved when she quit taking the medicine    Depression screen [facility] 2/9 04/02/2020 03/12/2020 06/19/2019  Decreased Interest 0 0 0  Down, Depressed, Hopeless 0 3 2  PHQ - 2 Score 0 3 2  Altered sleeping - 0 2  Tired, decreased energy - 3 0  Change in appetite - 0 2  Feeling bad or failure about yourself  - 3 0  Trouble concentrating - 3 2  Moving slowly or fidgety/restless - 0 0  Suicidal thoughts - 0 0  PHQ-9 Score - 12 8  Difficult doing work/chores - - Not difficult at all    History Matti has a past medical history of Arthritis, Hypertension,  Obesity, Polycythemia, Reflex sympathetic dystrophy (08/16/2012), and Subclinical hypothyroidism.   She has a past surgical history that includes ABDOMINAL AORTOGRAM W/LOWER EXTREMITY (N/A, 05/30/2017); PERIPHERAL VASCULAR INTERVENTION (Right, 05/30/2017); and PERIPHERAL VASCULAR ATHERECTOMY (Left, 05/30/2017).   Her family history includes Heart disease in her father.She reports that she has been smoking cigarettes. She has never used smokeless tobacco. She reports that she does not drink alcohol and does not use drugs.    ROS Review of Systems  Constitutional: Negative.   HENT: Negative.   Eyes: Negative for visual disturbance.  Respiratory: Negative for shortness of breath.   Cardiovascular: Negative for chest pain.  Gastrointestinal: Positive for constipation. Negative for abdominal pain.  Musculoskeletal: Positive for arthralgias.    Objective:  BP 131/89   Pulse 76   Temp (!) 97.1 F (36.2 C) (Temporal)   Resp 20   Ht 5\' 7"  (1.702 m)   Wt 179 lb (81.2 kg)   SpO2 95%   BMI 28.04 kg/m   BP Readings from Last 3 Encounters:  04/02/20 131/89  03/12/20 (!) 122/91  06/19/19 108/77    Wt Readings from Last 3 Encounters:  04/02/20 179 lb (81.2 kg)  03/12/20 183 lb (83 kg)  06/19/19 189 lb (85.7 kg)     Physical Exam Constitutional:      General: She is not  in acute distress.    Appearance: She is well-developed.  Cardiovascular:     Rate and Rhythm: Normal rate and regular rhythm.  Pulmonary:     Breath sounds: Normal breath sounds.  Abdominal:     Palpations: Abdomen is soft. There is no mass.  Skin:    General: Skin is warm and dry.  Neurological:     Mental Status: She is alert and oriented to person, place, and time.       Assessment & Plan:   Diagnoses and all orders for this visit:  Need for immunization against influenza -     Flu Vaccine QUAD High Dose(Fluad)  Diarrhea, unspecified type       I am having Erin Ewing maintain her  aspirin, (Diphenhydramine-APAP, sleep, (TYLENOL PM EXTRA STRENGTH PO)), Calcium Carb-Cholecalciferol, clopidogrel, levothyroxine, Gabapentin (Once-Daily), colestipol, DULoxetine, olmesartan-hydrochlorothiazide, and gabapentin.  Allergies as of 04/02/2020      Reactions   Fish Allergy Anaphylaxis   Peanuts [peanut Oil] Anaphylaxis   Celebrex [celecoxib] Diarrhea   Diclofenac Diarrhea      Medication List       Accurate as of April 02, 2020 11:59 PM. If you have any questions, ask your nurse or doctor.        aspirin 81 MG tablet Take 81 mg by mouth daily.   Calcium 600/Vitamin D3 600-800 MG-UNIT Tabs Generic drug: Calcium Carb-Cholecalciferol Take 1 tablet 2 (two) times daily by mouth.   clopidogrel 75 MG tablet Commonly known as: PLAVIX Take 1 tablet (75 mg total) by mouth daily.   colestipol 5 g granules Commonly known as: COLESTID Take 5 g by mouth 2 (two) times daily.   DULoxetine 60 MG capsule Commonly known as: Cymbalta Take 1 capsule (60 mg total) by mouth daily.   Gabapentin (Once-Daily) 300 MG Tabs Take 300 mg by mouth at bedtime.   gabapentin 300 MG capsule Commonly known as: NEURONTIN Take 1 capsule (300 mg total) by mouth at bedtime.   levothyroxine 125 MCG tablet Commonly known as: SYNTHROID TAKE 1 TABLET DAILY   olmesartan-hydrochlorothiazide 40-12.5 MG tablet Commonly known as: BENICAR HCT Take 1 tablet by mouth daily.   TYLENOL PM EXTRA STRENGTH PO Take 1 tablet at bedtime as needed by mouth.        Follow-up: No follow-ups on file.  Mechele Claude, M.D.

## 2020-04-07 ENCOUNTER — Telehealth: Payer: Self-pay | Admitting: Family Medicine

## 2020-04-07 NOTE — Telephone Encounter (Signed)
°  Prescription Request  04/07/2020  What is the name of the medication or equipment? Gabapentin & Sifenacin  Have you contacted your pharmacy to request a refill? (if applicable) Yes  Which pharmacy would you like this sent to? [facility]   Patient notified that their request is being sent to the clinical staff for review and that they should receive a response within 2 business days.   Stacks' pt.  She doesn't want to be out of her meds for 3 weeks this time.  Please call pt.  She seen him on Sept. 23

## 2020-04-07 NOTE — Telephone Encounter (Signed)
LMOVM with name of medications that were sent to [facility] on 03/12/20 with 3 mos supply with a refill, if there was one that I mentioned that was one that she needed refilled to call the office back

## 2020-04-08 NOTE — Telephone Encounter (Signed)
rc for [facility] please call back

## 2020-04-08 NOTE — Telephone Encounter (Signed)
Pt states at last visit it was discussed that she could add one during the day. Also a prescription for incontinence was discussed but not sent into [facility] Please advise

## 2020-04-08 NOTE — Telephone Encounter (Signed)
lmtcb

## 2020-04-13 ENCOUNTER — Other Ambulatory Visit: Payer: Self-pay | Admitting: Family Medicine

## 2020-04-13 MED ORDER — GABAPENTIN 300 MG PO CAPS
300.0000 mg | ORAL_CAPSULE | Freq: Two times a day (BID) | ORAL | 1 refills | Status: DC
Start: 2020-04-13 — End: 2020-08-05

## 2020-04-13 MED ORDER — TOLTERODINE TARTRATE ER 2 MG PO CP24: 2.0000 mg | ORAL_CAPSULE | Freq: Every day | ORAL | 1 refills | Status: DC

## 2020-04-13 NOTE — Telephone Encounter (Signed)
Patient aware.

## 2020-04-13 NOTE — Telephone Encounter (Signed)
Done. Thanks, WS 

## 2020-04-27 ENCOUNTER — Ambulatory Visit (INDEPENDENT_AMBULATORY_CARE_PROVIDER_SITE_OTHER): Payer: Medicare HMO | Admitting: *Deleted

## 2020-04-27 ENCOUNTER — Other Ambulatory Visit: Payer: Self-pay

## 2020-04-27 DIAGNOSIS — Z111 Encounter for screening for respiratory tuberculosis: Secondary | ICD-10-CM

## 2020-04-27 NOTE — Progress Notes (Signed)
PATIENT IN TODAY FOR PPD PLACEMENT. PLACED IN LEFT FOREARM, WILL RETURN IN 72 HOURS FOR READING.

## 2020-04-30 ENCOUNTER — Ambulatory Visit: Payer: Medicare HMO

## 2020-05-04 ENCOUNTER — Telehealth: Payer: Self-pay

## 2020-05-04 NOTE — Telephone Encounter (Signed)
  Prescription Request  05/04/2020  What is the name of the medication or equipment? Pt needs a walker   Have you contacted your pharmacy to request a refill? (if applicable) no  Which pharmacy would you like this sent to? Not sure   Patient notified that their request is being sent to the clinical staff for review and that they should receive a response within 2 business days.

## 2020-05-15 ENCOUNTER — Ambulatory Visit: Payer: Self-pay | Admitting: Gastroenterology

## 2020-07-06 ENCOUNTER — Ambulatory Visit: Payer: Medicare HMO | Admitting: Family Medicine

## 2020-07-27 ENCOUNTER — Other Ambulatory Visit: Payer: Self-pay

## 2020-07-27 ENCOUNTER — Encounter: Payer: Self-pay | Admitting: Family Medicine

## 2020-07-27 ENCOUNTER — Encounter: Payer: Medicare HMO | Admitting: Family Medicine

## 2020-07-27 NOTE — Progress Notes (Signed)
No answer. No voicemail X 3 WS

## 2020-07-30 ENCOUNTER — Telehealth: Payer: Self-pay

## 2020-08-05 ENCOUNTER — Encounter: Payer: Self-pay | Admitting: Family Medicine

## 2020-08-05 ENCOUNTER — Telehealth: Payer: Self-pay

## 2020-08-05 ENCOUNTER — Other Ambulatory Visit: Payer: Self-pay

## 2020-08-05 ENCOUNTER — Ambulatory Visit (INDEPENDENT_AMBULATORY_CARE_PROVIDER_SITE_OTHER): Payer: Medicare HMO | Admitting: Family Medicine

## 2020-08-05 VITALS — BP 124/83 | HR 88 | Temp 98.1°F | Ht 67.0 in | Wt 185.4 lb

## 2020-08-05 DIAGNOSIS — I10 Essential (primary) hypertension: Secondary | ICD-10-CM

## 2020-08-05 DIAGNOSIS — J208 Acute bronchitis due to other specified organisms: Secondary | ICD-10-CM

## 2020-08-05 DIAGNOSIS — U071 COVID-19: Secondary | ICD-10-CM | POA: Diagnosis not present

## 2020-08-05 DIAGNOSIS — M1990 Unspecified osteoarthritis, unspecified site: Secondary | ICD-10-CM

## 2020-08-05 DIAGNOSIS — E039 Hypothyroidism, unspecified: Secondary | ICD-10-CM | POA: Diagnosis not present

## 2020-08-05 DIAGNOSIS — F331 Major depressive disorder, recurrent, moderate: Secondary | ICD-10-CM

## 2020-08-05 MED ORDER — CLOPIDOGREL BISULFATE 75 MG PO TABS: 75.0000 mg | ORAL_TABLET | Freq: Every day | ORAL | 1 refills | Status: DC

## 2020-08-05 MED ORDER — OLMESARTAN MEDOXOMIL-HCTZ 40-12.5 MG PO TABS: 1.0000 | ORAL_TABLET | Freq: Every day | ORAL | 1 refills | Status: DC

## 2020-08-05 MED ORDER — GABAPENTIN 300 MG PO CAPS: 300.0000 mg | ORAL_CAPSULE | Freq: Two times a day (BID) | ORAL | 1 refills | Status: DC

## 2020-08-05 MED ORDER — LEVOTHYROXINE SODIUM 125 MCG PO TABS: 125.0000 ug | ORAL_TABLET | Freq: Every day | ORAL | 1 refills | Status: DC

## 2020-08-05 MED ORDER — DEXAMETHASONE 6 MG PO TABS: 6.0000 mg | ORAL_TABLET | Freq: Two times a day (BID) | ORAL | 0 refills | Status: DC

## 2020-08-05 MED ORDER — DULOXETINE HCL 60 MG PO CPEP: 60.0000 mg | ORAL_CAPSULE | Freq: Every day | ORAL | 1 refills | Status: DC

## 2020-08-05 MED ORDER — TOLTERODINE TARTRATE ER 2 MG PO CP24
2.0000 mg | ORAL_CAPSULE | Freq: Every day | ORAL | 1 refills | Status: DC
Start: 2020-08-05 — End: 2020-08-05

## 2020-08-05 NOTE — Telephone Encounter (Signed)
Please ask to talk to Nettie Elm or Angel--needs to know if Dr Darlyn Read d/c rx tolterodine (DETROL LA) 2 MG 24 hr capsule. On the ppw it says to d/c but it was called in to pharmacy. Please clarify.

## 2020-08-05 NOTE — Progress Notes (Signed)
Subjective:  Patient ID: Erin Ewing, female    DOB: Dec 28, 1935  Age: 85 y.o. MRN: [id number]  CC: Medical Management of Chronic Issues   HPI Erin Ewing presents for patient presents for follow-up on  thyroid. The patient has a history of hypothyroidism for many years. It has been stable recently. Pt. denies any change in  voice, loss of hair, heat or cold intolerance. Energy level has been adequate to good. Patient denies constipation and diarrhea. No myxedema. Medication is as noted below. Verified that pt is taking it daily on an empty stomach. Well tolerated.   Follow-up of hypertension. Patient has no history of headache chest pain or shortness of breath or recent cough. Patient also denies symptoms of TIA such as numbness weakness lateralizing. Patient checks  blood pressure at home and has not had any elevated readings recently. Patient denies side effects from his medication. States taking it regularly.  Patient was diagnosed last week with Covid.  Her attendant from the nursing facility where she is living in [place] says that she has been asymptomatic for over 5 days.  However, Erin Ewing says that she is still congested.  She feels like she is wheezing and her chest is tight.  She denies shortness of breath.  She is comfortable.  She has no significant cough.  Erin Ewing says that the diarrhea and fecal incontinence problems have resolved.  Therefore she would like to discontinue the colestipol and the tolterodine.  The attendant from the nursing facility tells me that she does have some occasional urinary urgency and incontinence but this can be managed with pads adequately.  Erin Ewing tells me she prefers that option.   Depression screen Erin Ewing 2/9 08/05/2020 04/02/2020 03/12/2020  Decreased Interest 0 0 0  Down, Depressed, Hopeless 0 0 3  PHQ - 2 Score 0 0 3  Altered sleeping - - 0  Tired, decreased energy - - 3  Change in appetite - - 0  Feeling bad or failure about  yourself  - - 3  Trouble concentrating - - 3  Moving slowly or fidgety/restless - - 0  Suicidal thoughts - - 0  PHQ-9 Score - - 12  Difficult doing work/chores - - -    History Erin Ewing has a past medical history of Arthritis, Hypertension, Obesity, Polycythemia, Reflex sympathetic dystrophy (08/16/2012), and Subclinical hypothyroidism.   She has a past surgical history that includes ABDOMINAL AORTOGRAM W/LOWER EXTREMITY (N/A, 05/30/2017); PERIPHERAL VASCULAR INTERVENTION (Right, 05/30/2017); and PERIPHERAL VASCULAR ATHERECTOMY (Left, 05/30/2017).   Her family history includes Heart disease in her father.She reports that she has been smoking cigarettes. She has never used smokeless tobacco. She reports that she does not drink alcohol and does not use drugs.    ROS Review of Systems  Constitutional: Negative.   HENT: Negative for congestion.   Eyes: Negative for visual disturbance.  Respiratory: Positive for chest tightness and wheezing. Negative for shortness of breath.   Cardiovascular: Negative for chest pain.  Gastrointestinal: Negative for abdominal pain, constipation, diarrhea, nausea and vomiting.  Genitourinary: Negative for difficulty urinating.  Musculoskeletal: Negative for arthralgias and myalgias.  Neurological: Negative for headaches.  Psychiatric/Behavioral: Negative for sleep disturbance.    Objective:  BP 124/83   Pulse 88   Temp 98.1 F (36.7 C) (Temporal)   Ht '5\' 7"'  (1.702 m)   Wt 185 lb 6.4 oz (84.1 kg)   BMI 29.04 kg/m   BP Readings from Last 3 Encounters:  08/05/20 124/83  04/02/20 131/89  03/12/20 (!) 122/91    Wt Readings from Last 3 Encounters:  08/05/20 185 lb 6.4 oz (84.1 kg)  04/02/20 179 lb (81.2 kg)  03/12/20 183 lb (83 kg)     Physical Exam Constitutional:      General: She is not in acute distress.    Appearance: She is well-developed.  HENT:     Head: Normocephalic and atraumatic.  Eyes:     Conjunctiva/sclera: Conjunctivae  normal.     Pupils: Pupils are equal, round, and reactive to light.  Neck:     Thyroid: No thyromegaly.  Cardiovascular:     Rate and Rhythm: Normal rate and regular rhythm.     Heart sounds: Normal heart sounds. No murmur heard.   Pulmonary:     Effort: Pulmonary effort is normal. No respiratory distress.     Breath sounds: Wheezing and rhonchi present. No rales.  Abdominal:     General: Bowel sounds are normal. There is no distension.     Palpations: Abdomen is soft.     Tenderness: There is no abdominal tenderness.  Musculoskeletal:        General: Normal range of motion.     Cervical back: Normal range of motion and neck supple.  Lymphadenopathy:     Cervical: No cervical adenopathy.  Skin:    General: Skin is warm and dry.  Neurological:     Mental Status: She is alert and oriented to person, place, and time.  Psychiatric:        Behavior: Behavior normal.        Thought Content: Thought content normal.        Judgment: Judgment normal.       Assessment & Plan:   Erin Ewing was seen today for medical management of chronic issues.  Diagnoses and all orders for this visit:  Hypothyroidism, unspecified type -     levothyroxine (SYNTHROID) 125 MCG tablet; Take 1 tablet (125 mcg total) by mouth daily. -     CBC with Differential/Platelet -     CMP14+EGFR -     TSH + free T4 -     Lipid panel  Moderate episode of recurrent major depressive disorder (HCC) -     DULoxetine (CYMBALTA) 60 MG capsule; Take 1 capsule (60 mg total) by mouth daily. -     CBC with Differential/Platelet -     CMP14+EGFR -     TSH + free T4 -     Lipid panel  Primary hypertension -     olmesartan-hydrochlorothiazide (BENICAR HCT) 40-12.5 MG tablet; Take 1 tablet by mouth daily. -     CBC with Differential/Platelet -     CMP14+EGFR -     TSH + free T4 -     Lipid panel  Acute bronchitis due to COVID-19 virus -     dexamethasone (DECADRON) 6 MG tablet; Take 1 tablet (6 mg total) by mouth  2 (two) times daily with a meal.  Arthritis -     gabapentin (NEURONTIN) 300 MG capsule; Take 1 capsule (300 mg total) by mouth 2 (two) times daily.  Other orders -     Discontinue: tolterodine (DETROL LA) 2 MG 24 hr capsule; Take 1 capsule (2 mg total) by mouth daily. -     clopidogrel (PLAVIX) 75 MG tablet; Take 1 tablet (75 mg total) by mouth daily.       I have discontinued Cherity [place]ken[place]. [place]'s colestipol, tolterodine, and tolterodine. I have also  changed her levothyroxine. Additionally, I am having her start on dexamethasone. Lastly, I am having her maintain her aspirin, (Diphenhydramine-APAP, sleep, (TYLENOL PM EXTRA STRENGTH PO)), Calcium Carb-Cholecalciferol, olmesartan-hydrochlorothiazide, gabapentin, DULoxetine, and clopidogrel.  Allergies as of 08/05/2020      Reactions   Fish Allergy Anaphylaxis   Peanuts [peanut Oil] Anaphylaxis   Celebrex [celecoxib] Diarrhea   Diclofenac Diarrhea      Medication List       Accurate as of August 05, 2020  3:32 PM. If you have any questions, ask your nurse or doctor.        STOP taking these medications   colestipol 5 g granules Commonly known as: COLESTID Stopped by: Claretta Fraise, MD   tolterodine 2 MG 24 hr capsule Commonly known as: DETROL LA Stopped by: Claretta Fraise, MD     TAKE these medications   aspirin 81 MG tablet Take 81 mg by mouth daily.   Calcium Carb-Cholecalciferol 600-800 MG-UNIT Tabs Take 1 tablet 2 (two) times daily by mouth.   clopidogrel 75 MG tablet Commonly known as: PLAVIX Take 1 tablet (75 mg total) by mouth daily.   dexamethasone 6 MG tablet Commonly known as: DECADRON Take 1 tablet (6 mg total) by mouth 2 (two) times daily with a meal. Started by: Claretta Fraise, MD   DULoxetine 60 MG capsule Commonly known as: Cymbalta Take 1 capsule (60 mg total) by mouth daily.   gabapentin 300 MG capsule Commonly known as: NEURONTIN Take 1 capsule (300 mg total) by mouth 2 (two) times daily.    levothyroxine 125 MCG tablet Commonly known as: SYNTHROID Take 1 tablet (125 mcg total) by mouth daily.   olmesartan-hydrochlorothiazide 40-12.5 MG tablet Commonly known as: BENICAR HCT Take 1 tablet by mouth daily.   TYLENOL PM EXTRA STRENGTH PO Take 1 tablet at bedtime as needed by mouth.        Follow-up: Return in about 6 months (around 02/02/2021).  Claretta Fraise, M.D.

## 2020-08-05 NOTE — Telephone Encounter (Signed)
DC tolterodine!

## 2020-08-06 LAB — CMP14+EGFR
ALT: 13 IU/L (ref 0–32)
AST: 10 IU/L (ref 0–40)
Albumin/Globulin Ratio: 1.4 (ref 1.2–2.2)
Albumin: 3.8 g/dL (ref 3.6–4.6)
Alkaline Phosphatase: 89 IU/L (ref 44–121)
BUN/Creatinine Ratio: 19 (ref 12–28)
BUN: 17 mg/dL (ref 8–27)
Bilirubin Total: 0.3 mg/dL (ref 0.0–1.2)
CO2: 24 mmol/L (ref 20–29)
Calcium: 10.8 mg/dL — ABNORMAL HIGH (ref 8.7–10.3)
Chloride: 100 mmol/L (ref 96–106)
Creatinine, Ser: 0.88 mg/dL (ref 0.57–1.00)
GFR calc Af Amer: 71 mL/min/{1.73_m2} (ref >59)
GFR calc non Af Amer: 61 mL/min/{1.73_m2} (ref >59)
Globulin, Total: 2.7 g/dL (ref 1.5–4.5)
Glucose: 108 mg/dL — ABNORMAL HIGH (ref 65–99)
Potassium: 4.5 mmol/L (ref 3.5–5.2)
Sodium: 139 mmol/L (ref 134–144)
Total Protein: 6.5 g/dL (ref 6.0–8.5)

## 2020-08-06 LAB — LIPID PANEL
Chol/HDL Ratio: 4.5 ratio — ABNORMAL HIGH (ref 0.0–4.4)
Cholesterol, Total: 162 mg/dL (ref 100–199)
HDL: 36 mg/dL — ABNORMAL LOW (ref >39)
LDL Chol Calc (NIH): 97 mg/dL (ref 0–99)
Triglycerides: 168 mg/dL — ABNORMAL HIGH (ref 0–149)
VLDL Cholesterol Cal: 29 mg/dL (ref 5–40)

## 2020-08-06 LAB — CBC WITH DIFFERENTIAL/PLATELET
Basophils Absolute: 0 10*3/uL (ref 0.0–0.2)
Basos: 0 %
EOS (ABSOLUTE): 0.2 10*3/uL (ref 0.0–0.4)
Eos: 3 %
Hematocrit: 46.3 % (ref 34.0–46.6)
Hemoglobin: 15.6 g/dL (ref 11.1–15.9)
Immature Grans (Abs): 0 10*3/uL (ref 0.0–0.1)
Immature Granulocytes: 0 %
Lymphocytes Absolute: 1.9 10*3/uL (ref 0.7–3.1)
Lymphs: 27 %
MCH: 29.9 pg (ref 26.6–33.0)
MCHC: 33.7 g/dL (ref 31.5–35.7)
MCV: 89 fL (ref 79–97)
Monocytes Absolute: 0.4 10*3/uL (ref 0.1–0.9)
Monocytes: 6 %
Neutrophils Absolute: 4.6 10*3/uL (ref 1.4–7.0)
Neutrophils: 64 %
Platelets: 367 10*3/uL (ref 150–450)
RBC: 5.22 x10E6/uL (ref 3.77–5.28)
RDW: 12.6 % (ref 11.7–15.4)
WBC: 7.2 10*3/uL (ref 3.4–10.8)

## 2020-08-06 LAB — TSH+FREE T4
Free T4: 1.44 ng/dL (ref 0.82–1.77)
TSH: 1.73 u[IU]/mL (ref 0.450–4.500)

## 2020-08-06 NOTE — Telephone Encounter (Signed)
Nettie Elm aware.  Medication is no longer on patient's med list.

## 2020-08-07 NOTE — Progress Notes (Signed)
Hello Seaira, ? ?Your lab result is normal and/or stable.Some minor variations that are not significant are commonly marked abnormal, but do not represent any medical problem for you. ? ?Best regards, ?Sherwin Hollingshed, M.D.

## 2020-09-09 ENCOUNTER — Encounter: Payer: Self-pay | Admitting: Family Medicine

## 2020-09-09 ENCOUNTER — Telehealth (INDEPENDENT_AMBULATORY_CARE_PROVIDER_SITE_OTHER): Payer: Medicare HMO | Admitting: Family Medicine

## 2020-09-09 DIAGNOSIS — R2689 Other abnormalities of gait and mobility: Secondary | ICD-10-CM

## 2020-09-09 DIAGNOSIS — I739 Peripheral vascular disease, unspecified: Secondary | ICD-10-CM

## 2020-09-09 DIAGNOSIS — M170 Bilateral primary osteoarthritis of knee: Secondary | ICD-10-CM | POA: Diagnosis not present

## 2020-09-09 DIAGNOSIS — M1990 Unspecified osteoarthritis, unspecified site: Secondary | ICD-10-CM | POA: Diagnosis not present

## 2020-09-09 NOTE — Progress Notes (Signed)
Subjective:  Patient ID: Erin Ewing, female    DOB: May 04, 1936  Age: 85 y.o. MRN: [id number]  CC: No chief complaint on file.   HPI BRIANNI MANTHE presents for need for help with strength and balance. She is currently wheelchair bound. She requests a physical therapy referral. She has been through a covid infection and then was anxious, constantly crying over her sisters injury for three weeks. Sister lives in [place] and [name] was unable to go see her. As a result she was unwilling to take PT. She states she is doing better and now ready to start rehab with P.T. Ms. Reyburn had 5 fallls at home that made her decide to enter the ALF. She would like to be able to walk with a walker. She has had pain and weakness in the right knee that is now getting better.   Follow-up of diabetes.  Patient denies symptoms such as polyuria, polydipsia, excessive hunger, nausea No significant hypoglycemic spells noted. Medications reviewed. Pt reports taking them regularly without complication/adverse reaction being reported today.   History Armanda has a past medical history of Arthritis, Hypertension, Obesity, Polycythemia, Reflex sympathetic dystrophy (08/16/2012), and Subclinical hypothyroidism.   She has a past surgical history that includes ABDOMINAL AORTOGRAM W/LOWER EXTREMITY (N/A, 05/30/2017); PERIPHERAL VASCULAR INTERVENTION (Right, 05/30/2017); and PERIPHERAL VASCULAR ATHERECTOMY (Left, 05/30/2017).   Her family history includes Heart disease in her father.She reports that she has been smoking cigarettes. She has never used smokeless tobacco. She reports that she does not drink alcohol and does not use drugs.  Current Outpatient Medications on File Prior to Visit  Medication Sig Dispense Refill  . aspirin 81 MG tablet Take 81 mg by mouth daily. (Patient not taking: Reported on 08/05/2020)    . Calcium Carb-Cholecalciferol 600-800 MG-UNIT TABS Take 1 tablet 2 (two) times daily by mouth.     . clopidogrel (PLAVIX) 75 MG tablet Take 1 tablet (75 mg total) by mouth daily. 90 tablet 1  . dexamethasone (DECADRON) 6 MG tablet Take 1 tablet (6 mg total) by mouth 2 (two) times daily with a meal. 10 tablet 0  . Diphenhydramine-APAP, sleep, (TYLENOL PM EXTRA STRENGTH PO) Take 1 tablet at bedtime as needed by mouth.  (Patient not taking: Reported on 08/05/2020)    . DULoxetine (CYMBALTA) 60 MG capsule Take 1 capsule (60 mg total) by mouth daily. 90 capsule 1  . gabapentin (NEURONTIN) 300 MG capsule Take 1 capsule (300 mg total) by mouth 2 (two) times daily. 180 capsule 1  . levothyroxine (SYNTHROID) 125 MCG tablet Take 1 tablet (125 mcg total) by mouth daily. 90 tablet 1  . olmesartan-hydrochlorothiazide (BENICAR HCT) 40-12.5 MG tablet Take 1 tablet by mouth daily. 90 tablet 1   No current facility-administered medications on file prior to visit.    ROS Review of Systems  Constitutional: Negative.   HENT: Negative.   Eyes: Negative for visual disturbance.  Respiratory: Negative for shortness of breath.   Cardiovascular: Negative for chest pain.  Gastrointestinal: Negative for abdominal pain.  Musculoskeletal: Negative for arthralgias.    Objective:  There were no vitals taken for this visit.  BP Readings from Last 3 Encounters:  08/05/20 124/83  04/02/20 131/89  03/12/20 (!) 122/91    Wt Readings from Last 3 Encounters:  08/05/20 185 lb 6.4 oz (84.1 kg)  04/02/20 179 lb (81.2 kg)  03/12/20 183 lb (83 kg)     Physical Exam  Exam deferred. Pt. Harboring due to COVID  19. Phone visit performed.   Assessment & Plan:   Diagnoses and all orders for this visit:  Arthritis -     Ambulatory referral to Home Health  Arthritis of both knees -     Ambulatory referral to Home Health  Peripheral arterial disease [facility]) -     Ambulatory referral to Home Health  Imbalance -     Ambulatory referral to Home Health   Virtual Video Visit  I discussed the limitations,  risks, security and privacy concerns of performing an evaluation and management service by video and the availability of in person appointments. I also discussed with the patient that there may be a patient responsible charge related to this service. The patient expressed understanding and agreed to proceed. Pt. Is at home. Dr. Darlyn Read is in his office.  Follow Up Instructions:   I discussed the assessment and treatment plan with the patient. The patient was provided an opportunity to ask questions and all were answered. The patient agreed with the plan and demonstrated an understanding of the instructions.   The patient was advised to call back or seek an in-person evaluation if the symptoms worsen or if the condition fails to improve as anticipated.  Total minutes including chart review and phone contact time: 29   I am having Erin Ewing maintain her aspirin, (Diphenhydramine-APAP, sleep, (TYLENOL PM EXTRA STRENGTH PO)), Calcium Carb-Cholecalciferol, olmesartan-hydrochlorothiazide, levothyroxine, gabapentin, DULoxetine, clopidogrel, and dexamethasone.     Follow-up: Return if symptoms worsen or fail to improve.  Mechele Claude, M.D.

## 2020-09-18 ENCOUNTER — Other Ambulatory Visit: Payer: Self-pay | Admitting: Family Medicine

## 2020-09-18 DIAGNOSIS — E039 Hypothyroidism, unspecified: Secondary | ICD-10-CM

## 2020-12-19 ENCOUNTER — Other Ambulatory Visit: Payer: Self-pay | Admitting: Family Medicine

## 2021-02-02 ENCOUNTER — Ambulatory Visit: Payer: Medicare HMO

## 2021-02-02 ENCOUNTER — Other Ambulatory Visit: Payer: Self-pay

## 2021-02-02 ENCOUNTER — Ambulatory Visit (INDEPENDENT_AMBULATORY_CARE_PROVIDER_SITE_OTHER): Payer: Medicare HMO | Admitting: Family Medicine

## 2021-02-02 ENCOUNTER — Encounter: Payer: Self-pay | Admitting: Family Medicine

## 2021-02-02 VITALS — BP 130/77 | HR 62 | Temp 97.5°F

## 2021-02-02 DIAGNOSIS — I10 Essential (primary) hypertension: Secondary | ICD-10-CM

## 2021-02-02 DIAGNOSIS — F331 Major depressive disorder, recurrent, moderate: Secondary | ICD-10-CM | POA: Diagnosis not present

## 2021-02-02 DIAGNOSIS — E785 Hyperlipidemia, unspecified: Secondary | ICD-10-CM

## 2021-02-02 DIAGNOSIS — E039 Hypothyroidism, unspecified: Secondary | ICD-10-CM | POA: Diagnosis not present

## 2021-02-02 DIAGNOSIS — Z780 Asymptomatic menopausal state: Secondary | ICD-10-CM

## 2021-02-02 DIAGNOSIS — M1990 Unspecified osteoarthritis, unspecified site: Secondary | ICD-10-CM

## 2021-02-02 MED ORDER — OLMESARTAN MEDOXOMIL-HCTZ 40-12.5 MG PO TABS: 1.0000 | ORAL_TABLET | Freq: Every day | ORAL | 3 refills | Status: DC

## 2021-02-02 MED ORDER — LEVOTHYROXINE SODIUM 125 MCG PO TABS: 125.0000 ug | ORAL_TABLET | Freq: Every day | ORAL | 3 refills | Status: DC

## 2021-02-02 MED ORDER — CLOPIDOGREL BISULFATE 75 MG PO TABS: 75.0000 mg | ORAL_TABLET | Freq: Every day | ORAL | 3 refills | Status: DC

## 2021-02-02 MED ORDER — DULOXETINE HCL 60 MG PO CPEP: 60.0000 mg | ORAL_CAPSULE | Freq: Every day | ORAL | 3 refills | Status: DC

## 2021-02-02 MED ORDER — GABAPENTIN 600 MG PO TABS: 600.0000 mg | ORAL_TABLET | Freq: Two times a day (BID) | ORAL | 5 refills | Status: DC

## 2021-02-02 NOTE — Progress Notes (Signed)
Subjective:  Patient ID: Erin Ewing, female    DOB: 04/16/1936  Age: 85 y.o. MRN: [id number]  CC: Medical Management of Chronic Issues   HPI Erin Ewing presents for [facility] bound due to leg weakness. Not improving with therapy. Had fallen 6 times at home. ALF had her use WC due to instability. PT was ordered, but at this time pt. Can't stand on her own any more.   Patient presents for follow-up on  thyroid. The patient has a history of hypothyroidism for many years. It has been stable recently. Pt. denies any change in  voice, loss of hair, heat or cold intolerance. Energy level has been adequate to good. Patient denies constipation and diarrhea. No myxedema. Medication is as noted below. Verified that pt is taking it daily on an empty stomach. Well tolerated.  Depression screen [facility] 2/9 02/02/2021 02/02/2021 08/05/2020  Decreased Interest 2 0 0  Down, Depressed, Hopeless 2 0 0  PHQ - 2 Score 4 0 0  Altered sleeping 3 - -  Tired, decreased energy 3 - -  Change in appetite 3 - -  Feeling bad or failure about yourself  3 - -  Trouble concentrating 3 - -  Moving slowly or fidgety/restless 1 - -  Suicidal thoughts 0 - -  PHQ-9 Score 20 - -  Difficult doing work/chores - - -  Some recent data might be hidden    History Erin Ewing has a past medical history of Arthritis, Hypertension, Obesity, Polycythemia, Reflex sympathetic dystrophy (08/16/2012), and Subclinical hypothyroidism.   She has a past surgical history that includes ABDOMINAL AORTOGRAM W/LOWER EXTREMITY (N/A, 05/30/2017); PERIPHERAL VASCULAR INTERVENTION (Right, 05/30/2017); and PERIPHERAL VASCULAR ATHERECTOMY (Left, 05/30/2017).   Her family history includes Heart disease in her father.She reports that she has been smoking cigarettes. She has never used smokeless tobacco. She reports that she does not drink alcohol and does not use drugs.    ROS Review of Systems  Constitutional: Negative.   HENT: Negative.    Eyes:   Negative for visual disturbance.  Respiratory:  Negative for shortness of breath.   Cardiovascular:  Negative for chest pain.  Gastrointestinal:  Negative for abdominal pain.  Musculoskeletal:  Negative for arthralgias.   Objective:  BP 130/77   Pulse 62   Temp (!) 97.5 F (36.4 C)   SpO2 96%   BP Readings from Last 3 Encounters:  02/02/21 130/77  08/05/20 124/83  04/02/20 131/89    Wt Readings from Last 3 Encounters:  08/05/20 185 lb 6.4 oz (84.1 kg)  04/02/20 179 lb (81.2 kg)  03/12/20 183 lb (83 kg)     Physical Exam Constitutional:      General: She is not in acute distress.    Appearance: She is well-developed.  HENT:     Head: Normocephalic and atraumatic.  Eyes:     Conjunctiva/sclera: Conjunctivae normal.     Pupils: Pupils are equal, round, and reactive to light.  Neck:     Thyroid: No thyromegaly.  Cardiovascular:     Rate and Rhythm: Normal rate and regular rhythm.     Heart sounds: Normal heart sounds. No murmur heard. Pulmonary:     Effort: Pulmonary effort is normal. No respiratory distress.     Breath sounds: Normal breath sounds. No wheezing or rales.  Abdominal:     General: Bowel sounds are normal. There is no distension.     Palpations: Abdomen is soft.     Tenderness: There is  no abdominal tenderness.  Musculoskeletal:        General: Normal range of motion.     Cervical back: Normal range of motion and neck supple.  Lymphadenopathy:     Cervical: No cervical adenopathy.  Skin:    General: Skin is warm and dry.  Neurological:     Mental Status: She is alert and oriented to person, place, and time.  Psychiatric:        Behavior: Behavior normal.        Thought Content: Thought content normal.        Judgment: Judgment normal.      Assessment & Plan:   Erin Ewing was seen today for medical management of chronic issues.  Diagnoses and all orders for this visit:  Primary hypertension -     CBC with Differential/Platelet -      CMP14+EGFR -     olmesartan-hydrochlorothiazide (BENICAR HCT) 40-12.5 MG tablet; Take 1 tablet by mouth daily.  Hypothyroidism, unspecified type -     TSH + free T4 -     levothyroxine (SYNTHROID) 125 MCG tablet; Take 1 tablet (125 mcg total) by mouth daily.  Hyperlipidemia, unspecified hyperlipidemia type -     Lipid panel  Moderate episode of recurrent major depressive disorder (HCC) -     DULoxetine (CYMBALTA) 60 MG capsule; Take 1 capsule (60 mg total) by mouth daily.  Arthritis  Postmenopause -     DG Bone Density; Future  Other orders -     clopidogrel (PLAVIX) 75 MG tablet; Take 1 tablet (75 mg total) by mouth daily. -     gabapentin (NEURONTIN) 600 MG tablet; Take 1 tablet (600 mg total) by mouth 2 (two) times daily.      I have discontinued Erin Ewing. Erin Ewing's aspirin, gabapentin, and dexamethasone. I have also changed her levothyroxine. Additionally, I am having her start on gabapentin. Lastly, I am having her maintain her (Diphenhydramine-APAP, sleep, (TYLENOL PM EXTRA STRENGTH PO)), Aspirin Low Dose, Calcium 600/Vitamin D3, clopidogrel, DULoxetine, and olmesartan-hydrochlorothiazide.  Allergies as of 02/02/2021       Reactions   Fish Allergy Anaphylaxis   Peanuts [peanut Oil] Anaphylaxis   Celebrex [celecoxib] Diarrhea   Diclofenac Diarrhea        Medication List        Accurate as of February 02, 2021  5:13 PM. If you have any questions, ask your nurse or doctor.          STOP taking these medications    dexamethasone 6 MG tablet Commonly known as: DECADRON Stopped by: Claretta Fraise, MD   gabapentin 300 MG capsule Commonly known as: NEURONTIN Replaced by: gabapentin 600 MG tablet Stopped by: Claretta Fraise, MD       TAKE these medications    Aspirin Low Dose 81 MG chewable tablet Generic drug: aspirin TAKE (1) TABLET BY MOUTH ONCE DAILY. What changed: Another medication with the same name was removed. Continue taking this medication, and  follow the directions you see here. Changed by: Claretta Fraise, MD   Calcium 600/Vitamin D3 600-800 MG-UNIT Tabs Generic drug: Calcium Carb-Cholecalciferol TAKE (1) TABLET BY MOUTH TWICE DAILY.   clopidogrel 75 MG tablet Commonly known as: PLAVIX Take 1 tablet (75 mg total) by mouth daily.   DULoxetine 60 MG capsule Commonly known as: Cymbalta Take 1 capsule (60 mg total) by mouth daily.   gabapentin 600 MG tablet Commonly known as: NEURONTIN Take 1 tablet (600 mg total) by mouth 2 (two) times daily.  Replaces: gabapentin 300 MG capsule Started by: Claretta Fraise, MD   levothyroxine 125 MCG tablet Commonly known as: SYNTHROID Take 1 tablet (125 mcg total) by mouth daily.   olmesartan-hydrochlorothiazide 40-12.5 MG tablet Commonly known as: BENICAR HCT Take 1 tablet by mouth daily.   TYLENOL PM EXTRA STRENGTH PO Take 1 tablet at bedtime as needed by mouth.         Follow-up: Return in about 6 months (around 08/05/2021).  Claretta Fraise, M.D.

## 2021-02-03 LAB — CBC WITH DIFFERENTIAL/PLATELET
Basophils Absolute: 0 10*3/uL (ref 0.0–0.2)
Basos: 0 %
EOS (ABSOLUTE): 0.2 10*3/uL (ref 0.0–0.4)
Eos: 2 %
Hematocrit: 45.2 % (ref 34.0–46.6)
Hemoglobin: 14.9 g/dL (ref 11.1–15.9)
Immature Grans (Abs): 0 10*3/uL (ref 0.0–0.1)
Immature Granulocytes: 0 %
Lymphocytes Absolute: 1.4 10*3/uL (ref 0.7–3.1)
Lymphs: 18 %
MCH: 29.5 pg (ref 26.6–33.0)
MCHC: 33 g/dL (ref 31.5–35.7)
MCV: 90 fL (ref 79–97)
Monocytes Absolute: 0.6 10*3/uL (ref 0.1–0.9)
Monocytes: 8 %
Neutrophils Absolute: 5.7 10*3/uL (ref 1.4–7.0)
Neutrophils: 72 %
Platelets: 304 10*3/uL (ref 150–450)
RBC: 5.05 x10E6/uL (ref 3.77–5.28)
RDW: 13.8 % (ref 11.7–15.4)
WBC: 7.9 10*3/uL (ref 3.4–10.8)

## 2021-02-03 LAB — CMP14+EGFR
ALT: 12 IU/L (ref 0–32)
AST: 11 IU/L (ref 0–40)
Albumin/Globulin Ratio: 1.7 (ref 1.2–2.2)
Albumin: 3.8 g/dL (ref 3.6–4.6)
Alkaline Phosphatase: 99 IU/L (ref 44–121)
BUN/Creatinine Ratio: 18 (ref 12–28)
BUN: 13 mg/dL (ref 8–27)
Bilirubin Total: 0.4 mg/dL (ref 0.0–1.2)
CO2: 25 mmol/L (ref 20–29)
Calcium: 9.9 mg/dL (ref 8.7–10.3)
Chloride: 101 mmol/L (ref 96–106)
Creatinine, Ser: 0.73 mg/dL (ref 0.57–1.00)
Globulin, Total: 2.2 g/dL (ref 1.5–4.5)
Glucose: 101 mg/dL — ABNORMAL HIGH (ref 65–99)
Potassium: 4.1 mmol/L (ref 3.5–5.2)
Sodium: 140 mmol/L (ref 134–144)
Total Protein: 6 g/dL (ref 6.0–8.5)
eGFR: 82 mL/min/{1.73_m2} (ref >59)

## 2021-02-03 LAB — LIPID PANEL
Chol/HDL Ratio: 3.7 ratio (ref 0.0–4.4)
Cholesterol, Total: 153 mg/dL (ref 100–199)
HDL: 41 mg/dL (ref >39)
LDL Chol Calc (NIH): 84 mg/dL (ref 0–99)
Triglycerides: 162 mg/dL — ABNORMAL HIGH (ref 0–149)
VLDL Cholesterol Cal: 28 mg/dL (ref 5–40)

## 2021-02-03 LAB — TSH+FREE T4
Free T4: 1.31 ng/dL (ref 0.82–1.77)
TSH: 1.2 u[IU]/mL (ref 0.450–4.500)

## 2021-02-03 NOTE — Progress Notes (Signed)
Hello Erin Ewing, ? ?Your lab result is normal and/or stable.Some minor variations that are not significant are commonly marked abnormal, but do not represent any medical problem for you. ? ?Best regards, ?Jameela Michna, M.D.

## 2021-03-07 ENCOUNTER — Other Ambulatory Visit: Payer: Self-pay

## 2021-03-07 ENCOUNTER — Encounter (HOSPITAL_COMMUNITY): Payer: Self-pay

## 2021-03-07 ENCOUNTER — Emergency Department (HOSPITAL_COMMUNITY): Payer: Medicare HMO

## 2021-03-07 ENCOUNTER — Emergency Department (HOSPITAL_COMMUNITY)
Admission: EM | Admit: 2021-03-07 | Discharge: 2021-03-07 | Disposition: A | Discharge status: Home / Self Care | Payer: Medicare HMO | Attending: Emergency Medicine | Admitting: Emergency Medicine

## 2021-03-07 DIAGNOSIS — Y92129 Unspecified place in nursing home as the place of occurrence of the external cause: Secondary | ICD-10-CM | POA: Diagnosis not present

## 2021-03-07 DIAGNOSIS — Z79899 Other long term (current) drug therapy: Secondary | ICD-10-CM | POA: Diagnosis not present

## 2021-03-07 DIAGNOSIS — M25561 Pain in right knee: Secondary | ICD-10-CM | POA: Insufficient documentation

## 2021-03-07 DIAGNOSIS — M25551 Pain in right hip: Secondary | ICD-10-CM | POA: Diagnosis not present

## 2021-03-07 DIAGNOSIS — Z91010 Allergy to peanuts: Secondary | ICD-10-CM | POA: Insufficient documentation

## 2021-03-07 DIAGNOSIS — F17210 Nicotine dependence, cigarettes, uncomplicated: Secondary | ICD-10-CM | POA: Insufficient documentation

## 2021-03-07 DIAGNOSIS — E039 Hypothyroidism, unspecified: Secondary | ICD-10-CM | POA: Insufficient documentation

## 2021-03-07 DIAGNOSIS — M25562 Pain in left knee: Secondary | ICD-10-CM | POA: Insufficient documentation

## 2021-03-07 DIAGNOSIS — I10 Essential (primary) hypertension: Secondary | ICD-10-CM | POA: Diagnosis not present

## 2021-03-07 DIAGNOSIS — W050XXA Fall from non-moving wheelchair, initial encounter: Secondary | ICD-10-CM | POA: Insufficient documentation

## 2021-03-07 DIAGNOSIS — W19XXXA Unspecified fall, initial encounter: Secondary | ICD-10-CM

## 2021-03-07 DIAGNOSIS — Z7982 Long term (current) use of aspirin: Secondary | ICD-10-CM | POA: Insufficient documentation

## 2021-03-07 DIAGNOSIS — M79604 Pain in right leg: Secondary | ICD-10-CM

## 2021-03-07 NOTE — ED Notes (Signed)
Report called to [facility]. Pt awaiting for transport to arrive.

## 2021-03-07 NOTE — ED Provider Notes (Signed)
[facility] EMERGENCY DEPARTMENT Provider Note   CSN: [id number] Arrival date & time: 03/07/21  1702     History Chief Complaint  Patient presents with   Fall   Leg Pain    Erin Ewing is a 85 y.o. female.  She is brought in by EMS for her facility after she fell out of her wheelchair.  She denies loss of consciousness and denies hitting her head or neck.  She is complaining of pain to both her knees and her right hip.  She said she has chronic knee issues and does not ambulate.  She is on Plavix  The history is provided by the patient and the EMS personnel.  Fall This is a new problem. The current episode started 1 to 2 hours ago. The problem has not changed since onset.Pertinent negatives include no chest pain, no abdominal pain, no headaches and no shortness of breath. The symptoms are aggravated by bending and twisting. Nothing relieves the symptoms. She has tried rest for the symptoms. The treatment provided no relief.  Leg Pain Associated symptoms: no fever and no neck pain       Past Medical History:  Diagnosis Date   Arthritis    kneew   Hypertension    Obesity    Polycythemia    secondary to cigarette use   Reflex sympathetic dystrophy 08/16/2012   Subclinical hypothyroidism     Patient Active Problem List   Diagnosis Date Noted   Peripheral arterial disease (HCC) 04/28/2017   Vitamin D deficiency 09/09/2013   Insomnia 09/09/2013   Hypothyroid 09/09/2013   Arthritis 09/09/2013   Depression 11/13/2012   HTN (hypertension) 09/18/2012   Tobacco use disorder 09/18/2012   Polycythemia, secondary 09/18/2012   Arthritis of both knees 09/18/2012   Obesity 09/18/2012    Past Surgical History:  Procedure Laterality Date   ABDOMINAL AORTOGRAM W/LOWER EXTREMITY N/A 05/30/2017   Procedure: ABDOMINAL AORTOGRAM W/LOWER EXTREMITY;  Surgeon: Nada Libman, MD;  Location: [facility] INVASIVE CV LAB;  Service: Cardiovascular;  Laterality: N/A;   PERIPHERAL VASCULAR  ATHERECTOMY Left 05/30/2017   Procedure: PERIPHERAL VASCULAR ATHERECTOMY;  Surgeon: Nada Libman, MD;  Location: [facility] INVASIVE CV LAB;  Service: Cardiovascular;  Laterality: Left;  superficial femoral   PERIPHERAL VASCULAR INTERVENTION Right 05/30/2017   Procedure: PERIPHERAL VASCULAR INTERVENTION;  Surgeon: Nada Libman, MD;  Location: [facility] INVASIVE CV LAB;  Service: Cardiovascular;  Laterality: Right;  Common iliac     OB History   No obstetric history on file.     Family History  Problem Relation Age of Onset   Heart disease Father     Social History   Tobacco Use   Smoking status: Every Day    Types: Cigarettes   Smokeless tobacco: Never   Tobacco comments:    4-5 cigarettes per day  Substance Use Topics   Alcohol use: No   Drug use: No    Home Medications Prior to Admission medications   Medication Sig Start Date End Date Taking? Authorizing Provider  ASPIRIN LOW DOSE 81 MG chewable tablet TAKE (1) TABLET BY MOUTH ONCE DAILY. 12/21/20   Mechele Claude, MD  CALCIUM 600/VITAMIN D3 600-800 MG-UNIT TABS TAKE (1) TABLET BY MOUTH TWICE DAILY. 12/21/20   Mechele Claude, MD  clopidogrel (PLAVIX) 75 MG tablet Take 1 tablet (75 mg total) by mouth daily. 02/02/21   Mechele Claude, MD  Diphenhydramine-APAP, sleep, (TYLENOL PM EXTRA STRENGTH PO) Take 1 tablet at bedtime as needed by mouth.  Patient not taking: No sig reported    [provider]  DULoxetine (CYMBALTA) 60 MG capsule Take 1 capsule (60 mg total) by mouth daily. 02/02/21   Mechele Claude, MD  gabapentin (NEURONTIN) 600 MG tablet Take 1 tablet (600 mg total) by mouth 2 (two) times daily. 02/02/21   Mechele Claude, MD  levothyroxine (SYNTHROID) 125 MCG tablet Take 1 tablet (125 mcg total) by mouth daily. 02/02/21   Mechele Claude, MD  olmesartan-hydrochlorothiazide (BENICAR HCT) 40-12.5 MG tablet Take 1 tablet by mouth daily. 02/02/21   Mechele Claude, MD    Allergies    Fish allergy, Peanuts [peanut oil],  Celebrex [celecoxib], and Diclofenac  Review of Systems   Review of Systems  Constitutional:  Negative for fever.  HENT:  Negative for sore throat.   Eyes:  Negative for pain.  Respiratory:  Negative for shortness of breath.   Cardiovascular:  Negative for chest pain.  Gastrointestinal:  Negative for abdominal pain.  Genitourinary:  Negative for dysuria.  Musculoskeletal:  Positive for gait problem. Negative for neck pain.  Skin:  Negative for rash.  Neurological:  Negative for headaches.   Physical Exam Updated Vital Signs BP 130/71 (BP Location: Right Arm)   Pulse 82   Temp (!) 97.1 F (36.2 C) (Oral)   Resp 18   Ht 5\' 7"  (1.702 m)   Wt 84.5 kg   SpO2 98%   BMI 29.18 kg/m   Physical Exam Vitals and nursing note reviewed.  Constitutional:      General: She is not in acute distress.    Appearance: Normal appearance. She is well-developed.  HENT:     Head: Normocephalic and atraumatic.  Eyes:     Conjunctiva/sclera: Conjunctivae normal.  Cardiovascular:     Rate and Rhythm: Normal rate and regular rhythm.     Heart sounds: No murmur heard. Pulmonary:     Effort: Pulmonary effort is normal. No respiratory distress.     Breath sounds: Normal breath sounds.  Abdominal:     Palpations: Abdomen is soft.     Tenderness: There is no abdominal tenderness. There is no guarding or rebound.  Musculoskeletal:        General: Tenderness present. No deformity. Normal range of motion.     Cervical back: Neck supple.     Comments: Both her knees are diffusely tender although I do not appreciate any bruising or joint effusion.  She is also tender up her right thigh into her right hip.  Distal pulses and sensation intact.  Full range of motion of upper extremities without any pain or limitations.  No cervical thoracic or lumbar tenderness.  Skin:    General: Skin is warm and dry.  Neurological:     General: No focal deficit present.     Mental Status: She is alert. Mental status is  at baseline.    ED Results / Procedures / Treatments   Labs (all labs ordered are listed, but only abnormal results are displayed) Labs Reviewed - No data to display  EKG None  Radiology DG Knee Complete 4 Views Left  Result Date: 03/07/2021 CLINICAL DATA:  Status post fall. EXAM: LEFT KNEE - COMPLETE 4+ VIEW COMPARISON:  None. FINDINGS: No evidence of acute fracture or dislocation. There is marked severity medial and lateral tibiofemoral compartment space narrowing. Moderate severity patellofemoral narrowing is also noted. A very small joint effusion is seen. IMPRESSION: 1. No acute fracture or dislocation. 2. Severe tricompartmental degenerative changes with very small  joint effusion. Electronically Signed   By: Aram Candela M.D.   On: 03/07/2021 19:10   DG Knee Complete 4 Views Right  Result Date: 03/07/2021 CLINICAL DATA:  Fall and right lower extremity pain. EXAM: RIGHT KNEE - COMPLETE 4+ VIEW; RIGHT FEMUR 2 VIEWS COMPARISON:  None. FINDINGS: There is no acute fracture or dislocation. The bones are osteopenic. There is moderate arthritic changes of the right hip and severe osteoarthritic changes of the right knee. No joint effusion. The soft tissues are unremarkable. IMPRESSION: 1. No acute fracture or dislocation. 2. Osteopenia and arthritic changes. Electronically Signed   By: Elgie Collard M.D.   On: 03/07/2021 19:19   DG Hip Unilat With Pelvis 2-3 Views Right  Result Date: 03/07/2021 CLINICAL DATA:  Status post fall. EXAM: DG HIP (WITH OR WITHOUT PELVIS) 2-3V RIGHT COMPARISON:  None. FINDINGS: There is no evidence of hip fracture or dislocation. Degenerative changes are seen in the form of joint space narrowing and acetabular sclerosis. IMPRESSION: No acute osseous injury. Electronically Signed   By: Aram Candela M.D.   On: 03/07/2021 19:08   DG Femur Min 2 Views Right  Result Date: 03/07/2021 CLINICAL DATA:  Fall and right lower extremity pain. EXAM: RIGHT KNEE -  COMPLETE 4+ VIEW; RIGHT FEMUR 2 VIEWS COMPARISON:  None. FINDINGS: There is no acute fracture or dislocation. The bones are osteopenic. There is moderate arthritic changes of the right hip and severe osteoarthritic changes of the right knee. No joint effusion. The soft tissues are unremarkable. IMPRESSION: 1. No acute fracture or dislocation. 2. Osteopenia and arthritic changes. Electronically Signed   By: Elgie Collard M.D.   On: 03/07/2021 19:19    Procedures Procedures   Medications Ordered in ED Medications - No data to display  ED Course  I have reviewed the triage vital signs and the nursing notes.  Pertinent labs & imaging results that were available during my care of the patient were reviewed by me and considered in my medical decision making (see chart for details).    MDM Rules/Calculators/A&P                          This patient complains of bilateral knee pain right hip pain after a fall; this involves an extensive number of treatment Options and is a complaint that carries with it a high risk of complications and Morbidity. The differential includes fracture, dislocation, contusion I ordered imaging studies which included pelvis and right hip right knee left knee right femur and I independently    visualized and interpreted imaging which showed degenerative changes no acute fracture or dislocation Additional history obtained from EMS Previous records obtained and reviewed in epic no recent admissions  After the interventions stated above, I reevaluated the patient and found patient to be otherwise hemodynamically stable.  Reviewed results of work-up with her.  She will be returned back to her facility.  Return instructions discussed   Final Clinical Impression(s) / ED Diagnoses Final diagnoses:  Right leg pain  Fall, initial encounter    Rx / DC Orders ED Discharge Orders     None        Terrilee Files, MD 03/08/21 [phone]

## 2021-03-07 NOTE — Discharge Instructions (Addendum)
You were seen in the emergency department for evaluation of injuries from a fall out of your wheelchair.  You had x-rays of your right hip femur and knee along with your left knee that did not show any acute fracture or dislocation.  You can use ice to the affected area and Tylenol as needed for pain.  Please follow-up with your primary care doctor and your orthopedic doctor.  Return to the emergency department if any worsening or concerning symptoms

## 2021-03-07 NOTE — ED Notes (Signed)
Pt provided discharge instructions and prescription information. Pt was given the opportunity to ask questions and questions were answered. Discharge signature not obtained in the setting of the COVID-19 pandemic in order to reduce high touch surfaces.    Pt care turned over to LTC staff providing transport.

## 2021-03-07 NOTE — ED Notes (Signed)
Patient transported to X-ray 

## 2021-03-07 NOTE — ED Triage Notes (Signed)
Fall at [facility], pt alert and oriented, complaining of pain in right leg, right hip and right knee.   Pt leaning toward the right for comfort.  Pt skin warm and dry

## 2021-03-08 ENCOUNTER — Encounter: Payer: Self-pay | Admitting: Nurse Practitioner

## 2021-03-08 ENCOUNTER — Ambulatory Visit (INDEPENDENT_AMBULATORY_CARE_PROVIDER_SITE_OTHER): Payer: Medicare HMO | Admitting: Nurse Practitioner

## 2021-03-08 DIAGNOSIS — M25551 Pain in right hip: Secondary | ICD-10-CM | POA: Diagnosis not present

## 2021-03-08 DIAGNOSIS — M79604 Pain in right leg: Secondary | ICD-10-CM | POA: Diagnosis not present

## 2021-03-08 DIAGNOSIS — W19XXXA Unspecified fall, initial encounter: Secondary | ICD-10-CM | POA: Insufficient documentation

## 2021-03-08 DIAGNOSIS — W19XXXD Unspecified fall, subsequent encounter: Secondary | ICD-10-CM | POA: Diagnosis not present

## 2021-03-08 NOTE — Progress Notes (Signed)
   Virtual Visit  Note Due to COVID-19 pandemic this visit was conducted virtually. This visit type was conducted due to national recommendations for restrictions regarding the COVID-19 Pandemic (e.g. social distancing, sheltering in place) in an effort to limit this patient's exposure and mitigate transmission in our community. All issues noted in this document were discussed and addressed.  A physical exam was not performed with this format.  I connected with Erin Ewing on 03/08/21 at 2:30 PM by telephone and verified that I am speaking with the correct person using two identifiers. Erin Ewing is currently located at facility during visit. The provider, Daryll Drown, NP is located in their office at time of visit.  I discussed the limitations, risks, security and privacy concerns of performing an evaluation and management service by telephone and the availability of in person appointments. I also discussed with the patient that there may be a patient responsible charge related to this service. The patient expressed understanding and agreed to proceed.   History and Present Illness:  HPI  Patient was recently seen in the emergency department for fall.  Patient fell out of her wheelchair but was not unconscious.  There was no acute fracture or dislocation.  Patient is reporting mild tenderness to her right thigh and right hip, patient was treated ,x-ray completed and sent back to facility. Today's follow-up visit patient is not reporting any tenderness, spoke to facility nurse who is with patient at the time.  Facility nurse reports patient is alert oriented and has no new concerns.  Review of Systems  Constitutional: Negative.   HENT: Negative.    Respiratory: Negative.    Cardiovascular: Negative.   Skin:  Negative for rash.  All other systems reviewed and are negative.   Observations/Objective: Televisit patient not in distress.  Assessment and Plan: No new symptoms of  pain or tenderness, no new falls since last ED visit.  Follow Up Instructions: Follow-up for worsening unresolved symptoms.    I discussed the assessment and treatment plan with the patient. The patient was provided an opportunity to ask questions and all were answered. The patient agreed with the plan and demonstrated an understanding of the instructions.   The patient was advised to call back or seek an in-person evaluation if the symptoms worsen or if the condition fails to improve as anticipated.  The above assessment and management plan was discussed with the patient. The patient verbalized understanding of and has agreed to the management plan. Patient is aware to call the clinic if symptoms persist or worsen. Patient is aware when to return to the clinic for a follow-up visit. Patient educated on when it is appropriate to go to the emergency department.   Time call ended: 2:40 PM  I provided 10 minutes of  non face-to-face time during this encounter.    Daryll Drown, NP

## 2021-03-08 NOTE — Assessment & Plan Note (Signed)
No new symptoms of pain or tenderness, no new falls since last ED visit.

## 2021-03-10 ENCOUNTER — Ambulatory Visit (INDEPENDENT_AMBULATORY_CARE_PROVIDER_SITE_OTHER): Payer: Medicare HMO | Admitting: Family Medicine

## 2021-03-10 ENCOUNTER — Encounter: Payer: Self-pay | Admitting: Family Medicine

## 2021-03-10 DIAGNOSIS — U071 COVID-19: Secondary | ICD-10-CM

## 2021-03-10 MED ORDER — MOLNUPIRAVIR EUA 200MG CAPSULE: 4.0000 | ORAL_CAPSULE | Freq: Two times a day (BID) | ORAL | 0 refills | Status: AC

## 2021-03-10 NOTE — Progress Notes (Signed)
   Virtual Visit  Note Due to COVID-19 pandemic this visit was conducted virtually. This visit type was conducted due to national recommendations for restrictions regarding the COVID-19 Pandemic (e.g. social distancing, sheltering in place) in an effort to limit this patient's exposure and mitigate transmission in our community. All issues noted in this document were discussed and addressed.  A physical exam was not performed with this format.  I connected with Erin Ewing on 03/10/21 at 1259 by telephone and verified that I am speaking with the correct person using two identifiers. Erin Ewing is currently located at [facility] and the office manager is currently with her during visit. The provider, Gabriel Earing, FNP is located in their office at time of visit.  I discussed the limitations, risks, security and privacy concerns of performing an evaluation and management service by telephone and the availability of in person appointments. I also discussed with the patient that there may be a patient responsible charge related to this service. The patient expressed understanding and agreed to proceed.  CC: Covid  History and Present Illness:  HPI Erin Ewing tested positive for Covid on 03/08/21. She reports diarrhea yesterday but has no resolved. She denies cough, congestion, fever, chills, sore throat, ear pain, chest pain, or shortness of breath. She has been vaccinated.     ROS As per HPI.   Observations/Objective: Alert and oriented x 3. Able to speak in full sentences without difficulty.    Assessment and Plan: Erin Ewing was seen today for covid positive.  Diagnoses and all orders for this visit:  COVID-19 Molnupiravir sent in. Currently no symptoms today. Discussed quarantine for 5 days from positive test, then wear mask around others for an additional 5 days. If needed can take tylenol for pain/fever or plain mucinex for cough. -     molnupiravir EUA 200 mg  CAPS; Take 4 capsules (800 mg total) by mouth 2 (two) times daily for 5 days.    Follow Up Instructions: Return to office for new or worsening symptoms, or if symptoms persist.     I discussed the assessment and treatment plan with the patient. The patient was provided an opportunity to ask questions and all were answered. The patient agreed with the plan and demonstrated an understanding of the instructions.   The patient was advised to call back or seek an in-person evaluation if the symptoms worsen or if the condition fails to improve as anticipated.  The above assessment and management plan was discussed with the patient. The patient verbalized understanding of and has agreed to the management plan. Patient is aware to call the clinic if symptoms persist or worsen. Patient is aware when to return to the clinic for a follow-up visit. Patient educated on when it is appropriate to go to the emergency department.   Time call ended:  1310  I provided 11 minutes of  non face-to-face time during this encounter.    Gabriel Earing, FNP

## 2021-03-16 ENCOUNTER — Other Ambulatory Visit: Payer: Self-pay

## 2021-03-16 ENCOUNTER — Ambulatory Visit (INDEPENDENT_AMBULATORY_CARE_PROVIDER_SITE_OTHER): Payer: Medicare HMO

## 2021-03-16 ENCOUNTER — Encounter: Payer: Self-pay | Admitting: Family Medicine

## 2021-03-16 ENCOUNTER — Ambulatory Visit (INDEPENDENT_AMBULATORY_CARE_PROVIDER_SITE_OTHER): Payer: Medicare HMO | Admitting: Family Medicine

## 2021-03-16 DIAGNOSIS — F3341 Major depressive disorder, recurrent, in partial remission: Secondary | ICD-10-CM

## 2021-03-16 DIAGNOSIS — Z780 Asymptomatic menopausal state: Secondary | ICD-10-CM

## 2021-03-16 DIAGNOSIS — Z23 Encounter for immunization: Secondary | ICD-10-CM | POA: Diagnosis not present

## 2021-03-16 DIAGNOSIS — M170 Bilateral primary osteoarthritis of knee: Secondary | ICD-10-CM | POA: Diagnosis not present

## 2021-03-16 NOTE — Progress Notes (Signed)
Feels BP up due to   Subjective:  Patient ID: Erin Ewing, female    DOB: Aug 29, 1935  Age: 85 y.o. MRN: [id number]  CC: Medical Management of Chronic Issues   HPI Erin Ewing presents for recheck of depression. Much improved.  Needs new wheelchair. Using one from the facility that is worn out. Needs one of Erin Ewing own. Has severe osteoarthritis that prevents ambulation with the exception of transfers to bed and toilet.   Depression screen [facility] 2/9 03/16/2021 02/02/2021 02/02/2021  Decreased Interest 0 2 0  Down, Depressed, Hopeless 0 2 0  PHQ - 2 Score 0 4 0  Altered sleeping - 3 -  Tired, decreased energy - 3 -  Change in appetite - 3 -  Feeling bad or failure about yourself  - 3 -  Trouble concentrating - 3 -  Moving slowly or fidgety/restless - 1 -  Suicidal thoughts - 0 -  PHQ-9 Score - 20 -  Difficult doing work/chores - - -  Some recent data might be hidden    History Erin Ewing has a past medical history of Arthritis, Hypertension, Obesity, Polycythemia, Reflex sympathetic dystrophy (08/16/2012), and Subclinical hypothyroidism.   Erin Ewing has a past surgical history that includes ABDOMINAL AORTOGRAM W/LOWER EXTREMITY (N/A, 05/30/2017); PERIPHERAL VASCULAR INTERVENTION (Right, 05/30/2017); and PERIPHERAL VASCULAR ATHERECTOMY (Left, 05/30/2017).   Erin Ewing family history includes Heart disease in Erin Ewing father.Erin Ewing reports that Erin Ewing has been smoking cigarettes. Erin Ewing has never used smokeless tobacco. Erin Ewing reports that Erin Ewing does not drink alcohol and does not use drugs.    ROS Review of Systems  Constitutional: Negative.   HENT: Negative.    Eyes:  Negative for visual disturbance.  Respiratory:  Negative for shortness of breath.   Cardiovascular:  Negative for chest pain.  Gastrointestinal:  Negative for abdominal pain.  Musculoskeletal:  Negative for arthralgias.   Objective:  There were no vitals taken for this visit.  BP Readings from Last 3 Encounters:  03/07/21 (!) 167/79   02/02/21 130/77  08/05/20 124/83    Wt Readings from Last 3 Encounters:  03/07/21 186 lb 4.6 oz (84.5 kg)  08/05/20 185 lb 6.4 oz (84.1 kg)  04/02/20 179 lb (81.2 kg)     Physical Exam Constitutional:      General: Erin Ewing is not in acute distress.    Appearance: Erin Ewing is well-developed.  Cardiovascular:     Rate and Rhythm: Normal rate and regular rhythm.  Pulmonary:     Breath sounds: Normal breath sounds.  Musculoskeletal:     Comments: Wheelchair bound due to arthritis. Recent fall from [facility]. Had -rays of knees and hips showing arthritis is severe.  Skin:    General: Skin is warm and dry.  Neurological:     Mental Status: Erin Ewing is alert and oriented to person, place, and time.      Assessment & Plan:   Erin Ewing was seen today for medical management of chronic issues.  Diagnoses and all orders for this visit:  Arthritis of both knees -     DME Wheelchair manual  Post-menopausal -     DG [facility] DEXA; Future  Need for shingles vaccine -     Varicella-zoster vaccine IM (Shingrix)  Recurrent major depressive disorder, in partial remission (HCC)  Other orders -     Cancel: Varicella-zoster vaccine subcutaneous      I am having Erin Ewing maintain Erin Ewing (Diphenhydramine-APAP, sleep, (TYLENOL PM EXTRA STRENGTH PO)), Aspirin Low Dose, Calcium 600/Vitamin D3, clopidogrel, DULoxetine,  levothyroxine, olmesartan-hydrochlorothiazide, and gabapentin.  Allergies as of 03/16/2021       Reactions   Fish Allergy Anaphylaxis   Peanuts [peanut Oil] Anaphylaxis   Celebrex [celecoxib] Diarrhea   Diclofenac Diarrhea        Medication List        Accurate as of March 16, 2021  5:32 PM. If you have any questions, ask your nurse or doctor.          Aspirin Low Dose 81 MG chewable tablet Generic drug: aspirin TAKE (1) TABLET BY MOUTH ONCE DAILY.   Calcium 600/Vitamin D3 600-800 MG-UNIT Tabs Generic drug: Calcium Carb-Cholecalciferol TAKE (1) TABLET BY MOUTH TWICE  DAILY.   clopidogrel 75 MG tablet Commonly known as: PLAVIX Take 1 tablet (75 mg total) by mouth daily.   DULoxetine 60 MG capsule Commonly known as: Cymbalta Take 1 capsule (60 mg total) by mouth daily.   gabapentin 600 MG tablet Commonly known as: NEURONTIN Take 1 tablet (600 mg total) by mouth 2 (two) times daily.   levothyroxine 125 MCG tablet Commonly known as: SYNTHROID Take 1 tablet (125 mcg total) by mouth daily.   olmesartan-hydrochlorothiazide 40-12.5 MG tablet Commonly known as: BENICAR HCT Take 1 tablet by mouth daily.   TYLENOL PM EXTRA STRENGTH PO Take 1 tablet by mouth at bedtime as needed.               Durable Medical Equipment  (From admission, onward)           Start     Ordered   03/16/21 0000  DME Wheelchair manual       Comments: ostePatient suffers from osteoarthritis of the knees which impairs their ability to perform daily activities like bathing, dressing, feeding, grooming, and toileting in the home.  A walker will not resolve issue with performing activities of daily living. A wheelchair will allow patient to safely perform daily activities. Patient can safely propel the wheelchair in the home or has a caregiver who can provide assistance. Length of need Lifetime. Accessories: elevating leg rests (ELRs), wheel locks, extensions and anti-tippers.   03/16/21 1337             Follow-up: Return in about 3 months (around 06/15/2021).  Mechele Claude, M.D.

## 2021-04-07 ENCOUNTER — Other Ambulatory Visit: Payer: Self-pay | Admitting: Family Medicine

## 2021-04-07 MED ORDER — CALCIUM 600/VITAMIN D3 600-800 MG-UNIT PO TABS: ORAL_TABLET | ORAL | 5 refills | Status: DC

## 2021-04-08 ENCOUNTER — Other Ambulatory Visit: Payer: Self-pay | Admitting: *Deleted

## 2021-04-08 MED ORDER — CALCIUM 600/VITAMIN D3 600-800 MG-UNIT PO TABS: ORAL_TABLET | ORAL | 5 refills | Status: AC

## 2021-06-15 ENCOUNTER — Ambulatory Visit: Payer: Medicare HMO | Admitting: Family Medicine

## 2021-06-23 ENCOUNTER — Ambulatory Visit (INDEPENDENT_AMBULATORY_CARE_PROVIDER_SITE_OTHER): Payer: Medicare HMO | Admitting: Family Medicine

## 2021-06-23 ENCOUNTER — Encounter: Payer: Self-pay | Admitting: Family Medicine

## 2021-06-23 VITALS — BP 125/77 | HR 58 | Temp 97.2°F | Ht 67.0 in | Wt 201.2 lb

## 2021-06-23 DIAGNOSIS — I10 Essential (primary) hypertension: Secondary | ICD-10-CM

## 2021-06-23 DIAGNOSIS — R2689 Other abnormalities of gait and mobility: Secondary | ICD-10-CM | POA: Diagnosis not present

## 2021-06-23 DIAGNOSIS — E785 Hyperlipidemia, unspecified: Secondary | ICD-10-CM

## 2021-06-23 DIAGNOSIS — E039 Hypothyroidism, unspecified: Secondary | ICD-10-CM

## 2021-06-23 DIAGNOSIS — Z23 Encounter for immunization: Secondary | ICD-10-CM

## 2021-06-23 NOTE — Progress Notes (Signed)
Subjective:  Patient ID: Erin Ewing, female    DOB: 12-Oct-1935  Age: 85 y.o. MRN: [id number]  CC: Medical Management of Chronic Issues   HPI Erin Ewing presents for  follow-up of hypertension. Patient has no history of headache chest pain or shortness of breath or recent cough. Patient also denies symptoms of TIA such as focal numbness or weakness. Patient denies side effects from medication. States taking it regularly.   follow-up on  thyroid. The patient has a history of hypothyroidism for many years. It has been stable recently. Pt. denies any change in  voice, loss of hair, heat or cold intolerance. Energy level has been adequate to good. Patient denies constipation and diarrhea. No myxedema. Medication is as noted below. Verified that pt is taking it daily on an empty stomach. Well tolerated.  Pt. More dependent on wheel chair. Wants to walk, but afaid of falling. Staff at facility noote her gait is unsteady.   History Erin Ewing has a past medical history of Arthritis, Hypertension, Obesity, Polycythemia, Reflex sympathetic dystrophy (08/16/2012), and Subclinical hypothyroidism.   Erin Ewing has a past surgical history that includes ABDOMINAL AORTOGRAM W/LOWER EXTREMITY (N/A, 05/30/2017); PERIPHERAL VASCULAR INTERVENTION (Right, 05/30/2017); and PERIPHERAL VASCULAR ATHERECTOMY (Left, 05/30/2017).   Her family history includes Heart disease in her father.Erin Ewing reports that Erin Ewing has been smoking cigarettes. Erin Ewing has never used smokeless tobacco. Erin Ewing reports that Erin Ewing does not drink alcohol and does not use drugs.  Current Outpatient Medications on File Prior to Visit  Medication Sig Dispense Refill   ASPIRIN LOW DOSE 81 MG chewable tablet TAKE (1) TABLET BY MOUTH ONCE DAILY. 30 tablet 5   Calcium Carb-Cholecalciferol (CALCIUM 600/VITAMIN D3) 600-800 MG-UNIT TABS TAKE (1) TABLET BY MOUTH TWICE DAILY. 60 tablet 5   clopidogrel (PLAVIX) 75 MG tablet Take 1 tablet (75 mg total) by mouth daily.  90 tablet 3   Diphenhydramine-APAP, sleep, (TYLENOL PM EXTRA STRENGTH PO) Take 1 tablet by mouth at bedtime as needed.     DULoxetine (CYMBALTA) 60 MG capsule Take 1 capsule (60 mg total) by mouth daily. 90 capsule 3   gabapentin (NEURONTIN) 600 MG tablet Take 1 tablet (600 mg total) by mouth 2 (two) times daily. 60 tablet 5   levothyroxine (SYNTHROID) 125 MCG tablet Take 1 tablet (125 mcg total) by mouth daily. 90 tablet 3   olmesartan-hydrochlorothiazide (BENICAR HCT) 40-12.5 MG tablet Take 1 tablet by mouth daily. 90 tablet 3   No current facility-administered medications on file prior to visit.    ROS Review of Systems  Constitutional: Negative.   HENT: Negative.    Respiratory:  Negative for shortness of breath.   Cardiovascular:  Negative for chest pain.  Gastrointestinal:  Negative for abdominal pain.  Musculoskeletal:  Positive for arthralgias.   Objective:  BP 125/77    Pulse (!) 58    Temp (!) 97.2 F (36.2 C)    Ht '5\' 7"'  (1.702 m)    Wt 201 lb 3.2 oz (91.3 kg)    SpO2 97%    BMI 31.51 kg/m   BP Readings from Last 3 Encounters:  06/23/21 125/77  03/07/21 (!) 167/79  02/02/21 130/77    Wt Readings from Last 3 Encounters:  06/23/21 201 lb 3.2 oz (91.3 kg)  03/07/21 186 lb 4.6 oz (84.5 kg)  08/05/20 185 lb 6.4 oz (84.1 kg)     Physical Exam Constitutional:      General: Erin Ewing is not in acute distress.    Appearance:  Erin Ewing is well-developed.  Cardiovascular:     Rate and Rhythm: Normal rate and regular rhythm.  Pulmonary:     Breath sounds: Normal breath sounds.  Abdominal:     Tenderness: There is no abdominal tenderness.  Musculoskeletal:        General: Normal range of motion.     Comments: In wheelchair today   Skin:    General: Skin is warm and dry.  Neurological:     Mental Status: Erin Ewing is alert and oriented to person, place, and time.      Assessment & Plan:   Erin Ewing was seen today for medical management of chronic issues.  Diagnoses and all  orders for this visit:  Hypothyroidism, unspecified type -     TSH + free T4  Hyperlipidemia, unspecified hyperlipidemia type -     Lipid panel  Primary hypertension -     CBC with Differential/Platelet -     CMP14+EGFR  Impairment of balance  Allergies as of 06/23/2021       Reactions   Fish Allergy Anaphylaxis   Peanuts [peanut Oil] Anaphylaxis   Celebrex [celecoxib] Diarrhea   Diclofenac Diarrhea        Medication List        Accurate as of June 23, 2021 11:10 AM. If you have any questions, ask your nurse or doctor.          Aspirin Low Dose 81 MG chewable tablet Generic drug: aspirin TAKE (1) TABLET BY MOUTH ONCE DAILY.   Calcium 600/Vitamin D3 600-20 MG-MCG Tabs Generic drug: Calcium Carb-Cholecalciferol TAKE (1) TABLET BY MOUTH TWICE DAILY.   clopidogrel 75 MG tablet Commonly known as: PLAVIX Take 1 tablet (75 mg total) by mouth daily.   DULoxetine 60 MG capsule Commonly known as: Cymbalta Take 1 capsule (60 mg total) by mouth daily.   gabapentin 600 MG tablet Commonly known as: NEURONTIN Take 1 tablet (600 mg total) by mouth 2 (two) times daily.   levothyroxine 125 MCG tablet Commonly known as: SYNTHROID Take 1 tablet (125 mcg total) by mouth daily.   olmesartan-hydrochlorothiazide 40-12.5 MG tablet Commonly known as: BENICAR HCT Take 1 tablet by mouth daily.   TYLENOL PM EXTRA STRENGTH PO Take 1 tablet by mouth at bedtime as needed.        No orders of the defined types were placed in this encounter.   Physical therapy for Balance ordered through ALF.   Follow-up: Return in about 6 months (around 12/22/2021).  Claretta Fraise, M.D.

## 2021-06-23 NOTE — Addendum Note (Signed)
Addended by: Adella Hare B on: 06/23/2021 11:28 AM   Modules accepted: Orders

## 2021-06-24 LAB — CMP14+EGFR
ALT: 17 IU/L (ref 0–32)
AST: 17 IU/L (ref 0–40)
Albumin/Globulin Ratio: 1.9 (ref 1.2–2.2)
Albumin: 4.3 g/dL (ref 3.6–4.6)
Alkaline Phosphatase: 108 IU/L (ref 44–121)
BUN/Creatinine Ratio: 19 (ref 12–28)
BUN: 15 mg/dL (ref 8–27)
Bilirubin Total: 0.3 mg/dL (ref 0.0–1.2)
CO2: 22 mmol/L (ref 20–29)
Calcium: 10.1 mg/dL (ref 8.7–10.3)
Chloride: 98 mmol/L (ref 96–106)
Creatinine, Ser: 0.79 mg/dL (ref 0.57–1.00)
Globulin, Total: 2.3 g/dL (ref 1.5–4.5)
Glucose: 90 mg/dL (ref 70–99)
Potassium: 4.5 mmol/L (ref 3.5–5.2)
Sodium: 137 mmol/L (ref 134–144)
Total Protein: 6.6 g/dL (ref 6.0–8.5)
eGFR: 74 mL/min/{1.73_m2} (ref >59)

## 2021-06-24 LAB — LIPID PANEL
Chol/HDL Ratio: 3.5 ratio (ref 0.0–4.4)
Cholesterol, Total: 165 mg/dL (ref 100–199)
HDL: 47 mg/dL (ref >39)
LDL Chol Calc (NIH): 84 mg/dL (ref 0–99)
Triglycerides: 202 mg/dL — ABNORMAL HIGH (ref 0–149)
VLDL Cholesterol Cal: 34 mg/dL (ref 5–40)

## 2021-06-24 LAB — CBC WITH DIFFERENTIAL/PLATELET
Basophils Absolute: 0.1 10*3/uL (ref 0.0–0.2)
Basos: 1 %
EOS (ABSOLUTE): 0.2 10*3/uL (ref 0.0–0.4)
Eos: 2 %
Hematocrit: 48.7 % — ABNORMAL HIGH (ref 34.0–46.6)
Hemoglobin: 15.8 g/dL (ref 11.1–15.9)
Immature Grans (Abs): 0 10*3/uL (ref 0.0–0.1)
Immature Granulocytes: 0 %
Lymphocytes Absolute: 1.6 10*3/uL (ref 0.7–3.1)
Lymphs: 16 %
MCH: 29.6 pg (ref 26.6–33.0)
MCHC: 32.4 g/dL (ref 31.5–35.7)
MCV: 91 fL (ref 79–97)
Monocytes Absolute: 0.7 10*3/uL (ref 0.1–0.9)
Monocytes: 7 %
Neutrophils Absolute: 7.5 10*3/uL — ABNORMAL HIGH (ref 1.4–7.0)
Neutrophils: 74 %
Platelets: 321 10*3/uL (ref 150–450)
RBC: 5.34 x10E6/uL — ABNORMAL HIGH (ref 3.77–5.28)
RDW: 13.4 % (ref 11.7–15.4)
WBC: 10 10*3/uL (ref 3.4–10.8)

## 2021-06-24 LAB — TSH+FREE T4
Free T4: 1.28 ng/dL (ref 0.82–1.77)
TSH: 2.7 u[IU]/mL (ref 0.450–4.500)

## 2021-06-28 NOTE — Progress Notes (Signed)
Hello Leanda, ? ?Your lab result is normal and/or stable.Some minor variations that are not significant are commonly marked abnormal, but do not represent any medical problem for you. ? ?Best regards, ?Matisha Termine, M.D.

## 2021-08-04 ENCOUNTER — Telehealth: Payer: Self-pay | Admitting: Family Medicine

## 2021-08-04 NOTE — Telephone Encounter (Signed)
RETURNED CALL FOR CLARIFICATION, SILVIA GONE FOR THE DAY

## 2021-08-09 NOTE — Telephone Encounter (Signed)
RETURNED CALL, SILVIA GONE FOR THE DAY

## 2021-08-10 ENCOUNTER — Other Ambulatory Visit: Payer: Self-pay | Admitting: Family Medicine

## 2021-08-10 ENCOUNTER — Telehealth: Payer: Self-pay | Admitting: Family Medicine

## 2021-08-10 DIAGNOSIS — E039 Hypothyroidism, unspecified: Secondary | ICD-10-CM

## 2021-08-10 NOTE — Telephone Encounter (Signed)
Order faxed.

## 2021-08-10 NOTE — Telephone Encounter (Signed)
Done. Thanks, WS 

## 2021-08-10 NOTE — Telephone Encounter (Signed)
Sunday Spillers called from [facility] stating that she needs PCP to send [facility] order to them for pt to have TSH and BMP checked. Says they will have her do lab work at the [facility] in [place].

## 2021-08-10 NOTE — Telephone Encounter (Signed)
Faxed to [facility]

## 2021-08-12 LAB — BMP8+EGFR
BUN/Creatinine Ratio: 20 (ref 12–28)
BUN: 16 mg/dL (ref 8–27)
CO2: 25 mmol/L (ref 20–29)
Calcium: 10 mg/dL (ref 8.7–10.3)
Chloride: 100 mmol/L (ref 96–106)
Creatinine, Ser: 0.79 mg/dL (ref 0.57–1.00)
Glucose: 80 mg/dL (ref 70–99)
Potassium: 4.7 mmol/L (ref 3.5–5.2)
Sodium: 140 mmol/L (ref 134–144)
eGFR: 74 mL/min/{1.73_m2} (ref >59)

## 2021-08-12 LAB — TSH+FREE T4
Free T4: 1.3 ng/dL (ref 0.82–1.77)
TSH: 3.9 u[IU]/mL (ref 0.450–4.500)

## 2021-08-12 NOTE — Progress Notes (Signed)
Called ALF, no answer

## 2021-08-18 ENCOUNTER — Encounter: Payer: Self-pay | Admitting: *Deleted

## 2021-10-13 ENCOUNTER — Encounter: Payer: Self-pay | Admitting: Nurse Practitioner

## 2021-10-13 ENCOUNTER — Ambulatory Visit (INDEPENDENT_AMBULATORY_CARE_PROVIDER_SITE_OTHER): Payer: Medicare HMO

## 2021-10-13 ENCOUNTER — Ambulatory Visit (INDEPENDENT_AMBULATORY_CARE_PROVIDER_SITE_OTHER): Payer: Medicare HMO | Admitting: Nurse Practitioner

## 2021-10-13 VITALS — BP 152/82 | HR 54 | Temp 98.7°F | Ht 67.0 in | Wt 201.0 lb

## 2021-10-13 DIAGNOSIS — M79641 Pain in right hand: Secondary | ICD-10-CM | POA: Diagnosis not present

## 2021-10-13 MED ORDER — DICLOFENAC SODIUM 1 % EX GEL: 2.0000 g | Freq: Four times a day (QID) | CUTANEOUS | 1 refills | Status: DC

## 2021-10-13 NOTE — Patient Instructions (Signed)
Hand Pain °Many things can cause hand pain. Some common causes are: °An injury. °Repeating the same movement with your hand over and over (overuse). °Osteoporosis. °Arthritis. °Lumps in the tendons or joints of the hand and wrist (ganglion cysts). °Nerve compression syndromes (carpal tunnel syndrome). °Inflammation of the tendons (tendinitis). °Infection. °Follow these instructions at home: °Pay attention to any changes in your symptoms. Take these actions to help with your discomfort: °Managing pain, stiffness, and swelling ° °Take over-the-counter and prescription medicines only as told by your health care provider. °Wear a hand splint or support as told by your health care provider. °If directed, put ice on the affected area: °Put ice in a plastic bag. °Place a towel between your skin and the bag. °Leave the ice on for 20 minutes, 2-3 times a day. °Activity °Take breaks from repetitive activity often. °Avoid activities that make your pain worse. °Minimize stress on your hands and wrists as much as possible. °Do stretches or exercises as told by your health care provider. °Do not do activities that make your pain worse. °Contact a health care provider if: °Your pain does not get better after a few days of self-care. °Your pain gets worse. °Your pain affects your ability to do your daily activities. °Get help right away if: °Your hand becomes warm, red, or swollen. °Your hand is numb or tingling. °Your hand is extremely swollen or deformed. °Your hand or fingers turn white or blue. °You cannot move your hand, wrist, or fingers. °Summary °Many things can cause hand pain. °Contact your health care provider if your pain does not get better after a few days of self care. °Minimize stress on your hands and wrists as much as possible. °Do not do activities that make your pain worse. °This information is not intended to replace advice given to you by your health care provider. Make sure you discuss any questions you have  with your health care provider. °Document Revised: 10/15/2020 Document Reviewed: 10/15/2020 °Elsevier Patient Education © 2022 [employer] ° °

## 2021-10-13 NOTE — Progress Notes (Signed)
? ?Acute Office Visit ? ?Subjective:  ? ? Patient ID: Erin Ewing, female    DOB: 1936/01/01, 86 y.o.   MRN: [id number] ? ?Chief Complaint  ?Patient presents with  ? right hand pain  ? ? ?Hand Pain  ?The incident occurred 3 to 5 days ago. The incident occurred at school. There was no injury mechanism. The pain is present in the right hand. The quality of the pain is described as aching. Radiates to: wrist. The pain is at a severity of 5/10. The pain is moderate. The pain has been Constant since the incident. Pertinent negatives include no chest pain, numbness or tingling. The symptoms are aggravated by movement. She has tried acetaminophen for the symptoms. The treatment provided mild relief.  ? ? ?Past Medical History:  ?Diagnosis Date  ? Arthritis   ? kneew  ? Hypertension   ? Obesity   ? Polycythemia   ? secondary to cigarette use  ? Reflex sympathetic dystrophy 08/16/2012  ? Subclinical hypothyroidism   ? ? ?Past Surgical History:  ?Procedure Laterality Date  ? ABDOMINAL AORTOGRAM W/LOWER EXTREMITY N/A 05/30/2017  ? Procedure: ABDOMINAL AORTOGRAM W/LOWER EXTREMITY;  Surgeon: Serafina Mitchell, MD;  Location: [facility];  Service: Cardiovascular;  Laterality: N/A;  ? PERIPHERAL VASCULAR ATHERECTOMY Left 05/30/2017  ? Procedure: PERIPHERAL VASCULAR ATHERECTOMY;  Surgeon: Serafina Mitchell, MD;  Location: [facility];  Service: Cardiovascular;  Laterality: Left;  superficial femoral  ? PERIPHERAL VASCULAR INTERVENTION Right 05/30/2017  ? Procedure: PERIPHERAL VASCULAR INTERVENTION;  Surgeon: Serafina Mitchell, MD;  Location: [facility];  Service: Cardiovascular;  Laterality: Right;  Common iliac  ? ? ?Family History  ?Problem Relation Age of Onset  ? Heart disease Father   ? ? ?Social History  ? ?Socioeconomic History  ? Marital status: Widowed  ?  Spouse name: Not on file  ? Number of children: Not on file  ? Years of education: Not on file  ? Highest education level: Not on file   ?Occupational History  ? Not on file  ?Tobacco Use  ? Smoking status: Every Day  ?  Types: Cigarettes  ? Smokeless tobacco: Never  ? Tobacco comments:  ?  4-5 cigarettes per day  ?Substance and Sexual Activity  ? Alcohol use: No  ? Drug use: No  ? Sexual activity: Not on file  ?Other Topics Concern  ? Not on file  ?Social History Narrative  ? Not on file  ? ?Social Determinants of Health  ? ?Financial Resource Strain: Not on file  ?Food Insecurity: Not on file  ?Transportation Needs: Not on file  ?Physical Activity: Not on file  ?Stress: Not on file  ?Social Connections: Not on file  ?Intimate Partner Violence: Not on file  ? ? ?Outpatient Medications Prior to Visit  ?Medication Sig Dispense Refill  ? ASPIRIN LOW DOSE 81 MG chewable tablet TAKE (1) TABLET BY MOUTH ONCE DAILY. 30 tablet 5  ? Calcium Carb-Cholecalciferol (CALCIUM 600/VITAMIN D3) 600-800 MG-UNIT TABS TAKE (1) TABLET BY MOUTH TWICE DAILY. 60 tablet 5  ? clopidogrel (PLAVIX) 75 MG tablet Take 1 tablet (75 mg total) by mouth daily. 90 tablet 3  ? DULoxetine (CYMBALTA) 60 MG capsule Take 1 capsule (60 mg total) by mouth daily. 90 capsule 3  ? gabapentin (NEURONTIN) 600 MG tablet Take 1 tablet (600 mg total) by mouth 2 (two) times daily. 60 tablet 5  ? levothyroxine (SYNTHROID) 125 MCG tablet Take 1 tablet (125  mcg total) by mouth daily. 90 tablet 3  ? olmesartan-hydrochlorothiazide (BENICAR HCT) 40-12.5 MG tablet Take 1 tablet by mouth daily. 90 tablet 3  ? Diphenhydramine-APAP, sleep, (TYLENOL PM EXTRA STRENGTH PO) Take 1 tablet by mouth at bedtime as needed. (Patient not taking: Reported on 10/13/2021)    ? ?No facility-administered medications prior to visit.  ? ? ?Allergies  ?Allergen Reactions  ? Fish Allergy Anaphylaxis  ? Peanuts [Peanut Oil] Anaphylaxis  ? Celebrex [Celecoxib] Diarrhea  ? Diclofenac Diarrhea  ? ? ?Review of Systems  ?Constitutional: Negative.   ?HENT: Negative.    ?Eyes: Negative.   ?Respiratory: Negative.    ?Cardiovascular:  Negative.  Negative for chest pain.  ?Gastrointestinal: Negative.   ?Musculoskeletal:  Positive for arthralgias and joint swelling.  ?Skin:  Negative for rash.  ?Neurological:  Negative for tingling and numbness.  ?All other systems reviewed and are negative. ? ?   ?Objective:  ?  ?Physical Exam ?Vitals and nursing note reviewed. Exam conducted with a chaperone present (Caregiver).  ?Constitutional:   ?   Appearance: She is normal weight.  ?HENT:  ?   Head: Normocephalic.  ?   Right Ear: External ear normal.  ?   Left Ear: External ear normal.  ?   Nose: Nose normal.  ?Eyes:  ?   Conjunctiva/sclera: Conjunctivae normal.  ?Cardiovascular:  ?   Rate and Rhythm: Normal rate and regular rhythm.  ?   Pulses: Normal pulses.  ?   Heart sounds: Normal heart sounds.  ?Pulmonary:  ?   Effort: Pulmonary effort is normal.  ?   Breath sounds: Normal breath sounds.  ?Abdominal:  ?   General: Bowel sounds are normal.  ?Musculoskeletal:  ?   Right hand: Swelling, deformity and tenderness present. Decreased range of motion. Decreased strength. Normal capillary refill. Normal pulse.  ?Skin: ?   General: Skin is dry.  ?   Findings: No rash.  ?Neurological:  ?   Mental Status: She is oriented to person, place, and time.  ? ? ?BP (!) 152/82   Pulse (!) 54   Temp 98.7 ?F (37.1 ?C)   Ht '5\' 7"'  (1.702 m)   Wt 201 lb (91.2 kg)   SpO2 95%   BMI 31.48 kg/m?  ?Wt Readings from Last 3 Encounters:  ?10/13/21 201 lb (91.2 kg)  ?06/23/21 201 lb 3.2 oz (91.3 kg)  ?03/07/21 186 lb 4.6 oz (84.5 kg)  ? ? ?Health Maintenance Due  ?Topic Date Due  ? COVID-19 Vaccine (1) Never done  ? ? ?There are no preventive care reminders to display for this patient. ? ? ?Lab Results  ?Component Value Date  ? TSH 3.900 08/11/2021  ? ?Lab Results  ?Component Value Date  ? WBC 10.0 06/23/2021  ? HGB 15.8 06/23/2021  ? HCT 48.7 (H) 06/23/2021  ? MCV 91 06/23/2021  ? PLT 321 06/23/2021  ? ?Lab Results  ?Component Value Date  ? NA 140 08/11/2021  ? K 4.7 08/11/2021   ? CO2 25 08/11/2021  ? GLUCOSE 80 08/11/2021  ? BUN 16 08/11/2021  ? CREATININE 0.79 08/11/2021  ? BILITOT 0.3 06/23/2021  ? ALKPHOS 108 06/23/2021  ? AST 17 06/23/2021  ? ALT 17 06/23/2021  ? PROT 6.6 06/23/2021  ? ALBUMIN 4.3 06/23/2021  ? CALCIUM 10.0 08/11/2021  ? EGFR 74 08/11/2021  ? ?Lab Results  ?Component Value Date  ? CHOL 165 06/23/2021  ? ?Lab Results  ?Component Value Date  ? HDL 47 06/23/2021  ? ?  Lab Results  ?Component Value Date  ? [place] 84 06/23/2021  ? ?Lab Results  ?Component Value Date  ? TRIG 202 (H) 06/23/2021  ? ?Lab Results  ?Component Value Date  ? CHOLHDL 3.5 06/23/2021  ? ?Lab Results  ?Component Value Date  ? HGBA1C 5.7 (H) 04/26/2017  ? ? ?   ?Assessment & Plan:  ?Patient presents with symptoms similar to arthritis.  Tenderness, swelling and discomfort right hand and right wrist.  Encourage patient to rest hand ?Take medication as prescribed ?Completed right hand x-ray results pending ?Tylenol Extra Strength as needed for pain ?Voltaren topical gel ?Follow-up with worsening unresolved symptoms ?Problem List Items Addressed This Visit   ?None ?Visit Diagnoses   ? ? Hand pain, right    -  Primary  ? Relevant Medications  ? diclofenac Sodium (VOLTAREN) 1 % GEL  ? Other Relevant Orders  ? DG Hand Complete Right  ? ?  ? ? ? ?Meds ordered this encounter  ?Medications  ? diclofenac Sodium (VOLTAREN) 1 % GEL  ?  Sig: Apply 2 g topically 4 (four) times daily.  ?  Dispense:  100 g  ?  Refill:  1  ?  Order Specific Question:   Supervising Provider  ?  AnswerClaretta Fraise [[id number]]  ? ? ? ?Ivy Lynn, NP ? ?

## 2021-10-18 ENCOUNTER — Other Ambulatory Visit: Payer: Self-pay | Admitting: Nurse Practitioner

## 2021-10-18 ENCOUNTER — Telehealth: Payer: Self-pay | Admitting: Family Medicine

## 2021-10-18 DIAGNOSIS — M79641 Pain in right hand: Secondary | ICD-10-CM

## 2021-10-18 MED ORDER — DICLOFENAC SODIUM 1 % EX GEL: 2.0000 g | Freq: Four times a day (QID) | CUTANEOUS | 1 refills | Status: DC

## 2021-10-18 NOTE — Telephone Encounter (Signed)
Orders have been signed and sent back

## 2021-11-18 ENCOUNTER — Encounter: Payer: Self-pay | Admitting: Family

## 2021-11-18 ENCOUNTER — Ambulatory Visit (INDEPENDENT_AMBULATORY_CARE_PROVIDER_SITE_OTHER): Payer: Medicare HMO | Admitting: Family

## 2021-11-18 DIAGNOSIS — H109 Unspecified conjunctivitis: Secondary | ICD-10-CM

## 2021-11-18 MED ORDER — POLYMYXIN B-TRIMETHOPRIM 10000-0.1 UNIT/ML-% OP SOLN: 1.0000 [drp] | Freq: Four times a day (QID) | OPHTHALMIC | 0 refills | Status: DC

## 2021-11-18 NOTE — Progress Notes (Signed)
? ?Virtual Visit  Note ?Due to COVID-19 pandemic this visit was conducted virtually. This visit type was conducted due to national recommendations for restrictions regarding the COVID-19 Pandemic (e.g. social distancing, sheltering in place) in an effort to limit this patient's exposure and mitigate transmission in our community. All issues noted in this document were discussed and addressed.  A physical exam was not performed with this format. ? ?I connected with Erin Ewing on 11/18/21 at 12:03 pm  by telephone and verified that I am speaking with the correct person using two identifiers. Erin Ewing is currently located at [facility] and nurse is currently with her during visit. The provider, Jannifer Rodney, FNP is located in their office at time of visit. ? ?I discussed the limitations, risks, security and privacy concerns of performing an evaluation and management service by telephone and the availability of in person appointments. I also discussed with the patient that there may be a patient responsible charge related to this service. The patient expressed understanding and agreed to proceed. ? ?[name],you are scheduled for a virtual visit with your provider today.   ? ?Just as we do with appointments in the office, we must obtain your consent to participate.  Your consent will be active for this visit and any virtual visit you may have with one of our providers in the next 365 days.   ? ?If you have a MyChart account, I can also send a copy of this consent to you electronically.  All virtual visits are billed to your insurance company just like a traditional visit in the office.  As this is a virtual visit, video technology does not allow for your provider to perform a traditional examination.  This may limit your provider's ability to fully assess your condition.  If your provider identifies any concerns that need to be evaluated in person or the need to arrange testing such as labs, EKG, etc, we will  make arrangements to do so.   ? ?Although advances in technology are sophisticated, we cannot ensure that it will always work on either your end or our end.  If the connection with a video visit is poor, we may have to switch to a telephone visit.  With either a video or telephone visit, we are not always able to ensure that we have a secure connection.   I need to obtain your verbal consent now.   Are you willing to proceed with your visit today?  ? ?[name] declined to provide consent on 11/18/2021 for a virtual visit.  An in person visit will be arranged.  ? ? ?Jannifer Rodney, FNP ?11/18/2021  12:07 PM ? ? ? ?History and Present Illness: ? ?Conjunctivitis  ?The current episode started yesterday. The onset was sudden. The problem has been gradually worsening. Associated symptoms include eye itching, photophobia, eye discharge, eye pain and eye redness. Pertinent negatives include no decreased vision, no double vision, no ear pain, no headaches, no hearing loss and no mouth sores. The eye pain is moderate. Both eyes are affected.  ? ? ? ?Review of Systems  ?HENT:  Negative for ear pain, hearing loss and mouth sores.   ?Eyes:  Positive for photophobia, pain, discharge, redness and itching. Negative for double vision.  ?Neurological:  Negative for headaches.  ? ? ?Observations/Objective: ?No SOB or distress noted  ? ?Assessment and Plan: ?1. Bacterial conjunctivitis ?Good hand hygiene ?Warm compress ?New pillow case ?Polytrim as needed ?Follow up if symptoms  worsen or do not improve  ?- trimethoprim-polymyxin b (POLYTRIM) ophthalmic solution; Place 1 drop into the left eye every 6 (six) hours.  Dispense: 10 mL; Refill: 0 ? ?  ?I discussed the assessment and treatment plan with the patient. The patient was provided an opportunity to ask questions and all were answered. The patient agreed with the plan and demonstrated an understanding of the instructions. ?  ?The patient was advised to call back or seek an  in-person evaluation if the symptoms worsen or if the condition fails to improve as anticipated. ? ?The above assessment and management plan was discussed with the patient. The patient verbalized understanding of and has agreed to the management plan. Patient is aware to call the clinic if symptoms persist or worsen. Patient is aware when to return to the clinic for a follow-up visit. Patient educated on when it is appropriate to go to the emergency department.  ? ?Time call ended:  12:14 pm  ? ?I provided 11 minutes of  non face-to-face time during this encounter. ? ? ? ?Jannifer Rodney, FNP ? ? ?

## 2021-12-08 ENCOUNTER — Telehealth: Payer: Self-pay | Admitting: Family Medicine

## 2021-12-08 NOTE — Telephone Encounter (Signed)
7 days

## 2021-12-08 NOTE — Telephone Encounter (Signed)
Drops where sent in by [facility].  She is off today. Will send to PCP to advise

## 2021-12-08 NOTE — Telephone Encounter (Signed)
Pharmacy aware

## 2021-12-17 ENCOUNTER — Other Ambulatory Visit: Payer: Self-pay | Admitting: Family Medicine

## 2021-12-17 DIAGNOSIS — M79641 Pain in right hand: Secondary | ICD-10-CM

## 2021-12-22 ENCOUNTER — Ambulatory Visit: Payer: Medicare HMO

## 2021-12-22 ENCOUNTER — Ambulatory Visit: Payer: Medicare HMO | Admitting: Family Medicine

## 2022-01-19 ENCOUNTER — Ambulatory Visit: Payer: Medicare HMO | Admitting: Nurse Practitioner

## 2022-01-19 ENCOUNTER — Encounter: Payer: Self-pay | Admitting: Family Medicine

## 2022-02-14 ENCOUNTER — Encounter: Payer: Self-pay | Admitting: Family Medicine

## 2022-02-14 ENCOUNTER — Ambulatory Visit (INDEPENDENT_AMBULATORY_CARE_PROVIDER_SITE_OTHER): Payer: Medicare HMO | Admitting: Family Medicine

## 2022-02-14 VITALS — BP 134/87 | HR 79 | Temp 98.7°F

## 2022-02-14 DIAGNOSIS — E785 Hyperlipidemia, unspecified: Secondary | ICD-10-CM

## 2022-02-14 DIAGNOSIS — E039 Hypothyroidism, unspecified: Secondary | ICD-10-CM | POA: Diagnosis not present

## 2022-02-14 DIAGNOSIS — F331 Major depressive disorder, recurrent, moderate: Secondary | ICD-10-CM | POA: Diagnosis not present

## 2022-02-14 DIAGNOSIS — I10 Essential (primary) hypertension: Secondary | ICD-10-CM | POA: Diagnosis not present

## 2022-02-14 MED ORDER — DULOXETINE HCL 60 MG PO CPEP: 60.0000 mg | ORAL_CAPSULE | Freq: Every day | ORAL | 3 refills | Status: DC

## 2022-02-14 MED ORDER — OLMESARTAN MEDOXOMIL-HCTZ 40-12.5 MG PO TABS: 1.0000 | ORAL_TABLET | Freq: Every day | ORAL | 3 refills | Status: DC

## 2022-02-14 MED ORDER — LEVOTHYROXINE SODIUM 125 MCG PO TABS: 125.0000 ug | ORAL_TABLET | Freq: Every day | ORAL | 3 refills | Status: DC

## 2022-02-14 NOTE — Progress Notes (Signed)
Subjective:  Patient ID: Erin Ewing, female    DOB: 08-Jul-1936  Age: 86 y.o. MRN: [id number]  CC: Medical Management of Chronic Issues   HPI Erin Ewing presents for  follow-up on  thyroid. The patient has a history of hypothyroidism for many years. It has been stable recently. Pt. denies any change in  voice, loss of hair, heat or cold intolerance. Energy level has been adequate to good. Patient denies constipation and diarrhea. No myxedema. Medication is as noted below. Verified that pt is taking it daily on an empty stomach. Well tolerated.   presents for  follow-up of hypertension. Patient has no history of headache chest pain or shortness of breath or recent cough. Patient also denies symptoms of TIA such as focal numbness or weakness. Patient denies side effects from medication. States taking it regularly.   LEgs swell a lot. She is in bed a lot. Has a hard time elevating legs to stop swellling.      02/14/2022   12:56 PM 06/23/2021   10:43 AM 06/23/2021   10:36 AM  Depression screen PHQ 2/9  Decreased Interest 0 0 0  Down, Depressed, Hopeless 0 0 0  PHQ - 2 Score 0 0 0  Altered sleeping  1   Tired, decreased energy  2   Change in appetite  0   Feeling bad or failure about yourself   0   Trouble concentrating  0   Moving slowly or fidgety/restless  0   Suicidal thoughts  0   PHQ-9 Score  3   Difficult doing work/chores  Not difficult at all     History Erin Ewing has a past medical history of Arthritis, Hypertension, Obesity, Polycythemia, Reflex sympathetic dystrophy (08/16/2012), and Subclinical hypothyroidism.   She has a past surgical history that includes ABDOMINAL AORTOGRAM W/LOWER EXTREMITY (N/A, 05/30/2017); PERIPHERAL VASCULAR INTERVENTION (Right, 05/30/2017); and PERIPHERAL VASCULAR ATHERECTOMY (Left, 05/30/2017).   Her family history includes Heart disease in her father.She reports that she has been smoking cigarettes. She has never used smokeless  tobacco. She reports that she does not drink alcohol and does not use drugs.    ROS Review of Systems  Constitutional: Negative.   HENT: Negative.    Eyes:  Negative for visual disturbance.  Respiratory:  Negative for shortness of breath.   Cardiovascular:  Negative for chest pain.  Gastrointestinal:  Negative for abdominal pain.  Musculoskeletal:  Negative for arthralgias.  Hematological:  Negative for adenopathy. Bruises/bleeds easily.    Objective:  BP 134/87   Pulse 79   Temp 98.7 F (37.1 C)   BP Readings from Last 3 Encounters:  02/14/22 134/87  10/13/21 (!) 152/82  06/23/21 125/77    Wt Readings from Last 3 Encounters:  10/13/21 201 lb (91.2 kg)  06/23/21 201 lb 3.2 oz (91.3 kg)  03/07/21 186 lb 4.6 oz (84.5 kg)     Physical Exam Constitutional:      General: She is not in acute distress.    Appearance: She is well-developed.  HENT:     Head: Normocephalic and atraumatic.  Eyes:     Conjunctiva/sclera: Conjunctivae normal.     Pupils: Pupils are equal, round, and reactive to light.  Neck:     Thyroid: No thyromegaly.  Cardiovascular:     Rate and Rhythm: Normal rate and regular rhythm.     Heart sounds: Normal heart sounds. No murmur heard. Pulmonary:     Effort: Pulmonary effort is normal. No respiratory distress.  Breath sounds: Normal breath sounds. No wheezing or rales.  Abdominal:     General: Bowel sounds are normal. There is no distension.     Palpations: Abdomen is soft.     Tenderness: There is no abdominal tenderness.  Musculoskeletal:        General: No tenderness.     Cervical back: Normal range of motion and neck supple.     Right lower leg: Edema (2+) present.     Left lower leg: Edema (2+) present.     Comments: WC bound. Legs weak due to arthritis of knees & pain  Lymphadenopathy:     Cervical: No cervical adenopathy.  Skin:    General: Skin is warm and dry.  Neurological:     Mental Status: She is alert and oriented to  person, place, and time.  Psychiatric:        Behavior: Behavior normal.        Thought Content: Thought content normal.        Judgment: Judgment normal.       Assessment & Plan:   Erin Ewing was seen today for medical management of chronic issues.  Diagnoses and all orders for this visit:  Hypothyroidism, unspecified type -     TSH + free T4 -     levothyroxine (SYNTHROID) 125 MCG tablet; Take 1 tablet (125 mcg total) by mouth daily.  Hyperlipidemia, unspecified hyperlipidemia type -     Lipid panel  Primary hypertension -     CBC with Differential/Platelet -     CMP14+EGFR -     olmesartan-hydrochlorothiazide (BENICAR HCT) 40-12.5 MG tablet; Take 1 tablet by mouth daily.  Moderate episode of recurrent major depressive disorder (HCC) -     DULoxetine (CYMBALTA) 60 MG capsule; Take 1 capsule (60 mg total) by mouth daily.       I have discontinued Shametra [place]. [place]'s clopidogrel. I am also having her maintain her (Diphenhydramine-APAP, sleep, (TYLENOL PM EXTRA STRENGTH PO)), Aspirin Low Dose, gabapentin, Calcium 600/Vitamin D3, trimethoprim-polymyxin b, diclofenac Sodium, DULoxetine, levothyroxine, and olmesartan-hydrochlorothiazide.  Allergies as of 02/14/2022       Reactions   Fish Allergy Anaphylaxis   Peanuts [peanut Oil] Anaphylaxis   Celebrex [celecoxib] Diarrhea   Diclofenac Diarrhea        Medication List        Accurate as of February 14, 2022  1:34 PM. If you have any questions, ask your nurse or doctor.          STOP taking these medications    clopidogrel 75 MG tablet Commonly known as: PLAVIX Stopped by: Claretta Fraise, MD       TAKE these medications    Aspirin Low Dose 81 MG chewable tablet Generic drug: aspirin TAKE (1) TABLET BY MOUTH ONCE DAILY.   Calcium 600/Vitamin D3 600-20 MG-MCG Tabs Generic drug: Calcium Carb-Cholecalciferol TAKE (1) TABLET BY MOUTH TWICE DAILY.   diclofenac Sodium 1 % Gel Commonly known as:  VOLTAREN APPLY (2 GRAMS) TOPICALLY FOUR TIMES DAILY.   DULoxetine 60 MG capsule Commonly known as: Cymbalta Take 1 capsule (60 mg total) by mouth daily.   gabapentin 600 MG tablet Commonly known as: NEURONTIN Take 1 tablet (600 mg total) by mouth 2 (two) times daily.   levothyroxine 125 MCG tablet Commonly known as: SYNTHROID Take 1 tablet (125 mcg total) by mouth daily.   olmesartan-hydrochlorothiazide 40-12.5 MG tablet Commonly known as: BENICAR HCT Take 1 tablet by mouth daily.   trimethoprim-polymyxin b ophthalmic solution  Commonly known as: Polytrim Place 1 drop into the left eye every 6 (six) hours.   TYLENOL PM EXTRA STRENGTH PO Take 1 tablet by mouth at bedtime as needed.       Hospital bed should help with swelling by helping to elevate feet.   Follow-up: Return in about 6 months (around 08/17/2022) for Hypothyroidism.  Claretta Fraise, M.D.

## 2022-02-15 ENCOUNTER — Other Ambulatory Visit: Payer: Self-pay | Admitting: *Deleted

## 2022-02-15 ENCOUNTER — Other Ambulatory Visit: Payer: Self-pay | Admitting: Family Medicine

## 2022-02-15 DIAGNOSIS — E039 Hypothyroidism, unspecified: Secondary | ICD-10-CM

## 2022-02-15 LAB — CBC WITH DIFFERENTIAL/PLATELET
Basophils Absolute: 0 10*3/uL (ref 0.0–0.2)
Basos: 1 %
EOS (ABSOLUTE): 0.3 10*3/uL (ref 0.0–0.4)
Eos: 3 %
Hematocrit: 47.6 % — ABNORMAL HIGH (ref 34.0–46.6)
Hemoglobin: 15.7 g/dL (ref 11.1–15.9)
Immature Grans (Abs): 0 10*3/uL (ref 0.0–0.1)
Immature Granulocytes: 0 %
Lymphocytes Absolute: 2.4 10*3/uL (ref 0.7–3.1)
Lymphs: 29 %
MCH: 30.7 pg (ref 26.6–33.0)
MCHC: 33 g/dL (ref 31.5–35.7)
MCV: 93 fL (ref 79–97)
Monocytes Absolute: 0.5 10*3/uL (ref 0.1–0.9)
Monocytes: 7 %
Neutrophils Absolute: 5 10*3/uL (ref 1.4–7.0)
Neutrophils: 60 %
Platelets: 298 10*3/uL (ref 150–450)
RBC: 5.11 x10E6/uL (ref 3.77–5.28)
RDW: 13.9 % (ref 11.7–15.4)
WBC: 8.3 10*3/uL (ref 3.4–10.8)

## 2022-02-15 LAB — LIPID PANEL
Chol/HDL Ratio: 3.7 ratio (ref 0.0–4.4)
Cholesterol, Total: 157 mg/dL (ref 100–199)
HDL: 43 mg/dL (ref >39)
LDL Chol Calc (NIH): 74 mg/dL (ref 0–99)
Triglycerides: 248 mg/dL — ABNORMAL HIGH (ref 0–149)
VLDL Cholesterol Cal: 40 mg/dL (ref 5–40)

## 2022-02-15 LAB — CMP14+EGFR
ALT: 29 IU/L (ref 0–32)
AST: 22 IU/L (ref 0–40)
Albumin/Globulin Ratio: 1.7 (ref 1.2–2.2)
Albumin: 4.1 g/dL (ref 3.7–4.7)
Alkaline Phosphatase: 103 IU/L (ref 44–121)
BUN/Creatinine Ratio: 20 (ref 12–28)
BUN: 18 mg/dL (ref 8–27)
Bilirubin Total: 0.2 mg/dL (ref 0.0–1.2)
CO2: 27 mmol/L (ref 20–29)
Calcium: 10.6 mg/dL — ABNORMAL HIGH (ref 8.7–10.3)
Chloride: 99 mmol/L (ref 96–106)
Creatinine, Ser: 0.9 mg/dL (ref 0.57–1.00)
Globulin, Total: 2.4 g/dL (ref 1.5–4.5)
Glucose: 133 mg/dL — ABNORMAL HIGH (ref 70–99)
Potassium: 4.6 mmol/L (ref 3.5–5.2)
Sodium: 142 mmol/L (ref 134–144)
Total Protein: 6.5 g/dL (ref 6.0–8.5)
eGFR: 63 mL/min/{1.73_m2} (ref >59)

## 2022-02-15 LAB — TSH+FREE T4
Free T4: 1.19 ng/dL (ref 0.82–1.77)
TSH: 5.56 u[IU]/mL — ABNORMAL HIGH (ref 0.450–4.500)

## 2022-02-15 MED ORDER — LEVOTHYROXINE SODIUM 137 MCG PO TABS: 137.0000 ug | ORAL_TABLET | Freq: Every day | ORAL | 3 refills | Status: DC

## 2022-03-18 ENCOUNTER — Other Ambulatory Visit: Payer: Self-pay

## 2022-03-18 ENCOUNTER — Other Ambulatory Visit: Payer: Self-pay | Admitting: Family Medicine

## 2022-03-18 NOTE — Progress Notes (Signed)
Erroneous encounter

## 2022-03-18 NOTE — Telephone Encounter (Signed)
Last office visit 02/14/22 Medication is on current med list but does not show as prescribed by our office.

## 2022-04-14 ENCOUNTER — Telehealth: Payer: Self-pay | Admitting: Family Medicine

## 2022-04-14 NOTE — Telephone Encounter (Signed)
Facility aware.

## 2022-04-15 ENCOUNTER — Other Ambulatory Visit: Payer: Medicare HMO

## 2022-04-15 ENCOUNTER — Other Ambulatory Visit: Payer: Self-pay | Admitting: Family Medicine

## 2022-04-15 DIAGNOSIS — E039 Hypothyroidism, unspecified: Secondary | ICD-10-CM

## 2022-04-16 LAB — TSH+FREE T4
Free T4: 1.36 ng/dL (ref 0.82–1.77)
TSH: 2.67 u[IU]/mL (ref 0.450–4.500)

## 2022-04-19 ENCOUNTER — Ambulatory Visit (INDEPENDENT_AMBULATORY_CARE_PROVIDER_SITE_OTHER): Payer: Medicare HMO | Admitting: Family Medicine

## 2022-04-19 ENCOUNTER — Encounter: Payer: Self-pay | Admitting: Family Medicine

## 2022-04-19 VITALS — BP 141/92 | HR 77 | Temp 98.6°F | Ht 67.0 in | Wt 206.0 lb

## 2022-04-19 DIAGNOSIS — E039 Hypothyroidism, unspecified: Secondary | ICD-10-CM

## 2022-04-19 NOTE — Progress Notes (Signed)
Subjective:  Patient ID: Erin Ewing, female    DOB: 06-Jun-1936  Age: 86 y.o. MRN: [id number]  CC: Medical Management of Chronic Issues   HPI Erin Ewing presents for  follow-up on  thyroid. The patient has a history of hypothyroidism for many years. It has been adjusted recently. Pt. denies any change in  voice, loss of hair, heat or cold intolerance. Energy level has been adequate to good. Patient denies constipation and diarrhea. No myxedema. Medication is as noted below. Verified that pt is taking it daily on an empty stomach. Well tolerated.      04/19/2022   12:00 PM 02/14/2022   12:56 PM 06/23/2021   10:43 AM  Depression screen PHQ 2/9  Decreased Interest 0 0 0  Down, Depressed, Hopeless 0 0 0  PHQ - 2 Score 0 0 0  Altered sleeping 0  1  Tired, decreased energy 0  2  Change in appetite 0  0  Feeling bad or failure about yourself  0  0  Trouble concentrating 0  0  Moving slowly or fidgety/restless 0  0  Suicidal thoughts 0  0  PHQ-9 Score 0  3  Difficult doing work/chores Not difficult at all  Not difficult at all    History Erin Ewing has a past medical history of Arthritis, Hypertension, Obesity, Polycythemia, Reflex sympathetic dystrophy (08/16/2012), and Subclinical hypothyroidism.   Erin Ewing has a past surgical history that includes ABDOMINAL AORTOGRAM W/LOWER EXTREMITY (N/A, 05/30/2017); PERIPHERAL VASCULAR INTERVENTION (Right, 05/30/2017); and PERIPHERAL VASCULAR ATHERECTOMY (Left, 05/30/2017).   Her family history includes Heart disease in her father.Erin Ewing reports that Erin Ewing has been smoking cigarettes. Erin Ewing has never used smokeless tobacco. Erin Ewing reports that Erin Ewing does not drink alcohol and does not use drugs.    ROS Review of Systems  Constitutional: Negative.   HENT: Negative.    Eyes:  Negative for visual disturbance.  Respiratory:  Negative for shortness of breath.   Cardiovascular:  Negative for chest pain.  Gastrointestinal:  Negative for abdominal pain.   Musculoskeletal:  Negative for arthralgias.    Objective:  BP (!) 141/92   Pulse 77   Temp 98.6 F (37 C)   Ht 5\' 7"  (1.702 m)   Wt 206 lb (93.4 kg)   SpO2 94%   BMI 32.26 kg/m   BP Readings from Last 3 Encounters:  04/19/22 (!) 141/92  02/14/22 134/87  10/13/21 (!) 152/82    Wt Readings from Last 3 Encounters:  04/19/22 206 lb (93.4 kg)  10/13/21 201 lb (91.2 kg)  06/23/21 201 lb 3.2 oz (91.3 kg)     Physical Exam Constitutional:      General: Erin Ewing is not in acute distress.    Appearance: Erin Ewing is well-developed.  Cardiovascular:     Rate and Rhythm: Normal rate and regular rhythm.  Pulmonary:     Breath sounds: Normal breath sounds.  Musculoskeletal:        General: Normal range of motion.  Skin:    General: Skin is warm and dry.  Neurological:     Mental Status: Erin Ewing is alert and oriented to person, place, and time.       Assessment & Plan:   Erin Ewing was seen today for medical management of chronic issues.  Diagnoses and all orders for this visit:  Hypothyroidism, unspecified type       I am having Erin Ewing maintain her (Diphenhydramine-APAP, sleep, (TYLENOL PM EXTRA STRENGTH PO)), Aspirin Low Dose, gabapentin, Calcium 600/Vitamin  D3, trimethoprim-polymyxin b, diclofenac Sodium, DULoxetine, olmesartan-hydrochlorothiazide, levothyroxine, furosemide, and furosemide.  Allergies as of 04/19/2022       Reactions   Fish Allergy Anaphylaxis   Peanuts [peanut Oil] Anaphylaxis   Celebrex [celecoxib] Diarrhea   Diclofenac Diarrhea        Medication List        Accurate as of April 19, 2022 11:59 PM. If you have any questions, ask your nurse or doctor.          Aspirin Low Dose 81 MG chewable tablet Generic drug: aspirin TAKE (1) TABLET BY MOUTH ONCE DAILY.   Calcium 600/Vitamin D3 600-20 MG-MCG Tabs Generic drug: Calcium Carb-Cholecalciferol TAKE (1) TABLET BY MOUTH TWICE DAILY.   diclofenac Sodium 1 % Gel Commonly known  as: VOLTAREN APPLY (2 GRAMS) TOPICALLY FOUR TIMES DAILY.   DULoxetine 60 MG capsule Commonly known as: Cymbalta Take 1 capsule (60 mg total) by mouth daily.   furosemide 20 MG tablet Commonly known as: LASIX Take 20 mg by mouth daily.   furosemide 20 MG tablet Commonly known as: LASIX TAKE 1 TABLET BY MOUTH ONCE DAILY.   gabapentin 600 MG tablet Commonly known as: NEURONTIN Take 1 tablet (600 mg total) by mouth 2 (two) times daily.   levothyroxine 137 MCG tablet Commonly known as: SYNTHROID Take 1 tablet (137 mcg total) by mouth daily.   olmesartan-hydrochlorothiazide 40-12.5 MG tablet Commonly known as: BENICAR HCT Take 1 tablet by mouth daily.   trimethoprim-polymyxin b ophthalmic solution Commonly known as: Polytrim Place 1 drop into the left eye every 6 (six) hours.   TYLENOL PM EXTRA STRENGTH PO Take 1 tablet by mouth at bedtime as needed.         Follow-up: Return in about 6 months (around 10/19/2022).  Claretta Fraise, M.D.

## 2022-04-23 ENCOUNTER — Encounter: Payer: Self-pay | Admitting: Family Medicine

## 2022-05-26 ENCOUNTER — Other Ambulatory Visit: Payer: Self-pay

## 2022-05-26 ENCOUNTER — Encounter (HOSPITAL_COMMUNITY): Payer: Self-pay | Admitting: *Deleted

## 2022-05-26 ENCOUNTER — Emergency Department (HOSPITAL_COMMUNITY)
Admission: EM | Admit: 2022-05-26 | Discharge: 2022-05-26 | Disposition: A | Discharge status: Skilled Nursing Facility | Payer: Medicare HMO | Attending: Emergency Medicine | Admitting: Emergency Medicine

## 2022-05-26 DIAGNOSIS — Z91010 Allergy to peanuts: Secondary | ICD-10-CM | POA: Insufficient documentation

## 2022-05-26 DIAGNOSIS — Z79899 Other long term (current) drug therapy: Secondary | ICD-10-CM | POA: Insufficient documentation

## 2022-05-26 DIAGNOSIS — Z7982 Long term (current) use of aspirin: Secondary | ICD-10-CM | POA: Insufficient documentation

## 2022-05-26 DIAGNOSIS — Z043 Encounter for examination and observation following other accident: Secondary | ICD-10-CM | POA: Diagnosis present

## 2022-05-26 DIAGNOSIS — W06XXXA Fall from bed, initial encounter: Secondary | ICD-10-CM | POA: Insufficient documentation

## 2022-05-26 DIAGNOSIS — E039 Hypothyroidism, unspecified: Secondary | ICD-10-CM | POA: Insufficient documentation

## 2022-05-26 DIAGNOSIS — I10 Essential (primary) hypertension: Secondary | ICD-10-CM | POA: Diagnosis not present

## 2022-05-26 DIAGNOSIS — W19XXXA Unspecified fall, initial encounter: Secondary | ICD-10-CM

## 2022-05-26 NOTE — ED Triage Notes (Signed)
Pt BIB [facility] from [facility] from a fall.  Reported slide out of bed onto the floor in sitting position , pt voiced she did not want to come to ED.  Denies any pain

## 2022-05-26 NOTE — ED Notes (Signed)
Pt able to transfer from bed to wheelchair and back w/o assistance. She denies any acute pain when transferring.

## 2022-05-26 NOTE — ED Provider Notes (Signed)
[facility] EMERGENCY DEPARTMENT Provider Note   CSN: [id number] Arrival date & time: 05/26/22  1654     History  Chief Complaint  Patient presents with   Erin Ewing    MALONE Erin Ewing is a 86 y.o. female.  Pt is a 86 yo female with a pmhx significant for hypothyroidism, htn, and RSD.  Pt is wheelchair bound, but can stand to transfer.  Pt slid out of bed onto the floor.  She did not hurt herself and did not want to be seen.  She was brought here by EMS because the facility wanted her to be checked out.  Pt is not on blood thinners.  She did not hit her head.  She denies any new pain.       Home Medications Prior to Admission medications   Medication Sig Start Date End Date Taking? Authorizing Provider  ASPIRIN LOW DOSE 81 MG chewable tablet TAKE (1) TABLET BY MOUTH ONCE DAILY. 12/21/20   Mechele Claude, MD  Calcium Carb-Cholecalciferol (CALCIUM 600/VITAMIN D3) 600-800 MG-UNIT TABS TAKE (1) TABLET BY MOUTH TWICE DAILY. 04/08/21   Mechele Claude, MD  diclofenac Sodium (VOLTAREN) 1 % GEL APPLY (2 GRAMS) TOPICALLY FOUR TIMES DAILY. 12/19/21   Mechele Claude, MD  Diphenhydramine-APAP, sleep, (TYLENOL PM EXTRA STRENGTH PO) Take 1 tablet by mouth at bedtime as needed.    [provider]  DULoxetine (CYMBALTA) 60 MG capsule Take 1 capsule (60 mg total) by mouth daily. 02/14/22   Mechele Claude, MD  furosemide (LASIX) 20 MG tablet TAKE 1 TABLET BY MOUTH ONCE DAILY. 03/20/22   Mechele Claude, MD  furosemide (LASIX) 20 MG tablet Take 20 mg by mouth daily. 02/14/22   [provider]  gabapentin (NEURONTIN) 600 MG tablet Take 1 tablet (600 mg total) by mouth 2 (two) times daily. 02/02/21   Mechele Claude, MD  levothyroxine (SYNTHROID) 137 MCG tablet Take 1 tablet (137 mcg total) by mouth daily. 02/15/22   Mechele Claude, MD  olmesartan-hydrochlorothiazide (BENICAR HCT) 40-12.5 MG tablet Take 1 tablet by mouth daily. 02/14/22   Mechele Claude, MD  trimethoprim-polymyxin b (POLYTRIM)  ophthalmic solution Place 1 drop into the left eye every 6 (six) hours. 11/18/21   Junie Spencer, FNP      Allergies    Fish allergy, Peanuts [peanut oil], Celebrex [celecoxib], and Diclofenac    Review of Systems   Review of Systems  All other systems reviewed and are negative.   Physical Exam Updated Vital Signs BP (!) 143/80   Pulse 91   Temp 99 F (37.2 C) (Oral)   Resp 20   SpO2 94%  Physical Exam Vitals and nursing note reviewed.  Constitutional:      Appearance: Normal appearance.  HENT:     Head: Normocephalic and atraumatic.     Right Ear: External ear normal.     Left Ear: External ear normal.     Nose: Nose normal.     Mouth/Throat:     Mouth: Mucous membranes are moist.     Pharynx: Oropharynx is clear.  Eyes:     Extraocular Movements: Extraocular movements intact.     Conjunctiva/sclera: Conjunctivae normal.     Pupils: Pupils are equal, round, and reactive to light.  Cardiovascular:     Rate and Rhythm: Normal rate and regular rhythm.     Pulses: Normal pulses.     Heart sounds: Normal heart sounds.  Pulmonary:     Effort: Pulmonary effort is normal.  Breath sounds: Normal breath sounds.  Abdominal:     General: Abdomen is flat. Bowel sounds are normal.     Palpations: Abdomen is soft.  Musculoskeletal:        General: Normal range of motion.     Cervical back: Normal range of motion and neck supple.  Skin:    General: Skin is warm.     Capillary Refill: Capillary refill takes less than 2 seconds.  Neurological:     General: No focal deficit present.     Mental Status: She is alert and oriented to person, place, and time.  Psychiatric:        Mood and Affect: Mood normal.        Behavior: Behavior normal.     ED Results / Procedures / Treatments   Labs (all labs ordered are listed, but only abnormal results are displayed) Labs Reviewed - No data to display  EKG None  Radiology No results found.  Procedures Procedures     Medications Ordered in ED Medications - No data to display  ED Course/ Medical Decision Making/ A&P                           Medical Decision Making  This patient presents to the ED for concern of fall, this involves an extensive number of treatment options, and is a complaint that carries with it a high risk of complications and morbidity.  The differential diagnosis includes multiple trauma   Co morbidities that complicate the patient evaluation  hypothyroidism, htn, and RSD   Additional history obtained:  Additional history obtained from epic chart review External records from outside source obtained and reviewed including EMS report Medicines ordered and prescription drug management:   I have reviewed the patients home medicines and have made adjustments as needed   Test Considered:  Xrays/ct scans   Problem List / ED Course:  Fall:  pt denies any pain.  She does not want any x-rays.  She was able to stand without any pain.  She is awake and alert.  As she has no injury and does not want xrays and is alert, so I don't think we need to do any imaging.   Reevaluation:  After the interventions noted above, I reevaluated the patient and found that they have :improved   Social Determinants of Health:  Lives at snf   Dispostion:  After consideration of the diagnostic results and the patients response to treatment, I feel that the patent would benefit from discharge with outpatient f/u.          Final Clinical Impression(s) / ED Diagnoses Final diagnoses:  Fall, initial encounter    Rx / DC Orders ED Discharge Orders     None         Jacalyn Lefevre, MD 05/26/22 [phone]

## 2022-05-27 ENCOUNTER — Telehealth: Payer: Self-pay | Admitting: Family Medicine

## 2022-05-27 NOTE — Telephone Encounter (Signed)
[facility] called to let Dr Darlyn Read know that pt had a fall yesterday and was treated in the ER. Will call back to make pt an appt if symptoms worsen.

## 2022-08-17 ENCOUNTER — Encounter: Payer: Self-pay | Admitting: Family Medicine

## 2022-08-17 ENCOUNTER — Ambulatory Visit (INDEPENDENT_AMBULATORY_CARE_PROVIDER_SITE_OTHER): Payer: Medicare HMO | Admitting: Family Medicine

## 2022-08-17 VITALS — BP 162/84 | HR 57 | Temp 97.3°F

## 2022-08-17 DIAGNOSIS — E039 Hypothyroidism, unspecified: Secondary | ICD-10-CM | POA: Diagnosis not present

## 2022-08-17 DIAGNOSIS — F4321 Adjustment disorder with depressed mood: Secondary | ICD-10-CM

## 2022-08-17 DIAGNOSIS — I10 Essential (primary) hypertension: Secondary | ICD-10-CM

## 2022-08-17 DIAGNOSIS — E785 Hyperlipidemia, unspecified: Secondary | ICD-10-CM

## 2022-08-17 DIAGNOSIS — F4320 Adjustment disorder, unspecified: Secondary | ICD-10-CM

## 2022-08-17 NOTE — Progress Notes (Signed)
Subjective:  Patient ID: Erin Ewing, female    DOB: 01-10-1936  Age: 87 y.o. MRN: [id number]  CC: Medical Management of Chronic Issues   HPI Erin Ewing presents for  follow-up of hypertension. Patient has no history of headache chest pain or shortness of breath or recent cough. Patient also denies symptoms of TIA such as focal numbness or weakness. Patient denies side effects from medication. States taking it regularly.   follow-up on  thyroid. The patient has a history of hypothyroidism for many years. It has been stable recently. Pt. denies any change in  voice, loss of hair, heat or cold intolerance. Energy level has been adequate to good. Patient denies constipation and diarrhea. No myxedema. Medication is as noted below. Verified that pt is taking it daily on an empty stomach. Well tolerated.  Grieving the loss of beloved nephew. Died in his sleep on 07/27/23.  History Erin Ewing has a past medical history of Arthritis, Hypertension, Obesity, Polycythemia, Reflex sympathetic dystrophy (08/16/2012), and Subclinical hypothyroidism.   She has a past surgical history that includes ABDOMINAL AORTOGRAM W/LOWER EXTREMITY (N/A, 05/30/2017); PERIPHERAL VASCULAR INTERVENTION (Right, 05/30/2017); and PERIPHERAL VASCULAR ATHERECTOMY (Left, 05/30/2017).   Her family history includes Heart disease in her father.She reports that she has been smoking cigarettes. She has never used smokeless tobacco. She reports that she does not drink alcohol and does not use drugs.  Current Outpatient Medications on File Prior to Visit  Medication Sig Dispense Refill   ASPIRIN LOW DOSE 81 MG chewable tablet TAKE (1) TABLET BY MOUTH ONCE DAILY. 30 tablet 5   Calcium Carb-Cholecalciferol (CALCIUM 600/VITAMIN D3) 600-800 MG-UNIT TABS TAKE (1) TABLET BY MOUTH TWICE DAILY. 60 tablet 5   diclofenac Sodium (VOLTAREN) 1 % GEL APPLY (2 GRAMS) TOPICALLY FOUR TIMES DAILY. 100 g 0   Diphenhydramine-APAP, sleep, (TYLENOL  PM EXTRA STRENGTH PO) Take 1 tablet by mouth at bedtime as needed.     DULoxetine (CYMBALTA) 60 MG capsule Take 1 capsule (60 mg total) by mouth daily. 90 capsule 3   furosemide (LASIX) 20 MG tablet TAKE 1 TABLET BY MOUTH ONCE DAILY. 30 tablet 0   furosemide (LASIX) 20 MG tablet Take 20 mg by mouth daily.     gabapentin (NEURONTIN) 600 MG tablet Take 1 tablet (600 mg total) by mouth 2 (two) times daily. 60 tablet 5   levothyroxine (SYNTHROID) 137 MCG tablet Take 1 tablet (137 mcg total) by mouth daily. 90 tablet 3   olmesartan-hydrochlorothiazide (BENICAR HCT) 40-12.5 MG tablet Take 1 tablet by mouth daily. 90 tablet 3   trimethoprim-polymyxin b (POLYTRIM) ophthalmic solution Place 1 drop into the left eye every 6 (six) hours. 10 mL 0   No current facility-administered medications on file prior to visit.    ROS Review of Systems  Constitutional: Negative.   HENT: Negative.    Eyes:  Negative for visual disturbance.  Respiratory:  Negative for shortness of breath.   Cardiovascular:  Negative for chest pain.  Gastrointestinal:  Negative for abdominal pain.  Musculoskeletal:  Negative for arthralgias.    Objective:  BP (!) 162/84   Pulse (!) 57   Temp (!) 97.3 F (36.3 C)   SpO2 96%   BP Readings from Last 3 Encounters:  08/17/22 (!) 162/84  05/26/22 (!) 158/98  04/19/22 (!) 141/92    Wt Readings from Last 3 Encounters:  04/19/22 206 lb (93.4 kg)  10/13/21 201 lb (91.2 kg)  06/23/21 201 lb 3.2 oz (91.3 kg)  Physical Exam Constitutional:      General: She is not in acute distress.    Appearance: Normal appearance. She is well-developed. She is obese.  Cardiovascular:     Rate and Rhythm: Normal rate and regular rhythm.  Pulmonary:     Breath sounds: Normal breath sounds.  Musculoskeletal:        General: Normal range of motion.  Skin:    General: Skin is warm and dry.  Neurological:     Mental Status: She is alert and oriented to person, place, and time.   Psychiatric:        Attention and Perception: Attention normal.        Mood and Affect: Affect is tearful.        Speech: Speech normal.        Cognition and Memory: Cognition is impaired.       Assessment & Plan:   Erin Ewing was seen today for medical management of chronic issues.  Diagnoses and all orders for this visit:  Hypothyroidism, unspecified type -     TSH + free T4  Hyperlipidemia, unspecified hyperlipidemia type -     Lipid panel  Primary hypertension -     CBC with Differential/Platelet -     CMP14+EGFR  Grief reaction   Allergies as of 08/17/2022       Reactions   Fish Allergy Anaphylaxis   Peanuts [peanut Oil] Anaphylaxis   Celebrex [celecoxib] Diarrhea   Diclofenac Diarrhea        Medication List        Accurate as of August 17, 2022  6:56 PM. If you have any questions, ask your nurse or doctor.          Aspirin Low Dose 81 MG chewable tablet Generic drug: aspirin TAKE (1) TABLET BY MOUTH ONCE DAILY.   Calcium 600/Vitamin D3 600-20 MG-MCG Tabs Generic drug: Calcium Carb-Cholecalciferol TAKE (1) TABLET BY MOUTH TWICE DAILY.   diclofenac Sodium 1 % Gel Commonly known as: VOLTAREN APPLY (2 GRAMS) TOPICALLY FOUR TIMES DAILY.   DULoxetine 60 MG capsule Commonly known as: Cymbalta Take 1 capsule (60 mg total) by mouth daily.   furosemide 20 MG tablet Commonly known as: LASIX Take 20 mg by mouth daily.   furosemide 20 MG tablet Commonly known as: LASIX TAKE 1 TABLET BY MOUTH ONCE DAILY.   gabapentin 600 MG tablet Commonly known as: NEURONTIN Take 1 tablet (600 mg total) by mouth 2 (two) times daily.   levothyroxine 137 MCG tablet Commonly known as: SYNTHROID Take 1 tablet (137 mcg total) by mouth daily.   olmesartan-hydrochlorothiazide 40-12.5 MG tablet Commonly known as: BENICAR HCT Take 1 tablet by mouth daily.   trimethoprim-polymyxin b ophthalmic solution Commonly known as: Polytrim Place 1 drop into the left eye  every 6 (six) hours.   TYLENOL PM EXTRA STRENGTH PO Take 1 tablet by mouth at bedtime as needed.         Follow-up: Return in about 6 months (around 02/15/2023).  Claretta Fraise, M.D.

## 2022-08-18 LAB — CBC WITH DIFFERENTIAL/PLATELET
Basophils Absolute: 0 10*3/uL (ref 0.0–0.2)
Basos: 1 %
EOS (ABSOLUTE): 0.3 10*3/uL (ref 0.0–0.4)
Eos: 3 %
Hematocrit: 48 % — ABNORMAL HIGH (ref 34.0–46.6)
Hemoglobin: 16 g/dL — ABNORMAL HIGH (ref 11.1–15.9)
Immature Grans (Abs): 0 10*3/uL (ref 0.0–0.1)
Immature Granulocytes: 0 %
Lymphocytes Absolute: 2.1 10*3/uL (ref 0.7–3.1)
Lymphs: 26 %
MCH: 30.4 pg (ref 26.6–33.0)
MCHC: 33.3 g/dL (ref 31.5–35.7)
MCV: 91 fL (ref 79–97)
Monocytes Absolute: 0.5 10*3/uL (ref 0.1–0.9)
Monocytes: 7 %
Neutrophils Absolute: 5.2 10*3/uL (ref 1.4–7.0)
Neutrophils: 63 %
Platelets: 291 10*3/uL (ref 150–450)
RBC: 5.26 x10E6/uL (ref 3.77–5.28)
RDW: 13.5 % (ref 11.7–15.4)
WBC: 8.1 10*3/uL (ref 3.4–10.8)

## 2022-08-18 LAB — CMP14+EGFR
ALT: 20 IU/L (ref 0–32)
AST: 16 IU/L (ref 0–40)
Albumin/Globulin Ratio: 1.7 (ref 1.2–2.2)
Albumin: 4 g/dL (ref 3.7–4.7)
Alkaline Phosphatase: 100 IU/L (ref 44–121)
BUN/Creatinine Ratio: 18 (ref 12–28)
BUN: 15 mg/dL (ref 8–27)
Bilirubin Total: 0.3 mg/dL (ref 0.0–1.2)
CO2: 27 mmol/L (ref 20–29)
Calcium: 10.2 mg/dL (ref 8.7–10.3)
Chloride: 99 mmol/L (ref 96–106)
Creatinine, Ser: 0.82 mg/dL (ref 0.57–1.00)
Globulin, Total: 2.4 g/dL (ref 1.5–4.5)
Glucose: 95 mg/dL (ref 70–99)
Potassium: 4.5 mmol/L (ref 3.5–5.2)
Sodium: 140 mmol/L (ref 134–144)
Total Protein: 6.4 g/dL (ref 6.0–8.5)
eGFR: 70 mL/min/{1.73_m2} (ref >59)

## 2022-08-18 LAB — TSH+FREE T4
Free T4: 1.5 ng/dL (ref 0.82–1.77)
TSH: 2.99 u[IU]/mL (ref 0.450–4.500)

## 2022-08-18 LAB — LIPID PANEL
Chol/HDL Ratio: 3.6 ratio (ref 0.0–4.4)
Cholesterol, Total: 161 mg/dL (ref 100–199)
HDL: 45 mg/dL (ref >39)
LDL Chol Calc (NIH): 86 mg/dL (ref 0–99)
Triglycerides: 176 mg/dL — ABNORMAL HIGH (ref 0–149)
VLDL Cholesterol Cal: 30 mg/dL (ref 5–40)

## 2022-08-23 NOTE — Progress Notes (Signed)
Hello Emaleigh, ? ?Your lab result is normal and/or stable.Some minor variations that are not significant are commonly marked abnormal, but do not represent any medical problem for you. ? ?Best regards, ?Kathrynn Backstrom, M.D.

## 2022-09-06 ENCOUNTER — Other Ambulatory Visit: Payer: Self-pay | Admitting: Family Medicine

## 2022-09-06 NOTE — Telephone Encounter (Signed)
Pt is in a facility and hardcopy was faxed to pharmacy. The rx has not refills. Pharmacy is asking that a d/c rx be faxed

## 2022-09-07 ENCOUNTER — Other Ambulatory Visit: Payer: Self-pay | Admitting: Family Medicine

## 2022-09-08 ENCOUNTER — Telehealth: Payer: Self-pay | Admitting: Family Medicine

## 2022-09-08 NOTE — Telephone Encounter (Signed)
Written DC order on providers desk

## 2022-09-08 NOTE — Telephone Encounter (Signed)
  Prescription Request  09/08/2022  Is this a "Controlled Substance" medicine? no  Have you seen your PCP in the last 2 weeks? no  If YES, route message to pool  -  If NO, patient needs to be scheduled for appointment.  What is the name of the medication or equipment?  Docusate Sodium 100 MG TAKE (1) CAPSULE BY MOUTH ONCE DAILY. - Patient is in [place]. RX was denied on 2-28.  Have you contacted your pharmacy to request a refill? yes   Which pharmacy would you like this sent to? [facility]   Patient notified that their request is being sent to the clinical staff for review and that they should receive a response within 2 business days.

## 2022-09-09 ENCOUNTER — Other Ambulatory Visit: Payer: Self-pay | Admitting: Family Medicine

## 2022-09-09 NOTE — Telephone Encounter (Signed)
See note on 09/06/2022 at 12:50pm

## 2022-09-09 NOTE — Telephone Encounter (Signed)
DC order faxed to [facility], called and was confirmed they received it.

## 2023-01-27 ENCOUNTER — Telehealth: Payer: Self-pay | Admitting: Family Medicine

## 2023-01-27 NOTE — Telephone Encounter (Signed)
[facility] asking for a d/c rx for diclofenac Sodium (VOLTAREN) 1 % GEL. States pt has not been using. Please fax to [phone]

## 2023-01-27 NOTE — Telephone Encounter (Signed)
Is it ok to send over D/C order

## 2023-01-29 NOTE — Telephone Encounter (Signed)
Yes, please.

## 2023-01-30 ENCOUNTER — Encounter: Payer: Self-pay | Admitting: *Deleted

## 2023-01-30 NOTE — Telephone Encounter (Signed)
ORDER FAXED AS REQUESTED 

## 2023-02-03 ENCOUNTER — Telehealth: Payer: Self-pay | Admitting: Family Medicine

## 2023-02-03 NOTE — Telephone Encounter (Signed)
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

## 2023-02-03 NOTE — Telephone Encounter (Signed)
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

## 2023-02-15 ENCOUNTER — Encounter: Payer: Self-pay | Admitting: Family Medicine

## 2023-02-15 ENCOUNTER — Ambulatory Visit (INDEPENDENT_AMBULATORY_CARE_PROVIDER_SITE_OTHER): Payer: Medicare HMO | Admitting: Family Medicine

## 2023-02-15 VITALS — BP 171/90 | HR 57 | Temp 97.3°F | Ht 67.0 in

## 2023-02-15 DIAGNOSIS — E039 Hypothyroidism, unspecified: Secondary | ICD-10-CM

## 2023-02-15 DIAGNOSIS — E785 Hyperlipidemia, unspecified: Secondary | ICD-10-CM | POA: Diagnosis not present

## 2023-02-15 DIAGNOSIS — I10 Essential (primary) hypertension: Secondary | ICD-10-CM

## 2023-02-15 MED ORDER — NEOMYCIN-POLYMYXIN-HC 3.5-10000-1 OP SUSP: 1.0000 [drp] | Freq: Four times a day (QID) | OPHTHALMIC | 0 refills | Status: AC

## 2023-02-15 NOTE — Progress Notes (Signed)
Subjective:  Patient ID: Erin Ewing, female    DOB: 27-Jun-1936  Age: 87 y.o. MRN: [id number]  CC: Medical Management of Chronic Issues   HPI Erin Ewing presents for  follow-up on  thyroid. The patient has a history of hypothyroidism for many years. It has been stable recently. Pt. denies any change in  voice, loss of hair, heat or cold intolerance. Energy level has been adequate to good. Patient denies constipation and diarrhea. No myxedema. Medication is as noted below. Verified that pt is taking it daily on an empty stomach. Well tolerated.   presents for  follow-up of hypertension. Patient has no history of headache chest pain or shortness of breath or recent cough. Patient also denies symptoms of TIA such as focal numbness or weakness. Patient denies side effects from medication. States taking it regularly.   follow-up on  thyroid. The patient has a history of hypothyroidism for many years. It has been stable recently. Pt. denies any change in  voice, loss of hair, heat or cold intolerance. Energy level has been adequate to good. Patient denies constipation and diarrhea. No myxedema. Medication is as noted below. Verified that pt is taking it daily on an empty stomach. Well tolerated.   Has poor memory. Now in long term care facility as a result.       02/15/2023   11:14 AM 08/17/2022   11:21 AM 04/19/2022   12:00 PM  Depression screen PHQ 2/9  Decreased Interest 0 0 0  Down, Depressed, Hopeless 0 0 0  PHQ - 2 Score 0 0 0  Altered sleeping   0  Tired, decreased energy   0  Change in appetite   0  Feeling bad or failure about yourself    0  Trouble concentrating   0  Moving slowly or fidgety/restless   0  Suicidal thoughts   0  PHQ-9 Score   0  Difficult doing work/chores   Not difficult at all    History Erin Ewing has a past medical history of Arthritis, Hypertension, Obesity, Polycythemia, Reflex sympathetic dystrophy (08/16/2012), and Subclinical hypothyroidism.    She has a past surgical history that includes ABDOMINAL AORTOGRAM W/LOWER EXTREMITY (N/A, 05/30/2017); PERIPHERAL VASCULAR INTERVENTION (Right, 05/30/2017); and PERIPHERAL VASCULAR ATHERECTOMY (Left, 05/30/2017).   Her family history includes Heart disease in her father.She reports that she has been smoking cigarettes. She has never used smokeless tobacco. She reports that she does not drink alcohol and does not use drugs.    ROS Review of Systems  Constitutional: Negative.   HENT: Negative.    Eyes:  Negative for visual disturbance.  Respiratory:  Negative for shortness of breath.   Cardiovascular:  Negative for chest pain.  Gastrointestinal:  Negative for abdominal pain.  Musculoskeletal:  Negative for arthralgias.    Objective:  BP (!) 171/90   Pulse (!) 57   Temp (!) 97.3 F (36.3 C)   Ht 5\' 7"  (1.702 m)   SpO2 98%   BMI 32.26 kg/m   BP Readings from Last 3 Encounters:  02/15/23 (!) 171/90  08/17/22 (!) 162/84  05/26/22 (!) 158/98    Wt Readings from Last 3 Encounters:  04/19/22 206 lb (93.4 kg)  10/13/21 201 lb (91.2 kg)  06/23/21 201 lb 3.2 oz (91.3 kg)     Physical Exam Constitutional:      General: She is not in acute distress.    Appearance: She is well-developed.  Cardiovascular:     Rate and  Rhythm: Normal rate and regular rhythm.  Pulmonary:     Breath sounds: Normal breath sounds.  Musculoskeletal:        General: Normal range of motion.  Skin:    General: Skin is warm and dry.  Neurological:     Mental Status: She is alert and oriented to person, place, and time.       Assessment & Plan:   Erin Ewing was seen today for medical management of chronic issues.  Diagnoses and all orders for this visit:  Hypothyroidism, unspecified type -     TSH + free T4  Hyperlipidemia, unspecified hyperlipidemia type -     Lipid panel  Primary hypertension -     CBC with Differential/Platelet -     CMP14+EGFR  Other orders -      neomycin-polymyxin-hydrocortisone (CORTISPORIN) 3.5-10000-1 ophthalmic suspension; Place 1 drop into both eyes 4 (four) times daily for 10 days.       I have discontinued Harvie Bridge. [name]'s trimethoprim-polymyxin b. I am also having her start on neomycin-polymyxin-hydrocortisone. Additionally, I am having her maintain her (Diphenhydramine-APAP, sleep, (TYLENOL PM EXTRA STRENGTH PO)), Aspirin Low Dose, gabapentin, Calcium 600/Vitamin D3, diclofenac Sodium, DULoxetine, olmesartan-hydrochlorothiazide, levothyroxine, furosemide, and furosemide.  Allergies as of 02/15/2023       Reactions   Fish Allergy Anaphylaxis   Peanuts [peanut Oil] Anaphylaxis   Celebrex [celecoxib] Diarrhea   Diclofenac Diarrhea        Medication List        Accurate as of February 15, 2023  5:03 PM. If you have any questions, ask your nurse or doctor.          STOP taking these medications    trimethoprim-polymyxin b ophthalmic solution Commonly known as: Polytrim Stopped by: Dionicia Cerritos       TAKE these medications    Aspirin Low Dose 81 MG chewable tablet Generic drug: aspirin TAKE (1) TABLET BY MOUTH ONCE DAILY.   Calcium 600/Vitamin D3 600-800 MG-UNIT Tabs Generic drug: Calcium Carb-Cholecalciferol TAKE (1) TABLET BY MOUTH TWICE DAILY.   diclofenac Sodium 1 % Gel Commonly known as: VOLTAREN APPLY (2 GRAMS) TOPICALLY FOUR TIMES DAILY.   DULoxetine 60 MG capsule Commonly known as: Cymbalta Take 1 capsule (60 mg total) by mouth daily.   furosemide 20 MG tablet Commonly known as: LASIX Take 20 mg by mouth daily.   furosemide 20 MG tablet Commonly known as: LASIX TAKE 1 TABLET BY MOUTH ONCE DAILY.   gabapentin 600 MG tablet Commonly known as: NEURONTIN Take 1 tablet (600 mg total) by mouth 2 (two) times daily.   levothyroxine 137 MCG tablet Commonly known as: SYNTHROID Take 1 tablet (137 mcg total) by mouth daily.   neomycin-polymyxin-hydrocortisone 3.5-10000-1 ophthalmic  suspension Commonly known as: CORTISPORIN Place 1 drop into both eyes 4 (four) times daily for 10 days. Started by: Eamon Tantillo   olmesartan-hydrochlorothiazide 40-12.5 MG tablet Commonly known as: BENICAR HCT Take 1 tablet by mouth daily.   TYLENOL PM EXTRA STRENGTH PO Take 1 tablet by mouth at bedtime as needed.         Follow-up: Return in about 6 months (around 08/18/2023).  Mechele Claude, M.D.

## 2023-02-16 NOTE — Progress Notes (Signed)
Hello Erin Ewing, ? ?Your lab result is normal and/or stable.Some minor variations that are not significant are commonly marked abnormal, but do not represent any medical problem for you. ? ?Best regards, ?Jackalyn Haith, M.D.

## 2023-03-17 ENCOUNTER — Telehealth: Payer: Self-pay | Admitting: Family Medicine

## 2023-03-17 NOTE — Telephone Encounter (Signed)
Spoke with [facility] facility about when they would be able to bring pt in to see Dr Darlyn Read for evaluation. Please open appt with [facility] on 9/18 at 2:25 and schedule pt for this appt. I sent out a request to nurse chat but no nurse was available at the time.   Erin Ewing is aware of date and time of appointment.

## 2023-03-17 NOTE — Telephone Encounter (Signed)
Patient has been scheduled in requested appt spot

## 2023-03-17 NOTE — Telephone Encounter (Signed)
  Incoming Patient Call  03/17/2023  What symptoms do you have? BLURRY Vision   How long have you been sick? A few weeks  Have you been seen for this problem? Yes was given eye drops, although her eyes are clear pt says her vision seems blurry  If your provider decides to give you a prescription, which pharmacy would you like for it to be sent to? [facility]  Patient informed that this information will be sent to the clinical staff for review and that they should receive a follow up call.

## 2023-03-29 ENCOUNTER — Ambulatory Visit (INDEPENDENT_AMBULATORY_CARE_PROVIDER_SITE_OTHER): Payer: Medicare HMO | Admitting: Family Medicine

## 2023-03-29 ENCOUNTER — Encounter: Payer: Self-pay | Admitting: Family Medicine

## 2023-03-29 VITALS — BP 130/65 | HR 57 | Temp 97.4°F | Ht 67.0 in | Wt 206.0 lb

## 2023-03-29 DIAGNOSIS — H538 Other visual disturbances: Secondary | ICD-10-CM

## 2023-03-29 DIAGNOSIS — M170 Bilateral primary osteoarthritis of knee: Secondary | ICD-10-CM

## 2023-03-29 DIAGNOSIS — Z23 Encounter for immunization: Secondary | ICD-10-CM

## 2023-03-29 MED ORDER — DICLOFENAC SODIUM 1 % EX GEL: 4.0000 g | Freq: Four times a day (QID) | CUTANEOUS | 11 refills | Status: AC

## 2023-03-29 NOTE — Progress Notes (Addendum)
Subjective:  Patient ID: Erin Ewing, female    DOB: December 12, 1935  Age: 87 y.o. MRN: [id number]  CC: Blurred Vision   HPI Erin Ewing presents for blurred vision. Left worse than right. Painful. Reads a lot. Now can't read. Onset was about a month ago. Until then vision was clear ever since finishing the eye drops. Then she noted rather rapid onset of blurring. She can scan books now but not read them fully.    Knee pain, bilaterally. Anterior joint lines. Ache, throb, stiff. Requesting pain relief treatment.      02/15/2023   11:14 AM 08/17/2022   11:21 AM 04/19/2022   12:00 PM  Depression screen PHQ 2/9  Decreased Interest 0 0 0  Down, Depressed, Hopeless 0 0 0  PHQ - 2 Score 0 0 0  Altered sleeping   0  Tired, decreased energy   0  Change in appetite   0  Feeling bad or failure about yourself    0  Trouble concentrating   0  Moving slowly or fidgety/restless   0  Suicidal thoughts   0  PHQ-9 Score   0  Difficult doing work/chores   Not difficult at all    History Erin Ewing has a past medical history of Arthritis, Hypertension, Obesity, Polycythemia, Reflex sympathetic dystrophy (08/16/2012), and Subclinical hypothyroidism.   She has a past surgical history that includes ABDOMINAL AORTOGRAM W/LOWER EXTREMITY (N/A, 05/30/2017); PERIPHERAL VASCULAR INTERVENTION (Right, 05/30/2017); and PERIPHERAL VASCULAR ATHERECTOMY (Left, 05/30/2017).   Her family history includes Heart disease in her father.She reports that she has been smoking cigarettes. She has never used smokeless tobacco. She reports that she does not drink alcohol and does not use drugs.    ROS Review of Systems  Constitutional: Negative.   HENT: Negative.    Eyes:  Positive for pain and visual disturbance.  Respiratory:  Negative for shortness of breath.   Cardiovascular:  Negative for chest pain.  Gastrointestinal:  Negative for abdominal pain.  Musculoskeletal:  Negative for arthralgias.     Objective:  BP 130/65   Pulse (!) 57   Temp (!) 97.4 F (36.3 C)   Ht 5\' 7"  (1.702 m)   Wt 206 lb (93.4 kg) Comment: refused  SpO2 98%   BMI 32.26 kg/m   BP Readings from Last 3 Encounters:  03/29/23 130/65  02/15/23 (!) 171/90  08/17/22 (!) 162/84    Wt Readings from Last 3 Encounters:  03/29/23 206 lb (93.4 kg)  04/19/22 206 lb (93.4 kg)  10/13/21 201 lb (91.2 kg)     Physical Exam Constitutional:      General: She is not in acute distress.    Appearance: She is well-developed.  HENT:     Nose: No congestion.     Mouth/Throat:     Mouth: Mucous membranes are moist.     Pharynx: Oropharynx is clear.  Eyes:     General: No scleral icterus.       Right eye: No discharge.        Left eye: No discharge.     Extraocular Movements: Extraocular movements intact.     Conjunctiva/sclera: Conjunctivae normal.     Pupils: Pupils are equal, round, and reactive to light.  Cardiovascular:     Rate and Rhythm: Normal rate and regular rhythm.  Pulmonary:     Breath sounds: Normal breath sounds.  Musculoskeletal:        General: Normal range of motion.  Cervical back: Normal range of motion.  Skin:    General: Skin is warm and dry.  Neurological:     Mental Status: She is alert and oriented to person, place, and time.       Assessment & Plan:   Erin Ewing was seen today for blurred vision.  Diagnoses and all orders for this visit:  Blurred vision, bilateral -     Ambulatory referral to Ophthalmology  Arthritis of both knees -     diclofenac Sodium (VOLTAREN) 1 % GEL; Apply 4 g topically 4 (four) times daily. To each knee  Encounter for immunization -     Flu Vaccine Trivalent High Dose (Fluad)   Encouraged preliminary evaluation with optometrist of choice near her home in [place].     I have changed Erin Bridge. Ewing's diclofenac Sodium. I am also having her maintain her (Diphenhydramine-APAP, sleep, (TYLENOL PM EXTRA STRENGTH PO)), Aspirin Low  Dose, gabapentin, Calcium 600/Vitamin D3, DULoxetine, olmesartan-hydrochlorothiazide, levothyroxine, furosemide, and furosemide.  Allergies as of 03/29/2023       Reactions   Fish Allergy Anaphylaxis   Peanuts [peanut Oil] Anaphylaxis   Celebrex [celecoxib] Diarrhea   Diclofenac Diarrhea        Medication List        Accurate as of March 29, 2023  5:35 PM. If you have any questions, ask your nurse or doctor.          Aspirin Low Dose 81 MG chewable tablet Generic drug: aspirin TAKE (1) TABLET BY MOUTH ONCE DAILY.   Calcium 600/Vitamin D3 600-800 MG-UNIT Tabs Generic drug: Calcium Carb-Cholecalciferol TAKE (1) TABLET BY MOUTH TWICE DAILY.   diclofenac Sodium 1 % Gel Commonly known as: VOLTAREN Apply 4 g topically 4 (four) times daily. To each knee What changed: See the new instructions. Changed by: Finch Costanzo   DULoxetine 60 MG capsule Commonly known as: Cymbalta Take 1 capsule (60 mg total) by mouth daily.   furosemide 20 MG tablet Commonly known as: LASIX Take 20 mg by mouth daily.   furosemide 20 MG tablet Commonly known as: LASIX TAKE 1 TABLET BY MOUTH ONCE DAILY.   gabapentin 600 MG tablet Commonly known as: NEURONTIN Take 1 tablet (600 mg total) by mouth 2 (two) times daily.   levothyroxine 137 MCG tablet Commonly known as: SYNTHROID Take 1 tablet (137 mcg total) by mouth daily.   olmesartan-hydrochlorothiazide 40-12.5 MG tablet Commonly known as: BENICAR HCT Take 1 tablet by mouth daily.   TYLENOL PM EXTRA STRENGTH PO Take 1 tablet by mouth at bedtime as needed.         Follow-up: Return in about 5 months (around 08/29/2023), or if symptoms worsen or fail to improve.  Mechele Claude, M.D.

## 2023-04-06 ENCOUNTER — Other Ambulatory Visit: Payer: Self-pay | Admitting: Family Medicine

## 2023-06-07 ENCOUNTER — Other Ambulatory Visit: Payer: Self-pay | Admitting: Family Medicine

## 2023-07-28 ENCOUNTER — Telehealth: Payer: Self-pay | Admitting: Family Medicine

## 2023-07-28 ENCOUNTER — Ambulatory Visit: Payer: Self-pay | Admitting: Family Medicine

## 2023-07-28 NOTE — Telephone Encounter (Signed)
Called and spoke with [facility] who had called on behalf of this patient.  They advised that they were trying to get a refill prescription for PreserVision AREDS 2 for this patient.  They advised that it was previously prescribed by [place] [facility] but they have been unable to get in touch with that provider.  A nurse at the facility that the patient is located asked the Pharmacy if they could reach out to Dr Darlyn Read and see if he would write a prescription for this medication for the patient.   Reason for Disposition  [1] Pharmacy calling with prescription question AND [2] triager unable to answer question  Answer Assessment - Initial Assessment Questions 1. REASON FOR CALL or QUESTION: "What is your reason for calling today?" or "How can I best help you?" or "What question do you have that I can help answer?"     Called and spoke with [facility].  They advised that they were trying to get a refill prescription for PreserVision AREDS 2 for this patient.  They advised that it was previously prescribed by [place] Frazier but they have been unable to get in touch with that provider.  A nurse at the facility that the patient is located asked the Pharmacy if they could reach out to Dr Darlyn Read and see if he would write a prescription for this medication for the patient.  Protocols used: Information Only Call - No Triage-A-AH, Medication Question Call-A-AH

## 2023-07-28 NOTE — Telephone Encounter (Signed)
Copied from CRM [id number]. Topic: Clinical - Medication Refill >> Jul 28, 2023 11:55 AM Elle L wrote: Most Recent Primary Care Visit:  Provider: Mechele Claude  Department: [facility]  Visit Type: OFFICE VISIT  Date: 03/29/2023  Medication: PreserVision AREDS 2   Has the patient contacted their pharmacy? Yes, the pharmacy called on behalf of the patient.   Is this the correct pharmacy for this prescription?    [facility] [place] [place] - [address] [place] [address] Phone: [phone] Fax: [phone]   Has the prescription been filled recently? Yes  Is the patient out of the medication? Yes  Has the patient been seen for an appointment in the last year OR does the patient have an upcoming appointment? Yes  Can we respond through MyChart? Yes  Agent: Please be advised that Rx refills may take up to 3 business days. We ask that you follow-up with your pharmacy.

## 2023-07-31 ENCOUNTER — Other Ambulatory Visit: Payer: Self-pay | Admitting: Family Medicine

## 2023-07-31 MED ORDER — PRESERVISION AREDS 2 PO CAPS: 1.0000 | ORAL_CAPSULE | Freq: Every day | ORAL | 3 refills | Status: AC

## 2023-07-31 NOTE — Telephone Encounter (Signed)
Please let the patient know that I sent their prescription to their pharmacy. Thanks, WS 

## 2023-08-21 ENCOUNTER — Ambulatory Visit: Payer: Medicare HMO | Admitting: Family Medicine

## 2023-08-21 ENCOUNTER — Encounter: Payer: Self-pay | Admitting: Family Medicine

## 2023-08-21 VITALS — BP 124/72 | HR 70 | Temp 97.1°F | Ht 67.0 in

## 2023-08-21 DIAGNOSIS — I10 Essential (primary) hypertension: Secondary | ICD-10-CM

## 2023-08-21 DIAGNOSIS — E039 Hypothyroidism, unspecified: Secondary | ICD-10-CM | POA: Diagnosis not present

## 2023-08-21 DIAGNOSIS — M170 Bilateral primary osteoarthritis of knee: Secondary | ICD-10-CM | POA: Diagnosis not present

## 2023-08-21 DIAGNOSIS — F331 Major depressive disorder, recurrent, moderate: Secondary | ICD-10-CM

## 2023-08-21 DIAGNOSIS — F02B0 Dementia in other diseases classified elsewhere, moderate, without behavioral disturbance, psychotic disturbance, mood disturbance, and anxiety: Secondary | ICD-10-CM

## 2023-08-21 DIAGNOSIS — G301 Alzheimer's disease with late onset: Secondary | ICD-10-CM

## 2023-08-21 MED ORDER — LEVOTHYROXINE SODIUM 137 MCG PO TABS
137.0000 ug | ORAL_TABLET | Freq: Every day | ORAL | 3 refills | Status: DC
Start: 2023-08-21 — End: 2023-08-25

## 2023-08-21 MED ORDER — GABAPENTIN 600 MG PO TABS: 600.0000 mg | ORAL_TABLET | Freq: Two times a day (BID) | ORAL | 5 refills | Status: DC

## 2023-08-21 MED ORDER — OLMESARTAN MEDOXOMIL-HCTZ 40-12.5 MG PO TABS: 1.0000 | ORAL_TABLET | Freq: Every day | ORAL | 3 refills | Status: AC

## 2023-08-21 MED ORDER — DULOXETINE HCL 60 MG PO CPEP: 60.0000 mg | ORAL_CAPSULE | Freq: Every day | ORAL | 3 refills | Status: AC

## 2023-08-21 MED ORDER — FUROSEMIDE 20 MG PO TABS: 20.0000 mg | ORAL_TABLET | Freq: Every day | ORAL | 0 refills | Status: AC

## 2023-08-21 NOTE — Progress Notes (Signed)
Subjective:  Patient ID: Erin Ewing, female    DOB: May 01, 1936  Age: 88 y.o. MRN: [id number]  CC: Medical Management of Chronic Issues (No other concerns at this time. ), Orders (Would like orders for a wheel chair), and care gaps (Dexa// agrees)   HPI Erin Ewing presents for unable to walk due to severe arthritis knees. She does not have a wheelchair, but is unable to walk. A walker is not sufficient due to upper body weakness.    follow-up on  thyroid. The patient has a history of hypothyroidism for many years. It has been stable recently. Pt. denies any change in  voice, loss of hair, heat or cold intolerance. Energy level has been adequate to good. Patient denies constipation and diarrhea. No myxedema. Medication is as noted below. Verified that pt is taking it daily on an empty stomach. Well tolerated.   presents for  follow-up of hypertension. Patient has no history of headache chest pain or shortness of breath or recent cough. Patient also denies symptoms of TIA such as focal numbness or weakness. Patient denies side effects from medication. States taking it regularly.      02/15/2023   11:14 AM 08/17/2022   11:21 AM 04/19/2022   12:00 PM  Depression screen PHQ 2/9  Decreased Interest 0 0 0  Down, Depressed, Hopeless 0 0 0  PHQ - 2 Score 0 0 0  Altered sleeping   0  Tired, decreased energy   0  Change in appetite   0  Feeling bad or failure about yourself    0  Trouble concentrating   0  Moving slowly or fidgety/restless   0  Suicidal thoughts   0  PHQ-9 Score   0  Difficult doing work/chores   Not difficult at all    History Erin Ewing has a past medical history of Arthritis, Hypertension, Obesity, Polycythemia, Reflex sympathetic dystrophy (08/16/2012), and Subclinical hypothyroidism.   She has a past surgical history that includes ABDOMINAL AORTOGRAM W/LOWER EXTREMITY (N/A, 05/30/2017); PERIPHERAL VASCULAR INTERVENTION (Right, 05/30/2017); and PERIPHERAL VASCULAR  ATHERECTOMY (Left, 05/30/2017).   Her family history includes Heart disease in her father.She reports that she has been smoking cigarettes. She has never used smokeless tobacco. She reports that she does not drink alcohol and does not use drugs.    ROS Review of Systems  Constitutional: Negative.   HENT: Negative.    Eyes:  Negative for visual disturbance.  Respiratory:  Negative for shortness of breath.   Cardiovascular:  Negative for chest pain.  Gastrointestinal:  Negative for abdominal pain.  Musculoskeletal:  Negative for arthralgias.    Objective:  BP 124/72   Pulse 70   Temp (!) 97.1 F (36.2 C)   Ht 5\' 7"  (1.702 m)   SpO2 98%   BMI 32.26 kg/m   BP Readings from Last 3 Encounters:  08/21/23 124/72  03/29/23 130/65  02/15/23 (!) 171/90    Wt Readings from Last 3 Encounters:  03/29/23 206 lb (93.4 kg)  04/19/22 206 lb (93.4 kg)  10/13/21 201 lb (91.2 kg)     Physical Exam Constitutional:      General: She is not in acute distress.    Appearance: She is well-developed.  Cardiovascular:     Rate and Rhythm: Normal rate and regular rhythm.  Pulmonary:     Breath sounds: Normal breath sounds.  Musculoskeletal:        General: Normal range of motion.  Skin:    General:  Skin is warm and dry.  Neurological:     Mental Status: She is alert and oriented to person, place, and time.  Psychiatric:        Mood and Affect: Mood is elated.        Speech: Speech normal.        Cognition and Memory: Cognition is impaired (confabulation observed).       Assessment & Plan:   Erin Ewing was seen today for medical management of chronic issues, orders and care gaps.  Diagnoses and all orders for this visit:  Primary hypertension -     olmesartan-hydrochlorothiazide (BENICAR HCT) 40-12.5 MG tablet; Take 1 tablet by mouth daily. -     CBC with Differential/Platelet -     TSH + free T4  Moderate episode of recurrent major depressive disorder (HCC) -     DULoxetine  (CYMBALTA) 60 MG capsule; Take 1 capsule (60 mg total) by mouth daily.  Hypothyroidism, unspecified type -     Discontinue: levothyroxine (SYNTHROID) 137 MCG tablet; Take 1 tablet (137 mcg total) by mouth daily. -     CBC with Differential/Platelet -     CMP14+EGFR -     TSH + free T4  Arthritis of both knees -     gabapentin (NEURONTIN) 600 MG tablet; Take 1 tablet (600 mg total) by mouth 2 (two) times daily. -     DME Wheelchair manual  Moderate late onset Alzheimer's dementia without behavioral disturbance, psychotic disturbance, mood disturbance, or anxiety (HCC)  Other orders -     furosemide (LASIX) 20 MG tablet; Take 1 tablet (20 mg total) by mouth daily.       I have discontinued Erin Ewing. Erin Ewing's levothyroxine. I have also changed her furosemide. Additionally, I am having her maintain her Aspirin Low Dose, Calcium 600/Vitamin D3, furosemide, diclofenac Sodium, GoodSense Pain Relief PM Ex St, PreserVision AREDS 2, DULoxetine, gabapentin, and olmesartan-hydrochlorothiazide.  Allergies as of 08/21/2023       Reactions   Fish Allergy Anaphylaxis   Peanuts [peanut Oil] Anaphylaxis   Celebrex [celecoxib] Diarrhea   Diclofenac Diarrhea        Medication List        Accurate as of August 21, 2023 11:59 PM. If you have any questions, ask your nurse or doctor.          Aspirin Low Dose 81 MG chewable tablet Generic drug: aspirin TAKE (1) TABLET BY MOUTH ONCE DAILY.   Calcium 600/Vitamin D3 600-800 MG-UNIT Tabs Generic drug: Calcium Carb-Cholecalciferol TAKE (1) TABLET BY MOUTH TWICE DAILY.   diclofenac Sodium 1 % Gel Commonly known as: VOLTAREN Apply 4 g topically 4 (four) times daily. To each knee   DULoxetine 60 MG capsule Commonly known as: Cymbalta Take 1 capsule (60 mg total) by mouth daily.   furosemide 20 MG tablet Commonly known as: LASIX Take 20 mg by mouth daily.   furosemide 20 MG tablet Commonly known as: LASIX Take 1 tablet (20 mg  total) by mouth daily.   gabapentin 600 MG tablet Commonly known as: NEURONTIN Take 1 tablet (600 mg total) by mouth 2 (two) times daily.   GoodSense Pain Relief PM Ex St 25-500 MG Tabs tablet Generic drug: diphenhydramine-acetaminophen TAKE 1 CAPLET BY MOUTH AT BEDTIME AS NEEDED FOR SLEEP.   levothyroxine 137 MCG tablet Commonly known as: SYNTHROID Take 1 tablet (137 mcg total) by mouth daily.   olmesartan-hydrochlorothiazide 40-12.5 MG tablet Commonly known as: BENICAR HCT Take 1 tablet by  mouth daily.   PreserVision AREDS 2 Caps Take 1 capsule by mouth daily.               Durable Medical Equipment  (From admission, onward)           Start     Ordered   08/21/23 0000  DME Wheelchair manual       Comments: Patient suffers from arthritis (M17.0) which impairs their ability to perform daily activities like bathing, dressing, feeding, grooming, and toileting in the home.  A walker will not resolve issue with performing activities of daily living. A wheelchair will allow patient to safely perform daily activities. Patient can safely propel the wheelchair in the home or has a caregiver who can provide assistance. Length of need Lifetime. Accessories: elevating leg rests (ELRs), wheel locks, extensions and anti-tippers.   08/21/23 1344             Follow-up: Return in about 6 months (around 02/18/2024), or if symptoms worsen or fail to improve, for Hypothyroidism, dementia.  Mechele Claude, M.D.

## 2023-08-22 LAB — CMP14+EGFR
ALT: 25 IU/L (ref 0–32)
AST: 18 IU/L (ref 0–40)
Albumin: 3.8 g/dL (ref 3.7–4.7)
Alkaline Phosphatase: 98 IU/L (ref 44–121)
BUN/Creatinine Ratio: 22 (ref 12–28)
BUN: 18 mg/dL (ref 8–27)
Bilirubin Total: 0.3 mg/dL (ref 0.0–1.2)
CO2: 27 mmol/L (ref 20–29)
Calcium: 10.2 mg/dL (ref 8.7–10.3)
Chloride: 99 mmol/L (ref 96–106)
Creatinine, Ser: 0.81 mg/dL (ref 0.57–1.00)
Globulin, Total: 2.2 g/dL (ref 1.5–4.5)
Glucose: 96 mg/dL (ref 70–99)
Potassium: 4.5 mmol/L (ref 3.5–5.2)
Sodium: 141 mmol/L (ref 134–144)
Total Protein: 6 g/dL (ref 6.0–8.5)
eGFR: 71 mL/min/{1.73_m2} (ref >59)

## 2023-08-22 LAB — CBC WITH DIFFERENTIAL/PLATELET
Basophils Absolute: 0 10*3/uL (ref 0.0–0.2)
Basos: 1 %
EOS (ABSOLUTE): 0.2 10*3/uL (ref 0.0–0.4)
Eos: 2 %
Hematocrit: 47.5 % — ABNORMAL HIGH (ref 34.0–46.6)
Hemoglobin: 15.2 g/dL (ref 11.1–15.9)
Immature Grans (Abs): 0 10*3/uL (ref 0.0–0.1)
Immature Granulocytes: 0 %
Lymphocytes Absolute: 2 10*3/uL (ref 0.7–3.1)
Lymphs: 24 %
MCH: 29.8 pg (ref 26.6–33.0)
MCHC: 32 g/dL (ref 31.5–35.7)
MCV: 93 fL (ref 79–97)
Monocytes Absolute: 0.6 10*3/uL (ref 0.1–0.9)
Monocytes: 8 %
Neutrophils Absolute: 5.4 10*3/uL (ref 1.4–7.0)
Neutrophils: 65 %
Platelets: 277 10*3/uL (ref 150–450)
RBC: 5.1 x10E6/uL (ref 3.77–5.28)
RDW: 13.6 % (ref 11.7–15.4)
WBC: 8.3 10*3/uL (ref 3.4–10.8)

## 2023-08-22 LAB — TSH+FREE T4
Free T4: 1.18 ng/dL (ref 0.82–1.77)
TSH: 7.98 u[IU]/mL — ABNORMAL HIGH (ref 0.450–4.500)

## 2023-08-25 ENCOUNTER — Encounter: Payer: Self-pay | Admitting: Family Medicine

## 2023-08-25 ENCOUNTER — Other Ambulatory Visit: Payer: Self-pay | Admitting: Family Medicine

## 2023-08-25 DIAGNOSIS — E039 Hypothyroidism, unspecified: Secondary | ICD-10-CM

## 2023-08-25 MED ORDER — LEVOTHYROXINE SODIUM 150 MCG PO TABS: 150.0000 ug | ORAL_TABLET | Freq: Every day | ORAL | 1 refills | Status: DC

## 2023-11-27 ENCOUNTER — Telehealth: Payer: Self-pay | Admitting: Family Medicine

## 2023-11-27 NOTE — Telephone Encounter (Signed)
 Aware paperwork ready

## 2024-01-30 ENCOUNTER — Telehealth: Payer: Self-pay

## 2024-01-30 NOTE — Telephone Encounter (Signed)
 Copied from CRM [id number]. Topic: Clinical - Lab/Test Results >> Jan 30, 2024 12:16 PM Antwanette L wrote: Reason for CRM: Kyra from [facility] is calling to request lab results from 08/21/23. Please fax them to [phone]. Kyra phone number is [phone]

## 2024-01-30 NOTE — Telephone Encounter (Signed)
 Done

## 2024-02-19 ENCOUNTER — Ambulatory Visit: Payer: Medicare HMO | Admitting: Family Medicine

## 2024-02-25 IMAGING — DX DG HAND COMPLETE 3+V*R*
3 series · 3 of 3 positions shown · non-contrast
Comparison: Radiograph 08/14/2012.

CLINICAL DATA: Right hand pain.

EXAM:
RIGHT HAND - COMPLETE 3+ VIEW

[hand pa]
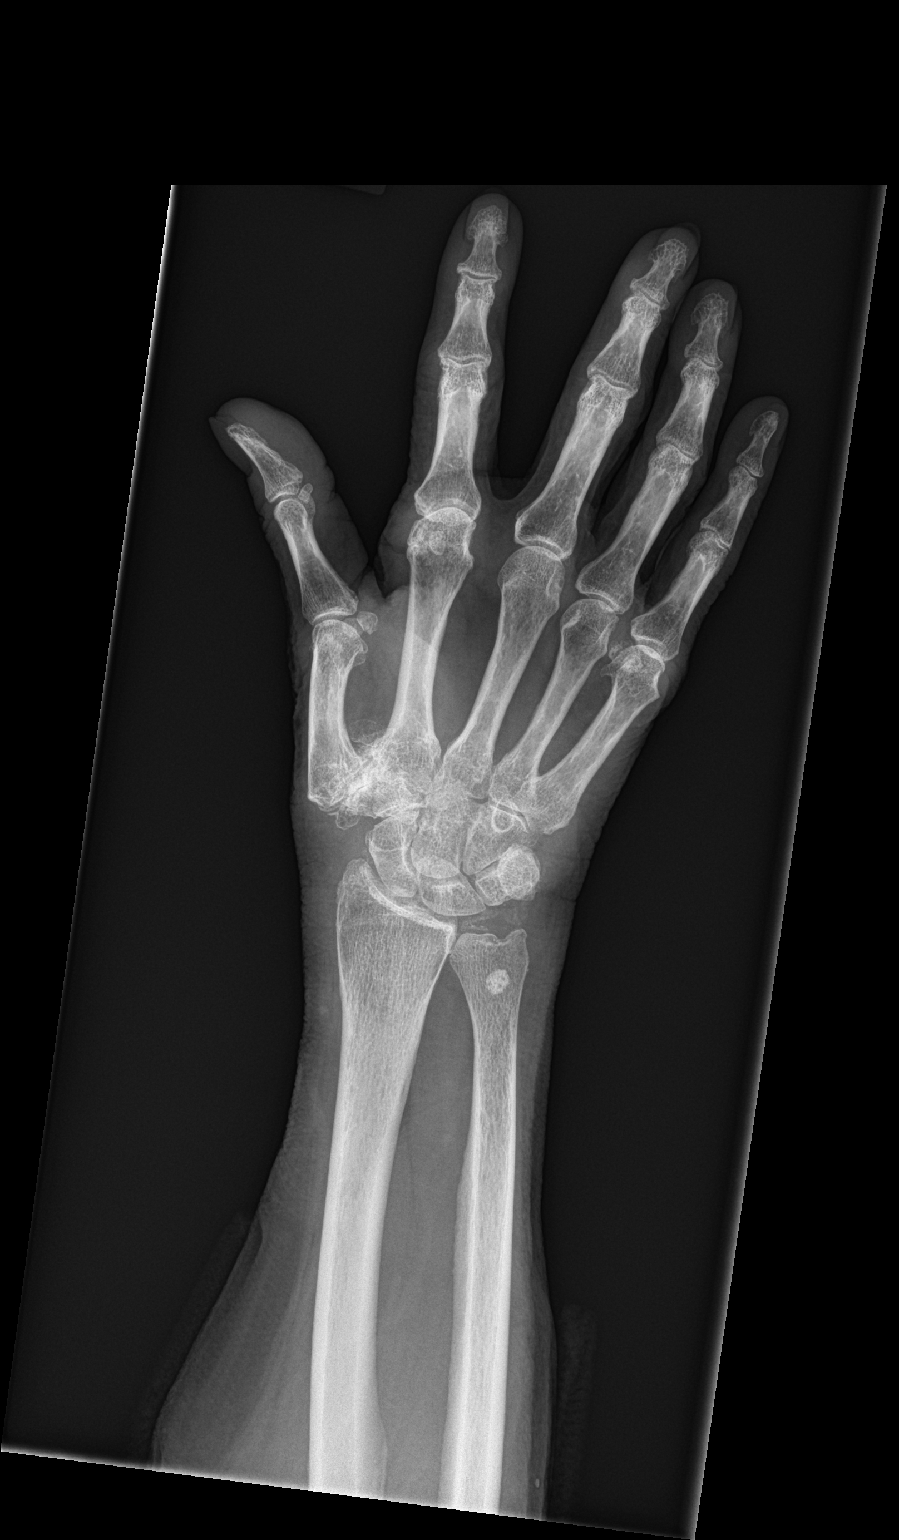

[hand obl]
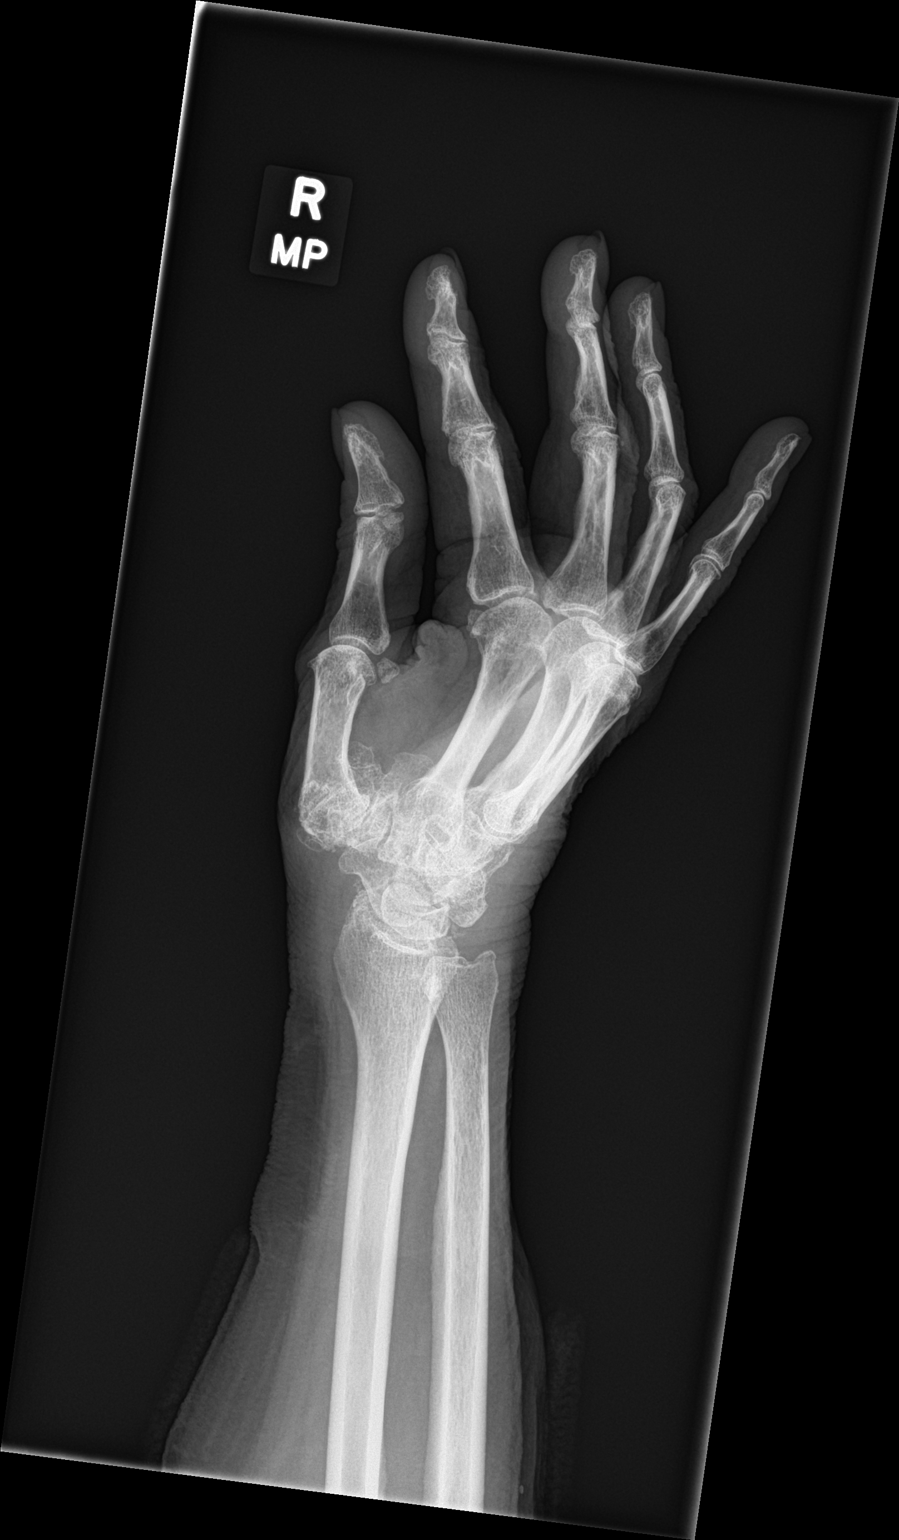

[hand lat]
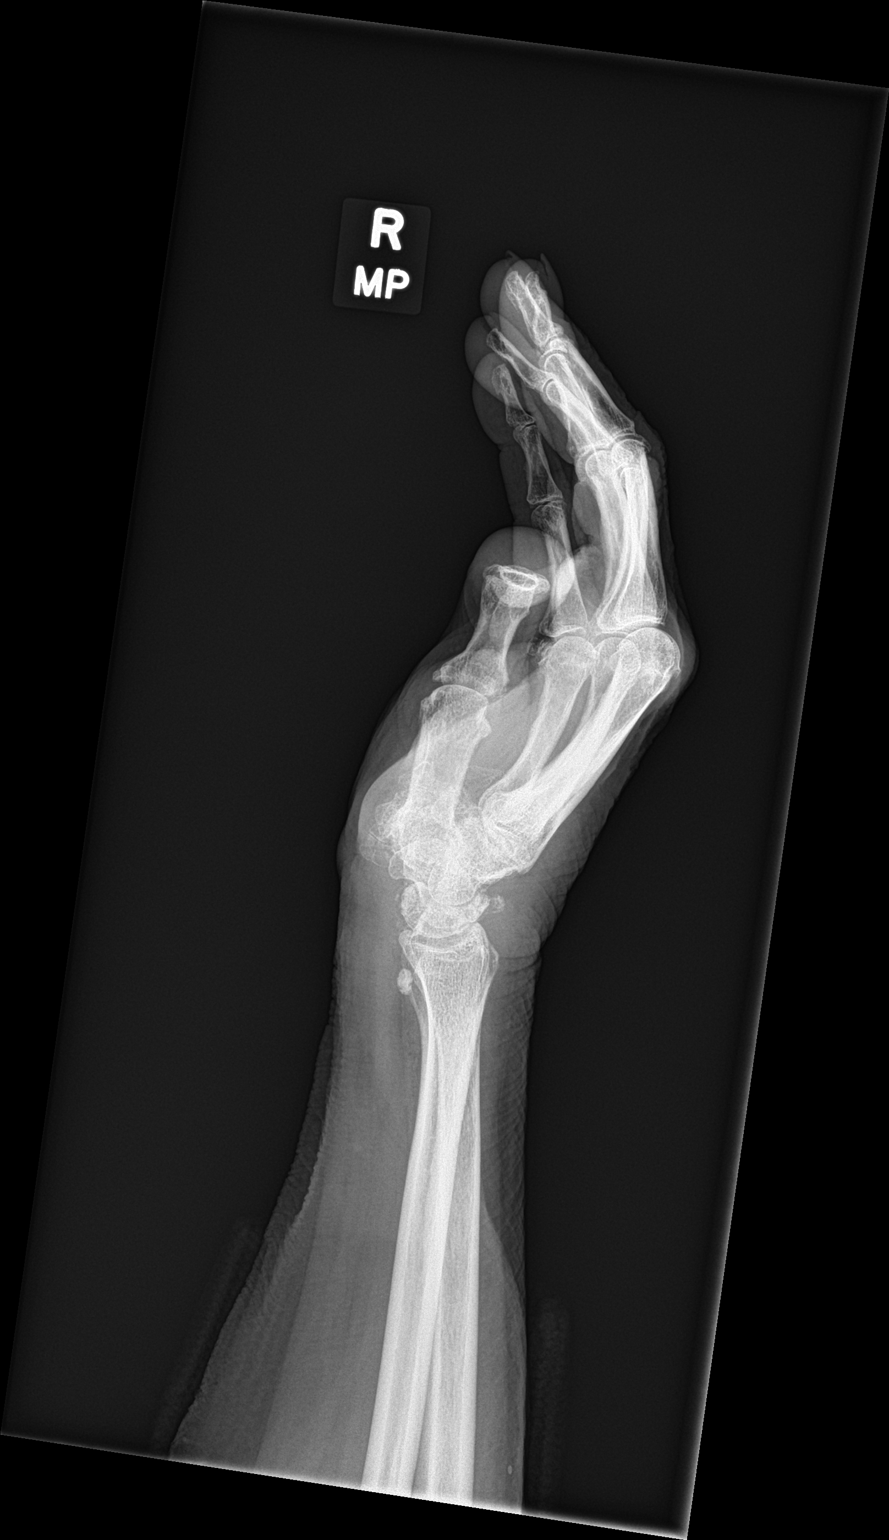

[3 of 3 positions shown; findings below may reference images not displayed]

FINDINGS: Subjective bony under mineralization. Advanced and progressive
osteoarthritis at the thumb carpal metacarpal joint with joint space
narrowing, sclerosis, subchondral cystic change in bony
fragmentation. Mild osteoarthritis of the digits and at the fifth
metacarpal phalangeal joint with mild progression. No fracture. No
erosion or bony destruction. There is chondrocalcinosis of the
triangular fibrocartilage. 6 mm calcification in the soft tissues
projecting over the volar aspect of the distal ulna is not
significantly changed from prior exam.
IMPRESSION: 1. Advanced and progressive osteoarthritis at the thumb
carpometacarpal joint. Mild osteoarthritis of the digits and fifth
metacarpophalangeal joint also with mild progression from prior
exam.
2. Chondrocalcinosis of the triangular fibrocartilage.
3. Chronic 6 mm soft tissue calcification projecting over the volar
aspect of the proximal ulna.

## 2024-03-06 ENCOUNTER — Ambulatory Visit (INDEPENDENT_AMBULATORY_CARE_PROVIDER_SITE_OTHER): Admitting: Family Medicine

## 2024-03-06 ENCOUNTER — Encounter: Payer: Self-pay | Admitting: Family Medicine

## 2024-03-06 ENCOUNTER — Other Ambulatory Visit: Payer: Self-pay | Admitting: Family Medicine

## 2024-03-06 DIAGNOSIS — E039 Hypothyroidism, unspecified: Secondary | ICD-10-CM

## 2024-03-06 DIAGNOSIS — M170 Bilateral primary osteoarthritis of knee: Secondary | ICD-10-CM

## 2024-03-06 LAB — LIPID PANEL

## 2024-03-06 MED ORDER — LEVOTHYROXINE SODIUM 150 MCG PO TABS: 150.0000 ug | ORAL_TABLET | Freq: Every day | ORAL | 1 refills | Status: DC

## 2024-03-06 MED ORDER — GABAPENTIN 600 MG PO TABS: 600.0000 mg | ORAL_TABLET | Freq: Two times a day (BID) | ORAL | 5 refills | Status: AC

## 2024-03-06 NOTE — Progress Notes (Signed)
 Subjective:  Patient ID: Erin Ewing, female    DOB: Jun 08, 1936  Age: 88 y.o. MRN: [id number]  CC: Medical Management of Chronic Issues   HPI  Discussed the use of AI scribe software for clinical note transcription with the patient, who gave verbal consent to proceed.  History of Present Illness Erin Ewing is an 88 year old female who presents with knee pain and difficulty ambulating.  She experiences knee pain that affects her ability to walk, necessitating the use of a walker. She recently resumed physical therapy after a hiatus of a couple of months, with the goal of transitioning out of her wheelchair. Her therapy includes balance and ambulation training three times a week.  For pain management, she uses diclofenac  gel, which is applied four times daily with the assistance of caregivers. She also takes duloxetine  and gabapentin .  She recently had a good eye exam and takes a 'brown pill' for her eyes. Although prescribed eye drops to be used three to four times daily, administration is inconsistent. Her vision, previously blurry, is now clear, and she reads frequently.  She experiences constipation and avoids cheese, believing it contributes to the issue. No hair loss or heart palpitations are reported, and she maintains a good energy level.  She resides at [facility], a care facility, and has been there for a couple of years.          03/06/2024    1:01 PM 03/06/2024   12:55 PM 02/15/2023   11:14 AM  Depression screen PHQ 2/9  Decreased Interest 0 0 0  Down, Depressed, Hopeless 0 0 0  PHQ - 2 Score 0 0 0  Altered sleeping 0    Tired, decreased energy 0    Change in appetite 0    Feeling bad or failure about yourself  0    Trouble concentrating 0    Moving slowly or fidgety/restless 0    Suicidal thoughts 0    PHQ-9 Score 0    Difficult doing work/chores Not difficult at all      History [name] has a past medical history of Arthritis, Hypertension,  Obesity, Polycythemia, Reflex sympathetic dystrophy (08/16/2012), and Subclinical hypothyroidism.   She has a past surgical history that includes ABDOMINAL AORTOGRAM W/LOWER EXTREMITY (N/A, 05/30/2017); PERIPHERAL VASCULAR INTERVENTION (Right, 05/30/2017); and PERIPHERAL VASCULAR ATHERECTOMY (Left, 05/30/2017).   Her family history includes Heart disease in her father.She reports that she has been smoking cigarettes. She has never used smokeless tobacco. She reports that she does not drink alcohol and does not use drugs.    ROS Review of Systems  Constitutional: Negative.   HENT: Negative.  Negative for congestion.   Eyes:  Negative for visual disturbance.  Respiratory:  Negative for shortness of breath.   Cardiovascular:  Negative for chest pain.  Gastrointestinal:  Negative for abdominal pain, constipation, diarrhea, nausea and vomiting.  Genitourinary:  Negative for difficulty urinating.  Musculoskeletal:  Positive for arthralgias. Negative for myalgias.  Neurological:  Negative for headaches.  Psychiatric/Behavioral:  Negative for sleep disturbance.     Objective:  BP 136/79   Pulse 63   Temp (!) 97.2 F (36.2 C)   Ht 5' 7 (1.702 m)   Wt 226 lb (102.5 kg)   SpO2 96%   BMI 35.40 kg/m   BP Readings from Last 3 Encounters:  03/06/24 136/79  08/21/23 124/72  03/29/23 130/65    Wt Readings from Last 3 Encounters:  03/06/24 226 lb (102.5 kg)  03/29/23  206 lb (93.4 kg)  04/19/22 206 lb (93.4 kg)     Physical Exam Constitutional:      General: She is not in acute distress.    Appearance: She is well-developed.  Cardiovascular:     Rate and Rhythm: Normal rate and regular rhythm.  Pulmonary:     Breath sounds: Normal breath sounds.  Musculoskeletal:        General: Normal range of motion.  Skin:    General: Skin is warm and dry.  Neurological:     Mental Status: She is alert and oriented to person, place, and time.      Assessment & Plan:  Arthritis of both  knees -     CBC with Differential/Platelet -     CMP14+EGFR -     Lipid panel -     Gabapentin ; Take 1 tablet (600 mg total) by mouth 2 (two) times daily.  Dispense: 60 tablet; Refill: 5  Hypothyroidism, unspecified type -     CBC with Differential/Platelet -     CMP14+EGFR -     Lipid panel -     TSH + free T4 -     Levothyroxine  Sodium; Take 1 tablet (150 mcg total) by mouth daily.  Dispense: 90 tablet; Refill: 1    Assessment and Plan Assessment & Plan Bilateral knee osteoarthritis   Chronic bilateral knee pain worsens with ambulation due to inconsistent diclofenac  gel application, reliant on caregivers. Ensure diclofenac  gel is applied to knees four times daily. Administer Tylenol  two pills three times daily with meals and at bedtime.  Constipation   Chronic constipation may be exacerbated by dietary habits, particularly cheese consumption. Advise limiting cheese intake.  Hypothyroidism   Hypothyroidism is well-managed with no new symptoms like hair loss or heart palpitations. Order blood work to assess thyroid  function.  General Health Maintenance   She is due for a tetanus shot. The recent eye exam was normal, but there is inconsistent administration of prescribed eye drops. Administer tetanus shot. Continue Preservision as prescribed. Discuss with eye doctor regarding inconsistent administration of prescribed eye drops.     Follow-up: Return in about 6 months (around 09/06/2024).  Butler Der, M.D.

## 2024-03-07 ENCOUNTER — Ambulatory Visit: Payer: Self-pay | Admitting: Family Medicine

## 2024-03-07 DIAGNOSIS — E039 Hypothyroidism, unspecified: Secondary | ICD-10-CM

## 2024-03-07 LAB — CBC WITH DIFFERENTIAL/PLATELET
Basophils Absolute: 0 x10E3/uL (ref 0.0–0.2)
Basos: 1 %
EOS (ABSOLUTE): 0.3 x10E3/uL (ref 0.0–0.4)
Eos: 3 %
Hematocrit: 46.9 % — ABNORMAL HIGH (ref 34.0–46.6)
Hemoglobin: 15 g/dL (ref 11.1–15.9)
Immature Grans (Abs): 0 x10E3/uL (ref 0.0–0.1)
Immature Granulocytes: 0 %
Lymphocytes Absolute: 2.3 x10E3/uL (ref 0.7–3.1)
Lymphs: 27 %
MCH: 30.3 pg (ref 26.6–33.0)
MCHC: 32 g/dL (ref 31.5–35.7)
MCV: 95 fL (ref 79–97)
Monocytes Absolute: 0.6 x10E3/uL (ref 0.1–0.9)
Monocytes: 7 %
Neutrophils Absolute: 5.5 x10E3/uL (ref 1.4–7.0)
Neutrophils: 62 %
Platelets: 267 x10E3/uL (ref 150–450)
RBC: 4.95 x10E6/uL (ref 3.77–5.28)
RDW: 13.7 % (ref 11.7–15.4)
WBC: 8.7 x10E3/uL (ref 3.4–10.8)

## 2024-03-07 LAB — CMP14+EGFR
ALT: 19 IU/L (ref 0–32)
AST: 16 IU/L (ref 0–40)
Albumin: 3.9 g/dL (ref 3.7–4.7)
Alkaline Phosphatase: 87 IU/L (ref 44–121)
BUN/Creatinine Ratio: 27 (ref 12–28)
BUN: 24 mg/dL (ref 8–27)
Bilirubin Total: 0.2 mg/dL (ref 0.0–1.2)
CO2: 26 mmol/L (ref 20–29)
Calcium: 10.7 mg/dL — AB (ref 8.7–10.3)
Chloride: 101 mmol/L (ref 96–106)
Creatinine, Ser: 0.9 mg/dL (ref 0.57–1.00)
Globulin, Total: 2.4 g/dL (ref 1.5–4.5)
Glucose: 104 mg/dL — AB (ref 70–99)
Potassium: 4.9 mmol/L (ref 3.5–5.2)
Sodium: 142 mmol/L (ref 134–144)
Total Protein: 6.3 g/dL (ref 6.0–8.5)
eGFR: 63 mL/min/1.73 (ref >59)

## 2024-03-07 LAB — LIPID PANEL
Cholesterol, Total: 159 mg/dL (ref 100–199)
HDL: 46 mg/dL (ref >39)
LDL CALC COMMENT:: 3.5 ratio (ref 0.0–4.4)
LDL Chol Calc (NIH): 78 mg/dL (ref 0–99)
Triglycerides: 210 mg/dL — AB (ref 0–149)
VLDL Cholesterol Cal: 35 mg/dL (ref 5–40)

## 2024-03-07 LAB — TSH+FREE T4
Free T4: 1.16 ng/dL (ref 0.82–1.77)
TSH: 6.25 u[IU]/mL — AB (ref 0.450–4.500)

## 2024-03-07 MED ORDER — LEVOTHYROXINE SODIUM 175 MCG PO TABS: 175.0000 ug | ORAL_TABLET | Freq: Every day | ORAL | 1 refills | Status: AC

## 2024-04-22 ENCOUNTER — Telehealth: Payer: Self-pay | Admitting: Family Medicine

## 2024-04-22 NOTE — Telephone Encounter (Signed)
 Copied from CRM [id number]. Topic: Clinical - Request for Lab/Test Order >> Apr 22, 2024 12:02 PM Delon DASEN wrote: Reason for CRM: Patient is asking if labs can be drawn in [place], please call Kyra at [facility] [phone]

## 2024-04-22 NOTE — Telephone Encounter (Signed)
 Called to let them know patient can get labs done in [place]

## 2024-04-25 ENCOUNTER — Other Ambulatory Visit

## 2024-04-25 ENCOUNTER — Telehealth: Payer: Self-pay

## 2024-04-25 NOTE — Telephone Encounter (Signed)
 Copied from CRM [id number]. Topic: Clinical - Request for Lab/Test Order >> Apr 25, 2024  2:19 PM Ivette P wrote: Reason for CRM: Restaurant manager, fast food from living facility said that they went to [place] to get labs done and order was not sent.   Pt was taken this morning and was not able to get labs drawn  [facility] requesting to have lab order sent so they can take pt get labs drawn.   telephone [phone]   Callback silvia -[phone]

## 2024-04-29 ENCOUNTER — Telehealth: Payer: Self-pay | Admitting: Family Medicine

## 2024-04-29 ENCOUNTER — Ambulatory Visit (INDEPENDENT_AMBULATORY_CARE_PROVIDER_SITE_OTHER): Admitting: Family Medicine

## 2024-04-29 ENCOUNTER — Encounter: Payer: Self-pay | Admitting: Family Medicine

## 2024-04-29 VITALS — BP 126/73 | HR 63 | Temp 97.8°F | Ht 67.0 in | Wt 230.0 lb

## 2024-04-29 DIAGNOSIS — E039 Hypothyroidism, unspecified: Secondary | ICD-10-CM | POA: Diagnosis not present

## 2024-04-29 DIAGNOSIS — Z23 Encounter for immunization: Secondary | ICD-10-CM

## 2024-04-29 NOTE — Telephone Encounter (Signed)
 Pt is in office now being seen with PCP, will have labs while in the office.

## 2024-04-29 NOTE — Progress Notes (Signed)
 Subjective:  Patient ID: Erin Ewing, female    DOB: 03-29-1936  Age: 88 y.o. MRN: [id number]  CC: Medical Management of Chronic Issues (Thyroid  check)   HPI  Discussed the use of AI scribe software for clinical note transcription with the patient, who gave verbal consent to proceed.  History of Present Illness Erin Ewing is an 88 year old female with hypothyroidism who presents for follow-up regarding her levothyroxine  dosage.  She has been taking a new dose of levothyroxine  at 175 mcg daily for a couple of months with no side effects such as jitteriness, hyperactivity, heart palpitations, or diarrhea. Energy levels are satisfactory, although she mentions feeling bored.  She is currently taking duloxetine  for mood stabilization and reports no issues with depression or anxiety. She is unsure of the specific medications she is taking but confirms adherence to her prescribed regimen.  She mentions a history of having her medications delivered when she had a home, but now she no longer has a home. She reads extensively and is considering learning to crochet to address her boredom.          04/29/2024    1:20 PM 03/06/2024    1:01 PM 03/06/2024   12:55 PM  Depression screen PHQ 2/9  Decreased Interest 0 0 0  Down, Depressed, Hopeless 0 0 0  PHQ - 2 Score 0 0 0  Altered sleeping  0   Tired, decreased energy  0   Change in appetite  0   Feeling bad or failure about yourself   0   Trouble concentrating  0   Moving slowly or fidgety/restless  0   Suicidal thoughts  0   PHQ-9 Score  0   Difficult doing work/chores  Not difficult at all     History Erin Ewing has a past medical history of Arthritis, Hypertension, Obesity, Polycythemia, Reflex sympathetic dystrophy (08/16/2012), and Subclinical hypothyroidism.   She has a past surgical history that includes ABDOMINAL AORTOGRAM W/LOWER EXTREMITY (N/A, 05/30/2017); PERIPHERAL VASCULAR INTERVENTION (Right, 05/30/2017); and  PERIPHERAL VASCULAR ATHERECTOMY (Left, 05/30/2017).   Her family history includes Heart disease in her father.She reports that she has been smoking cigarettes. She has never used smokeless tobacco. She reports that she does not drink alcohol and does not use drugs.    ROS Review of Systems  Objective:  BP 126/73   Pulse 63   Temp 97.8 F (36.6 C)   Ht 5' 7 (1.702 m)   Wt 230 lb (104.3 kg)   SpO2 94%   BMI 36.02 kg/m   BP Readings from Last 3 Encounters:  04/29/24 126/73  03/06/24 136/79  08/21/23 124/72    Wt Readings from Last 3 Encounters:  04/29/24 230 lb (104.3 kg)  03/06/24 226 lb (102.5 kg)  03/29/23 206 lb (93.4 kg)     Physical Exam Physical Exam GENERAL: Alert, cooperative, well developed, no acute distress HEENT: Normocephalic, normal oropharynx, moist mucous membranes CHEST: Clear to auscultation bilaterally, no wheezes, rhonchi, or crackles CARDIOVASCULAR: Normal heart rate and rhythm, S1 and S2 normal without murmurs ABDOMEN: Soft, non-tender, non-distended, without organomegaly, normal bowel sounds EXTREMITIES: No cyanosis or edema. Wheel chair bound NEUROLOGICAL: Cranial nerves grossly intact, moves all extremities without gross motor or sensory deficit   Assessment & Plan:  Hypothyroidism, unspecified type -     TSH + free T4  Encounter for immunization -     Flu vaccine HIGH DOSE PF(Fluzone Trivalent)    Assessment and Plan Assessment &  Plan Hypothyroidism   Hypothyroidism is well-managed with levothyroxine  175 mcg. She experiences no side effects such as jitteriness, heart palpitations, or diarrhea. Energy levels are satisfactory. Blood work is pending to assess thyroid  function. Order blood work to assess thyroid  function.  Immunization   Immunization status is updated with a recent vaccine administration. Documentation is required for records. Ensure documentation of the recent immunization in medical records.  General Health  Maintenance   A routine follow-up is planned to monitor overall health and manage chronic conditions. Schedule a follow-up appointment in six months to check blood pressure, cholesterol, and other general health parameters.       Follow-up: Return in about 6 months (around 10/28/2024) for Hypothyroidism, hypertension.  Butler Der, M.D.

## 2024-04-29 NOTE — Telephone Encounter (Signed)
 Copied from CRM [id number]. Topic: Clinical - Request for Lab/Test Order >> Apr 25, 2024  2:19 PM Ivette P wrote: Reason for CRM: Restaurant manager, fast food from living facility said that they went to [place] to get labs done and order was not sent.   Pt was taken this morning and was not able to get labs drawn  [facility] requesting to have lab order sent so they can take pt get labs drawn.   telephone [phone]   Mitch auston [phone] >> Apr 29, 2024  9:28 AM Alfonso ORN wrote: kyra from  [facility] - checking on status that patient has not had her labs yet and want to know if patient should come to her appt today 04/29/24 at  12:55 - kyra callback  [phone]

## 2024-04-29 NOTE — Telephone Encounter (Signed)
 [facility] calling to check and see if patient needs to come to her appt today @ 12:55. Please call.

## 2024-04-29 NOTE — Telephone Encounter (Signed)
 Patient seen in office

## 2024-04-30 ENCOUNTER — Ambulatory Visit: Payer: Self-pay | Admitting: Family Medicine

## 2024-04-30 LAB — TSH+FREE T4
Free T4: 1.32 ng/dL (ref 0.82–1.77)
TSH: 1.83 u[IU]/mL (ref 0.450–4.500)

## 2024-04-30 NOTE — Progress Notes (Signed)
Hello Erin Ewing, ? ?Your lab result is normal and/or stable.Some minor variations that are not significant are commonly marked abnormal, but do not represent any medical problem for you. ? ?Best regards, ?Yader Criger, M.D.

## 2024-09-09 ENCOUNTER — Ambulatory Visit: Admitting: Family Medicine

## 2024-10-28 ENCOUNTER — Ambulatory Visit: Payer: Self-pay | Admitting: Family Medicine
